# Patient Record
Sex: Male | Born: 1950 | Race: White | Hispanic: No | Marital: Single | Smoking: Former smoker
Health system: Southern US, Community
[De-identification: ages and names within clinical notes are randomized; demographics above are authoritative.]

## PROBLEM LIST (undated history)

## (undated) DIAGNOSIS — F329 Major depressive disorder, single episode, unspecified: Secondary | ICD-10-CM

## (undated) DIAGNOSIS — Z87442 Personal history of urinary calculi: Secondary | ICD-10-CM

## (undated) DIAGNOSIS — L2089 Other atopic dermatitis: Secondary | ICD-10-CM

## (undated) DIAGNOSIS — M545 Low back pain: Secondary | ICD-10-CM

## (undated) DIAGNOSIS — N259 Disorder resulting from impaired renal tubular function, unspecified: Secondary | ICD-10-CM

## (undated) DIAGNOSIS — N400 Enlarged prostate without lower urinary tract symptoms: Secondary | ICD-10-CM

## (undated) DIAGNOSIS — E785 Hyperlipidemia, unspecified: Secondary | ICD-10-CM

## (undated) DIAGNOSIS — R05 Cough: Secondary | ICD-10-CM

## (undated) DIAGNOSIS — R110 Nausea: Secondary | ICD-10-CM

## (undated) DIAGNOSIS — I10 Essential (primary) hypertension: Secondary | ICD-10-CM

## (undated) DIAGNOSIS — I639 Cerebral infarction, unspecified: Secondary | ICD-10-CM

## (undated) DIAGNOSIS — M79604 Pain in right leg: Secondary | ICD-10-CM

## (undated) DIAGNOSIS — G4700 Insomnia, unspecified: Secondary | ICD-10-CM

## (undated) DIAGNOSIS — R931 Abnormal findings on diagnostic imaging of heart and coronary circulation: Secondary | ICD-10-CM

## (undated) DIAGNOSIS — L80 Vitiligo: Secondary | ICD-10-CM

## (undated) DIAGNOSIS — Z860100 Personal history of colon polyps, unspecified: Secondary | ICD-10-CM

## (undated) DIAGNOSIS — G459 Transient cerebral ischemic attack, unspecified: Secondary | ICD-10-CM

## (undated) DIAGNOSIS — H269 Unspecified cataract: Secondary | ICD-10-CM

## (undated) DIAGNOSIS — M25569 Pain in unspecified knee: Secondary | ICD-10-CM

## (undated) DIAGNOSIS — E042 Nontoxic multinodular goiter: Secondary | ICD-10-CM

## (undated) DIAGNOSIS — I340 Nonrheumatic mitral (valve) insufficiency: Secondary | ICD-10-CM

## (undated) DIAGNOSIS — T7840XA Allergy, unspecified, initial encounter: Secondary | ICD-10-CM

## (undated) DIAGNOSIS — N183 Chronic kidney disease, stage 3 unspecified: Secondary | ICD-10-CM

## (undated) DIAGNOSIS — Z8619 Personal history of other infectious and parasitic diseases: Secondary | ICD-10-CM

## (undated) DIAGNOSIS — I7090 Unspecified atherosclerosis: Secondary | ICD-10-CM

## (undated) DIAGNOSIS — R109 Unspecified abdominal pain: Secondary | ICD-10-CM

## (undated) DIAGNOSIS — R739 Hyperglycemia, unspecified: Secondary | ICD-10-CM

## (undated) DIAGNOSIS — K5730 Diverticulosis of large intestine without perforation or abscess without bleeding: Secondary | ICD-10-CM

## (undated) DIAGNOSIS — M5412 Radiculopathy, cervical region: Secondary | ICD-10-CM

## (undated) DIAGNOSIS — M1990 Unspecified osteoarthritis, unspecified site: Secondary | ICD-10-CM

## (undated) DIAGNOSIS — J309 Allergic rhinitis, unspecified: Secondary | ICD-10-CM

## (undated) DIAGNOSIS — R6882 Decreased libido: Secondary | ICD-10-CM

## (undated) DIAGNOSIS — K649 Unspecified hemorrhoids: Secondary | ICD-10-CM

## (undated) DIAGNOSIS — R634 Abnormal weight loss: Secondary | ICD-10-CM

## (undated) HISTORY — DX: Chronic kidney disease, stage 3 unspecified: N18.30

## (undated) HISTORY — DX: Personal history of urinary calculi: Z87.442

## (undated) HISTORY — DX: Unspecified cataract: H26.9

## (undated) HISTORY — DX: Unspecified osteoarthritis, unspecified site: M19.90

## (undated) HISTORY — DX: Allergic rhinitis, unspecified: J30.9

## (undated) HISTORY — DX: Allergy, unspecified, initial encounter: T78.40XA

## (undated) HISTORY — DX: Cerebral infarction, unspecified: I63.9

## (undated) HISTORY — DX: Essential (primary) hypertension: I10

## (undated) HISTORY — DX: Cough: R05

## (undated) HISTORY — DX: Hyperglycemia, unspecified: R73.9

## (undated) HISTORY — DX: Decreased libido: R68.82

## (undated) HISTORY — DX: Benign prostatic hyperplasia without lower urinary tract symptoms: N40.0

## (undated) HISTORY — DX: Diverticulosis of large intestine without perforation or abscess without bleeding: K57.30

## (undated) HISTORY — DX: Other atopic dermatitis: L20.89

## (undated) HISTORY — DX: Unspecified atherosclerosis: I70.90

## (undated) HISTORY — DX: Disorder resulting from impaired renal tubular function, unspecified: N25.9

## (undated) HISTORY — PX: COLONOSCOPY: SHX174

## (undated) HISTORY — DX: Insomnia, unspecified: G47.00

## (undated) HISTORY — DX: Pain in unspecified knee: M25.569

## (undated) HISTORY — DX: Nonrheumatic mitral (valve) insufficiency: I34.0

## (undated) HISTORY — DX: Personal history of other infectious and parasitic diseases: Z86.19

## (undated) HISTORY — DX: Nontoxic multinodular goiter: E04.2

## (undated) HISTORY — DX: Radiculopathy, cervical region: M54.12

## (undated) HISTORY — PX: OTHER SURGICAL HISTORY: SHX169

## (undated) HISTORY — DX: Hyperlipidemia, unspecified: E78.5

## (undated) HISTORY — DX: Abnormal weight loss: R63.4

## (undated) HISTORY — DX: Major depressive disorder, single episode, unspecified: F32.9

## (undated) HISTORY — DX: Nausea: R11.0

## (undated) HISTORY — PX: POLYPECTOMY: SHX149

## (undated) HISTORY — DX: Low back pain: M54.5

## (undated) HISTORY — DX: Unspecified hemorrhoids: K64.9

## (undated) HISTORY — PX: TONSILLECTOMY: SHX5217

## (undated) HISTORY — DX: Vitiligo: L80

## (undated) HISTORY — DX: Personal history of colon polyps, unspecified: Z86.0100

## (undated) HISTORY — DX: Pain in right leg: M79.604

## (undated) HISTORY — DX: Personal history of colonic polyps: Z86.010

## (undated) HISTORY — DX: Abnormal findings on diagnostic imaging of heart and coronary circulation: R93.1

## (undated) HISTORY — DX: Unspecified abdominal pain: R10.9

---

## 2001-03-13 ENCOUNTER — Ambulatory Visit (HOSPITAL_COMMUNITY): Admission: RE | Admit: 2001-03-13 | Discharge: 2001-03-13 | Payer: Self-pay | Admitting: Internal Medicine

## 2001-03-13 ENCOUNTER — Encounter: Payer: Self-pay | Admitting: Internal Medicine

## 2001-12-03 ENCOUNTER — Emergency Department (HOSPITAL_COMMUNITY): Admission: EM | Admit: 2001-12-03 | Discharge: 2001-12-03 | Payer: Self-pay | Admitting: Emergency Medicine

## 2001-12-03 ENCOUNTER — Encounter: Payer: Self-pay | Admitting: Urology

## 2001-12-05 ENCOUNTER — Encounter: Payer: Self-pay | Admitting: Urology

## 2001-12-05 ENCOUNTER — Ambulatory Visit (HOSPITAL_BASED_OUTPATIENT_CLINIC_OR_DEPARTMENT_OTHER): Admission: RE | Admit: 2001-12-05 | Discharge: 2001-12-05 | Payer: Self-pay | Admitting: Urology

## 2002-11-07 ENCOUNTER — Encounter: Payer: Self-pay | Admitting: Urology

## 2002-11-07 ENCOUNTER — Ambulatory Visit (HOSPITAL_BASED_OUTPATIENT_CLINIC_OR_DEPARTMENT_OTHER): Admission: RE | Admit: 2002-11-07 | Discharge: 2002-11-07 | Payer: Self-pay | Admitting: Urology

## 2004-09-02 ENCOUNTER — Ambulatory Visit: Payer: Self-pay | Admitting: Internal Medicine

## 2005-02-16 ENCOUNTER — Ambulatory Visit: Payer: Self-pay | Admitting: Internal Medicine

## 2005-05-25 ENCOUNTER — Ambulatory Visit: Payer: Self-pay | Admitting: Internal Medicine

## 2005-12-15 ENCOUNTER — Ambulatory Visit: Payer: Self-pay | Admitting: Internal Medicine

## 2006-06-27 ENCOUNTER — Ambulatory Visit: Payer: Self-pay | Admitting: Internal Medicine

## 2006-06-29 ENCOUNTER — Ambulatory Visit: Payer: Self-pay | Admitting: Cardiology

## 2006-07-18 ENCOUNTER — Ambulatory Visit: Payer: Self-pay | Admitting: Internal Medicine

## 2006-09-01 ENCOUNTER — Encounter (INDEPENDENT_AMBULATORY_CARE_PROVIDER_SITE_OTHER): Payer: Self-pay | Admitting: *Deleted

## 2006-09-01 ENCOUNTER — Ambulatory Visit: Payer: Self-pay | Admitting: Internal Medicine

## 2007-04-06 ENCOUNTER — Ambulatory Visit: Payer: Self-pay | Admitting: Internal Medicine

## 2007-04-08 DIAGNOSIS — J309 Allergic rhinitis, unspecified: Secondary | ICD-10-CM

## 2007-04-08 DIAGNOSIS — G4700 Insomnia, unspecified: Secondary | ICD-10-CM

## 2007-04-08 DIAGNOSIS — F329 Major depressive disorder, single episode, unspecified: Secondary | ICD-10-CM

## 2007-04-08 DIAGNOSIS — F32A Depression, unspecified: Secondary | ICD-10-CM

## 2007-04-08 HISTORY — DX: Depression, unspecified: F32.A

## 2007-04-08 HISTORY — DX: Allergic rhinitis, unspecified: J30.9

## 2007-04-08 HISTORY — DX: Insomnia, unspecified: G47.00

## 2007-05-02 ENCOUNTER — Ambulatory Visit: Payer: Self-pay | Admitting: Internal Medicine

## 2007-08-03 ENCOUNTER — Telehealth (INDEPENDENT_AMBULATORY_CARE_PROVIDER_SITE_OTHER): Payer: Self-pay | Admitting: *Deleted

## 2007-08-05 DIAGNOSIS — N400 Enlarged prostate without lower urinary tract symptoms: Secondary | ICD-10-CM

## 2007-08-05 DIAGNOSIS — Z87442 Personal history of urinary calculi: Secondary | ICD-10-CM

## 2007-08-05 DIAGNOSIS — Z860100 Personal history of colon polyps, unspecified: Secondary | ICD-10-CM

## 2007-08-05 DIAGNOSIS — K5730 Diverticulosis of large intestine without perforation or abscess without bleeding: Secondary | ICD-10-CM

## 2007-08-05 DIAGNOSIS — E785 Hyperlipidemia, unspecified: Secondary | ICD-10-CM | POA: Insufficient documentation

## 2007-08-05 HISTORY — DX: Personal history of urinary calculi: Z87.442

## 2007-08-05 HISTORY — DX: Diverticulosis of large intestine without perforation or abscess without bleeding: K57.30

## 2007-08-05 HISTORY — DX: Personal history of colon polyps, unspecified: Z86.0100

## 2007-08-05 HISTORY — DX: Benign prostatic hyperplasia without lower urinary tract symptoms: N40.0

## 2007-08-05 HISTORY — DX: Personal history of colonic polyps: Z86.010

## 2007-11-20 ENCOUNTER — Ambulatory Visit: Payer: Self-pay | Admitting: Internal Medicine

## 2007-11-29 ENCOUNTER — Encounter: Payer: Self-pay | Admitting: Internal Medicine

## 2008-01-01 ENCOUNTER — Encounter: Payer: Self-pay | Admitting: Internal Medicine

## 2008-05-01 ENCOUNTER — Encounter: Payer: Self-pay | Admitting: Internal Medicine

## 2008-07-10 ENCOUNTER — Ambulatory Visit: Payer: Self-pay | Admitting: Internal Medicine

## 2008-07-10 DIAGNOSIS — K649 Unspecified hemorrhoids: Secondary | ICD-10-CM

## 2008-07-10 DIAGNOSIS — M545 Low back pain, unspecified: Secondary | ICD-10-CM

## 2008-07-10 DIAGNOSIS — M25569 Pain in unspecified knee: Secondary | ICD-10-CM

## 2008-07-10 HISTORY — DX: Unspecified hemorrhoids: K64.9

## 2008-07-10 HISTORY — DX: Pain in unspecified knee: M25.569

## 2008-07-10 HISTORY — DX: Low back pain, unspecified: M54.50

## 2008-11-18 ENCOUNTER — Encounter: Payer: Self-pay | Admitting: Internal Medicine

## 2009-05-14 ENCOUNTER — Telehealth: Payer: Self-pay | Admitting: Internal Medicine

## 2009-05-18 ENCOUNTER — Ambulatory Visit: Payer: Self-pay | Admitting: Internal Medicine

## 2009-06-26 ENCOUNTER — Encounter: Payer: Self-pay | Admitting: Internal Medicine

## 2009-07-13 ENCOUNTER — Ambulatory Visit: Payer: Self-pay | Admitting: Internal Medicine

## 2009-07-13 LAB — HM COLONOSCOPY

## 2009-07-14 ENCOUNTER — Encounter (INDEPENDENT_AMBULATORY_CARE_PROVIDER_SITE_OTHER): Payer: Self-pay | Admitting: *Deleted

## 2009-07-22 ENCOUNTER — Telehealth: Payer: Self-pay | Admitting: Internal Medicine

## 2009-07-22 ENCOUNTER — Encounter: Payer: Self-pay | Admitting: Internal Medicine

## 2009-11-12 ENCOUNTER — Ambulatory Visit: Payer: Self-pay | Admitting: Internal Medicine

## 2009-11-12 DIAGNOSIS — L2089 Other atopic dermatitis: Secondary | ICD-10-CM

## 2009-11-12 DIAGNOSIS — R05 Cough: Secondary | ICD-10-CM

## 2009-11-12 DIAGNOSIS — R059 Cough, unspecified: Secondary | ICD-10-CM

## 2009-11-12 HISTORY — DX: Other atopic dermatitis: L20.89

## 2009-11-12 HISTORY — DX: Cough, unspecified: R05.9

## 2009-11-12 LAB — CONVERTED CEMR LAB: Rapid Strep: NEGATIVE

## 2010-01-07 ENCOUNTER — Encounter: Payer: Self-pay | Admitting: Internal Medicine

## 2010-01-28 ENCOUNTER — Telehealth: Payer: Self-pay | Admitting: Internal Medicine

## 2010-02-18 ENCOUNTER — Encounter: Payer: Self-pay | Admitting: Internal Medicine

## 2010-02-19 ENCOUNTER — Telehealth: Payer: Self-pay | Admitting: Internal Medicine

## 2010-06-30 ENCOUNTER — Telehealth: Payer: Self-pay | Admitting: Internal Medicine

## 2010-08-26 ENCOUNTER — Encounter: Payer: Self-pay | Admitting: Internal Medicine

## 2010-09-07 ENCOUNTER — Encounter: Payer: Self-pay | Admitting: Internal Medicine

## 2010-09-07 ENCOUNTER — Ambulatory Visit: Payer: Self-pay | Admitting: Internal Medicine

## 2010-09-07 DIAGNOSIS — N259 Disorder resulting from impaired renal tubular function, unspecified: Secondary | ICD-10-CM

## 2010-09-07 HISTORY — DX: Disorder resulting from impaired renal tubular function, unspecified: N25.9

## 2010-10-11 ENCOUNTER — Ambulatory Visit (HOSPITAL_COMMUNITY)
Admission: RE | Admit: 2010-10-11 | Discharge: 2010-10-11 | Payer: Self-pay | Source: Home / Self Care | Attending: Urology | Admitting: Urology

## 2010-10-13 ENCOUNTER — Encounter: Payer: Self-pay | Admitting: Internal Medicine

## 2010-10-26 NOTE — Progress Notes (Signed)
  Phone Note From Pharmacy   [facility] Summary of Call: Received PA approval letter  for pravastatin stating that rx approved 02/18/10-03/20/10 Initial call taken by: Rock Nephew CMA,  Feb 19, 2010 10:00 AM

## 2010-10-26 NOTE — Assessment & Plan Note (Signed)
Summary: REQUEST AM APPT-FEVER X 3 DYS-DR [facility] PT-[facility]   Vital Signs:  Patient profile:   60 year old male Height:      67 inches Weight:      161 pounds BMI:     25.31 O2 Sat:      98 % on Room air Temp:     100.1 degrees F oral Pulse rate:   86 / minute Pulse rhythm:   regular Resp:     16 per minute BP sitting:   108 / 64  (left arm) Cuff size:   large  Vitals Entered By: Rock Nephew CMA (November 12, 2009 8:49 AM)  Nutrition Counseling: Patient's BMI is greater than 25 and therefore counseled on weight management options.  O2 Flow:  Room air CC: fever x 3 days w/ sore throat, URI symptoms Is Patient Diabetic? No Pain Assessment Patient in pain? yes     Location: throat   Primary Care Provider:  Corwin Levins MD  CC:  fever x 3 days w/ sore throat and URI symptoms.  History of Present Illness:  URI Symptoms      This is a right-handed patient who presents with URI symptoms.  The symptoms began 4 days ago.  The severity is described as moderate.  The patient reports sore throat, dry cough, and sick contacts, but denies nasal congestion, clear nasal discharge, purulent nasal discharge, productive cough, and earache.  Associated symptoms include fever of 100.5-103 degrees.  The patient denies stiff neck, dyspnea, wheezing, rash, vomiting, diarrhea, and use of an antipyretic.  The patient denies headache, muscle aches, and severe fatigue.  The patient denies the following risk factors for Strep sinusitis: unilateral facial pain, unilateral nasal discharge, double sickening, Strep exposure, tender adenopathy, and absence of cough.    He has not been taking allegra and has persistent rash/itching.  Preventive Screening-Counseling & Management  Alcohol-Tobacco     Alcohol drinks/day: 0     Smoking Status: never  Caffeine-Diet-Exercise     Does Patient Exercise: no  Hep-HIV-STD-Contraception     Hepatitis Risk: no risk noted     HIV Risk: no risk noted     STD Risk:  no risk noted      Drug Use:  never.    Medications Prior to Update: 1)  Fexofenadine Hcl 180 Mg  Tabs (Fexofenadine Hcl) .Marland Kitchen.. 1 By Mouth Once Daily Prn 2)  Fluticasone Propionate 50 Mcg/act  Susp (Fluticasone Propionate) 3)  Fluocinonide 0.05 % Crea (Fluocinonide) .... Use Asd Two Times A Day As Needed 4)  Pravastatin Sodium 80 Mg Tabs (Pravastatin Sodium) .Marland Kitchen.. 1po Once Daily 5)  Metrogel 1 % Gel (Metronidazole) .... Use Asd Once Daily To Face As Needed 6)  Adult Aspirin Ec Low Strength 81 Mg Tbec (Aspirin) .Marland Kitchen.. 1 By Mouth Once Daily 7)  Anusol-Hc 25 Mg Supp (Hydrocortisone Acetate) .Marland Kitchen.. 1 Pr Two Times A Day 8)  Atopiclair  Crea (Emollient) .... Apply To Rash Two Times A Day 9)  Hydrocortisone 1 % Crea (Hydrocortisone) .... (Otc) Use Asd On Box 10)  Multivitamin .... Daily 11)  Clobetasol Propionate 0.05 % Soln (Clobetasol Propionate) .... Use Foam As Needed To Rash 12)  Aloquin 1.25-1 % Gel (Iodoquinol-Aloe Polysaccharide) .... As Needed Rash  Current Medications (verified): 1)  Fexofenadine Hcl 180 Mg  Tabs (Fexofenadine Hcl) .Marland Kitchen.. 1 By Mouth Once Daily Prn 2)  Fluticasone Propionate 50 Mcg/act  Susp (Fluticasone Propionate) 3)  Fluocinonide 0.05 % Crea (Fluocinonide) .... Use  Asd Two Times A Day As Needed 4)  Pravastatin Sodium 80 Mg Tabs (Pravastatin Sodium) .Marland Kitchen.. 1po Once Daily 5)  Metrogel 1 % Gel (Metronidazole) .... Use Asd Once Daily To Face As Needed 6)  Adult Aspirin Ec Low Strength 81 Mg Tbec (Aspirin) .Marland Kitchen.. 1 By Mouth Once Daily 7)  Atopiclair  Crea (Emollient) .... Apply To Rash Two Times A Day 8)  Multivitamin .... Daily 9)  Clobetasol Propionate 0.05 % Soln (Clobetasol Propionate) .... Use Foam As Needed To Rash  Allergies (verified): 1)  ! Adhesive Tape 2)  Lipitor 3)  Zocor 4)  * Crestor  Past History:  Past Medical History: Reviewed history from 08/05/2007 and no changes required. Depression Insomnia Allergic rhinitis Hyperlipidemia Nephrolithiasis, hx  of Benign prostatic hypertrophy Diverticulosis, colon Colonic polyps, hx of  Past Surgical History: Reviewed history from 07/13/2009 and no changes required. Tonsillectomy s/p lithotrypsy  Family History: Reviewed history from 08/05/2007 and no changes required. sister with ulcerative colitis grandparents with heart disease Family History of CAD Male 1st degree relative Family History High cholesterol Family History Hypertension  Social History: Reviewed history from 07/13/2009 and no changes required. Former Smoker work - [employer] Divorced no biological children Alcohol use-no Regular exercise-no Hepatitis Risk:  no risk noted HIV Risk:  no risk noted STD Risk:  no risk noted Drug Use:  never Smoking Status:  never Does Patient Exercise:  no  Review of Systems       The patient complains of fever.  The patient denies anorexia, weight loss, chest pain, headaches, hemoptysis, abdominal pain, melena, hematochezia, severe indigestion/heartburn, enlarged lymph nodes, and angioedema.   Derm:  Complains of itching and rash; denies dryness, flushing, hair loss, insect bite(s), lesion(s), and poor wound healing.  Physical Exam  General:  alert and well-developed.  well-nourished, well-hydrated, appropriate dress, and normal appearance.   Ears:  R ear normal and L ear normal.   Nose:  External nasal examination shows no deformity or inflammation. Nasal mucosa are pink and moist without lesions or exudates. Mouth:  no exudates, no posterior lymphoid hypertrophy, no postnasal drip, no pharyngeal crowing, no lesions, no aphthous ulcers, no erosions, no tongue abnormalities, no leukoplakia, no petechiae, and pharyngeal erythema.   Neck:  supple, full ROM, no masses, no thyromegaly, no JVD, no carotid bruits, and no cervical lymphadenopathy.   Lungs:  Normal respiratory effort, chest expands symmetrically. Lungs are clear to auscultation, no crackles or wheezes. Heart:  Normal rate and  regular rhythm. S1 and S2 normal without gallop, murmur, click, rub or other extra sounds. Abdomen:  soft, non-tender, normal bowel sounds, no distention, no masses, no guarding, no rigidity, no rebound tenderness, no hepatomegaly, and no splenomegaly.   Msk:  No deformity or scoliosis noted of thoracic or lumbar spine.   Pulses:  R and L carotid,radial,femoral,dorsalis pedis and posterior tibial pulses are full and equal bilaterally Extremities:  No clubbing, cyanosis, edema, or deformity noted with normal full range of motion of all joints.   Neurologic:  No cranial nerve deficits noted. Station and gait are normal. Plantar reflexes are down-going bilaterally. DTRs are symmetrical throughout. Sensory, motor and coordinative functions appear intact. Skin:  he has coalesced papules with scale across torso and upper extremities with no plaques, macules, targets, vesicles,  there is some dermagraphism and lichenification. Cervical Nodes:  no anterior cervical adenopathy and no posterior cervical adenopathy.   Axillary Nodes:  no R axillary adenopathy and no L axillary adenopathy.   Inguinal  Nodes:  no R inguinal adenopathy and no L inguinal adenopathy.     Impression & Recommendations:  Problem # 1:  ECZEMA, ATOPIC (ICD-691.8) Assessment New restart allegra The following medications were removed from the medication list:    Hydrocortisone 1 % Crea (Hydrocortisone) ..... (otc) use asd on box His updated medication list for this problem includes:    Fluocinonide 0.05 % Crea (Fluocinonide) ..... Use asd two times a day as needed    Atopiclair Crea (Emollient) .Marland Kitchen... Apply to rash two times a day    Clobetasol Propionate 0.05 % Soln (Clobetasol propionate) ..... Use foam as needed to rash  Discussed use of medication and avoidance of irritating agents. Also stressed importance of moisturizers.   Problem # 2:  FEVER (ICD-780.60) Assessment: New  Orders: Rapid Strep (40981)  Discussed fever  control and symptomatic treatment.   Problem # 3:  PHARYNGITIS-ACUTE (ICD-462) Assessment: New will cover strep His updated medication list for this problem includes:    Adult Aspirin Ec Low Strength 81 Mg Tbec (Aspirin) .Marland Kitchen... 1 by mouth once daily    Zithromax Tri-pak 500 Mg Tab (Azithromycin) .Marland Kitchen... Take one by mouth once daily for 3 days  Orders: Rapid Strep (19147)  Problem # 4:  COUGH (ICD-786.2) Assessment: New will look for pna but I think this is viral Orders: T-2 View CXR (71020TC)  Complete Medication List: 1)  Fexofenadine Hcl 180 Mg Tabs (Fexofenadine hcl) .Marland Kitchen.. 1 by mouth once daily prn 2)  Fluticasone Propionate 50 Mcg/act Susp (Fluticasone propionate) 3)  Fluocinonide 0.05 % Crea (Fluocinonide) .... Use asd two times a day as needed 4)  Pravastatin Sodium 80 Mg Tabs (Pravastatin sodium) .Marland Kitchen.. 1po once daily 5)  Metrogel 1 % Gel (Metronidazole) .... Use asd once daily to face as needed 6)  Adult Aspirin Ec Low Strength 81 Mg Tbec (Aspirin) .Marland Kitchen.. 1 by mouth once daily 7)  Atopiclair Crea (Emollient) .... Apply to rash two times a day 8)  Multivitamin  .... Daily 9)  Clobetasol Propionate 0.05 % Soln (Clobetasol propionate) .... Use foam as needed to rash 10)  Zithromax Tri-pak 500 Mg Tab (Azithromycin) .... Take one by mouth once daily for 3 days 11)  Guiatuss Ac 100-10 Mg/7ml Syrp (Guaifenesin-codeine) .... 5-10 ml by mouth qid as needed for cough  Patient Instructions: 1)  Please schedule a follow-up appointment in 2 weeks. 2)  Get plenty of rest, drink lots of clear liquids, and use Tylenol or Ibuprofen for fever and comfort. Return in 7-10 days if you're not better:sooner if you're feeling worse. 3)  Take over the counter cough and cold medications only as directed on the package insert. DO NOT take more than recommended . 4)  Take your antibiotic as prescribed until ALL of it is gone, but stop if you develop a rash or swelling and contact our office as soon as  possible. Prescriptions: FEXOFENADINE HCL 180 MG  TABS (FEXOFENADINE HCL) 1 by mouth once daily prn  #30 x 11   Entered and Authorized by:   Etta Grandchild MD   Signed by:   Etta Grandchild MD on 11/12/2009   Method used:   Electronically to        [facility]. [phone]* (retail)       [address]       [place], [place]  [address]       Ph: [phone]  Fax: [phone]   RxID:   [id number] GUIATUSS AC 100-10 MG/5ML SYRP (GUAIFENESIN-CODEINE) 5-10 ml by mouth QID as needed for cough  #6 ounces x 0   Entered and Authorized by:   Etta Grandchild MD   Signed by:   Etta Grandchild MD on 11/12/2009   Method used:   Print then Give to Patient   RxID:   [id number] ZITHROMAX TRI-PAK 500 MG TAB (AZITHROMYCIN) Take one by mouth once daily for 3 days  #3 x 0   Entered and Authorized by:   Etta Grandchild MD   Signed by:   Etta Grandchild MD on 11/12/2009   Method used:   Print then Give to Patient   RxID:   [id number]    Laboratory Results    Other Tests  Rapid Strep: negative

## 2010-10-26 NOTE — Letter (Signed)
Summary: [facility]  [facility]   Imported By: Lennie Odor 08/30/2010 14:01:57  _____________________________________________________________________  External Attachment:    Type:   Image     Comment:   External Document

## 2010-10-26 NOTE — Letter (Signed)
Summary: [facility]  [facility]   Imported By: Sherian Rein 02/03/2010 07:51:16  _____________________________________________________________________  External Attachment:    Type:   Image     Comment:   External Document

## 2010-10-26 NOTE — Progress Notes (Signed)
Summary: Pravastatin Rx  Phone Note Call from Patient   Summary of Call: Patient must now use mail order and needs new Pravastatin prescription. Mailed per patient request. Initial call taken by: Lucious Groves,  Jan 28, 2010 2:25 PM    Prescriptions: PRAVASTATIN SODIUM 80 MG TABS (PRAVASTATIN SODIUM) 1po once daily  #90 x 3   Entered by:   Lucious Groves   Authorized by:   Corwin Levins MD   Signed by:   Lucious Groves on 01/28/2010   Method used:   Print then Mail to Patient   RxID:   [id number]

## 2010-10-26 NOTE — Medication Information (Signed)
Summary: Appro[facility]  [facility]val/[facility]   Imported By: Lester DeForest 02/24/2010 08:52:33  _____________________________________________________________________  External Attachment:    Type:   Image     Comment:   External Document

## 2010-10-26 NOTE — Progress Notes (Signed)
Summary: Lab order  Phone Note Call from Patient Call back at Home Phone [phone]   Caller: Patient Summary of Call: Pt called stating he has appt for CPX 12/19. Pt is requesting order for CPX labs be mailed to his home so he can have labs done elsewhere. Initial call taken by: Margaret Pyle, CMA,  June 30, 2010 10:17 AM  Follow-up for Phone Call        done hardcopy to LIM side B - dahlia  Follow-up by: Corwin Levins MD,  June 30, 2010 10:28 AM  Additional Follow-up for Phone Call Additional follow up Details #1::        Labs written and mailed to pt. Additional Follow-up by: Margaret Pyle, CMA,  June 30, 2010 10:33 AM

## 2010-10-28 NOTE — Letter (Signed)
Summary: [facility]  [facility]   Imported By: Lennie Odor 10/20/2010 14:31:51  _____________________________________________________________________  External Attachment:    Type:   Image     Comment:   External Document

## 2010-10-28 NOTE — Assessment & Plan Note (Signed)
Summary: CPX /[facility]  #  GOING TO [facility] FOR LABS   Vital Signs:  Patient profile:   60 year old male Height:      67 inches Weight:      167 pounds BMI:     26.25 O2 Sat:      99 % on Room air Temp:     97.4 degrees F oral Pulse rate:   59 / minute BP sitting:   112 / 70  (left arm) Cuff size:   regular  Vitals Entered By: Zella Ball Ewing CMA Duncan Dull) (September 07, 2010 8:37 AM)  O2 Flow:  Room air  CC: Adult Physical/RE   Primary Care Provider:  Corwin Levins MD  CC:  Adult Physical/RE.  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 so stopped;  has been taking leftover welchol pills off and on up to 6 per day but missed mult doses, for the month of november;  Pt denies CP, worsening sob, doe, wheezing, orthopnea, pnd, worsening LE edema, palps, dizziness or syncope  Pt denies new neuro symptoms such as headache, facial or extremity weakness  Pt denies polydipsia, polyuria, Overall good compliance with meds, trying to follow low chol diet, wt stable, little excercise however No fever, wt loss, night sweats, loss of appetite or other constitutional symptoms  Denies worsening depressive symptoms, suicidal ideation, or panic.  Overall good compliance with meds, and good tolerability.  Pt states good ability with ADL's, low fall risk, home safety reviewed and adequate, no significant change in hearing or vision, trying to follow lower chol diet, and occasionally active only with regular excercise.   Preventive Screening-Counseling & Management      Drug Use:  no.    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 1 By Mouth Once Daily Prn 2)  Fluticasone Propionate 50 Mcg/act  Susp (Fluticasone Propionate) 3)  Fluocinonide 0.05 % Crea (Fluocinonide) .... Use Asd Two Times A Day As Needed 4)  Pravastatin Sodium 80 Mg Tabs (Pravastatin Sodium) .Marland Kitchen.. 1po Once Daily 5)  Metrogel 1 % Gel (Metronidazole) .... Use Asd Once Daily To Face As Needed 6)  Adult Aspirin Ec Low Strength 81 Mg Tbec (Aspirin) .Marland Kitchen.. 1 By Mouth Once Daily 7)  Atopiclair  Crea (Emollient) .... Apply To Rash Two Times A Day 8)  Multivitamin .... Daily 9)  Clobetasol Propionate 0.05 % Soln (Clobetasol Propionate) .... Use Foam As Needed To Rash 10)  Guiatuss Ac 100-10 Mg/1ml Syrp (Guaifenesin-Codeine) .... 5-10 Ml By Mouth Qid As Needed For Cough  Current Medications (verified): 1)  Fexofenadine Hcl 180 Mg  Tabs (Fexofenadine Hcl) .Marland Kitchen.. 1 By Mouth Once Daily Prn 2)  Fluticasone Propionate 50 Mcg/act  Susp (Fluticasone  Propionate) 3)  Fluocinonide 0.05 % Crea (Fluocinonide) .... Use Asd Two Times A Day As Needed 4)  Welchol 3.75 Gm Pack (Colesevelam Hcl) .Marland Kitchen.. 1 Pack By Mouth Once Daily in 8 Oz Water 5)  Metrogel 1 % Gel (Metronidazole) .... Use Asd Once Daily To Face As Needed 6)  Adult Aspirin Ec Low Strength 81 Mg Tbec (Aspirin) .Marland Kitchen.. 1 By Mouth Once Daily 7)  Atopiclair  Crea (Emollient) .... Apply To Rash Two Times A Day 8)  Multivitamin .... Daily 9)  Clobetasol Propionate 0.05 % Soln (Clobetasol Propionate) .... Use Foam As Needed To Rash  Allergies (verified): 1)  ! Adhesive Tape 2)  Lipitor 3)  Zocor 4)  * Crestor 5)  * Pravastatin  Past  History:  Past Surgical History: Last updated: 07/13/2009 Tonsillectomy s/p lithotrypsy  Family History: Last updated: 08/05/2007 sister with ulcerative colitis grandparents with heart disease Family History of CAD Male 1st degree relative Family History High cholesterol Family History Hypertension  Social History: Last updated: 09/07/2010 Former Smoker work - [employer] Divorced no biological children Alcohol use-no Regular exercise-no Drug use-no  Risk Factors: Alcohol Use: 0 (11/12/2009) Exercise: no (11/12/2009)  Risk Factors: Smoking Status: never (11/12/2009)  Past Medical History: Depression Insomnia Allergic rhinitis Hyperlipidemia Nephrolithiasis, hx of Benign prostatic hypertrophy Diverticulosis, colon Colonic polyps, hx of hx of shingles - 1980's Renal insufficiency  Social History: Former Smoker work - [employer] Divorced no biological children Alcohol use-no Regular exercise-no Drug use-no Drug Use:  no  Review of Systems  The patient denies anorexia, fever, vision loss, decreased hearing, hoarseness, chest pain, syncope, dyspnea on exertion, peripheral edema, prolonged cough, headaches, hemoptysis, abdominal pain, melena, hematochezia, severe indigestion/heartburn, hematuria, muscle weakness, suspicious skin lesions, transient blindness, difficulty walking, depression, unusual weight change, abnormal bleeding, enlarged lymph nodes, and angioedema.         all otherwise negative per pt -  except due for lithotrypsy for jan 2012 for right stone x 2  Physical Exam  General:  alert and overweight-appearing - very mild Head:  normocephalic and atraumatic.   Eyes:  vision grossly intact, pupils equal, and pupils round.   Ears:  R ear normal and L ear normal.   Nose:  no external deformity and no nasal discharge.   Mouth:  no gingival abnormalities and pharynx pink and moist.   Neck:  supple and no masses.   Lungs:  normal respiratory effort and  normal breath sounds.   Heart:  normal rate and regular rhythm.   Abdomen:  soft, non-tender, and normal bowel sounds.   Msk:  no joint tenderness and no joint swelling.   Extremities:  no edema, no erythema  Neurologic:  strength normal in all extremities, sensation intact to light touch, and gait normal.   Skin:  color normal.  with plaquelike rash to right upper chest approx 3 cm area, nontender, nonraised nonvesicular Psych:  not anxious appearing and not depressed appearing.     Impression & Recommendations:  Problem # 1:  Preventive Health Care (ICD-V70.0) Overall doing well, age appropriate education and counseling updated, referral for preventive services and immunizations addressed, dietary counseling and smoking status adressed , most recent labs reviewed, ecg reviewed I have personally reviewed and have noted 1.The patient's medical and social history 2.Their use of alcohol, tobacco or illicit drugs 3.Their current medications and supplements 4. Functional ability including ADL's, fall risk, home safety risk, hearing & visual impairment  5.Diet and physical activities 6.Evidence for depression or mood  disorders The patients weight, height, BMI  have been recorded in the chart I have made referrals, counseling and provided education to the patient based review of the above  Orders: EKG w/ Interpretation (93000)  Problem # 2:  HYPERLIPIDEMIA (ICD-272.4)  His updated medication list for this problem includes:    Welchol 3.75 Gm Pack (Colesevelam hcl) .Marland Kitchen... 1 pack by mouth once daily in 8 oz water treat as above, f/u any worsening signs or symptoms , f/u lab in 6 mo at [employer] where he works  Problem # 3:  RENAL INSUFFICIENCY (ICD-588.9) mild, for f/u with labs in 66mo as well   Complete Medication List: 1)  Fexofenadine Hcl 180 Mg Tabs (Fexofenadine hcl) .Marland Kitchen.. 1 by mouth once daily prn 2)  Fluticasone Propionate 50 Mcg/act Susp (Fluticasone propionate) 3)  Fluocinonide  0.05 % Crea (Fluocinonide) .... Use asd two times a day as needed 4)  Welchol 3.75 Gm Pack (Colesevelam hcl) .Marland Kitchen.. 1 pack by mouth once daily in 8 oz water 5)  Metrogel 1 % Gel (Metronidazole) .... Use asd once daily to face as needed 6)  Adult Aspirin Ec Low Strength 81 Mg Tbec (Aspirin) .Marland Kitchen.. 1 by mouth once daily 7)  Atopiclair Crea (Emollient) .... Apply to rash two times a day 8)  Multivitamin  .... Daily 9)  Clobetasol Propionate 0.05 % Soln (Clobetasol propionate) .... Use foam as needed to rash  Patient Instructions: 1)  you had the shingles shot today 2)  stop the pravachol as you have 3)  start the welchol at one pack per day in water 4)  please repeat the lab work in 6 months (see attached order) 5)  Continue all previous medications as before this visit , including the Aspirin daily (remember it should be a Coated aspirin) 6)  Please keep your followup appts with dermatology and urology as planned 7)  Please schedule a follow-up appointment in 1 year, or sooner if needed Prescriptions: WELCHOL 3.75 GM PACK (COLESEVELAM HCL) 1 pack by mouth once daily in 8 oz water  #90 x 3   Entered and Authorized by:   Corwin Levins MD   Signed by:   Corwin Levins MD on 09/07/2010   Method used:   Print then Give to Patient   RxID:   [id number]    Orders Added: 1)  EKG w/ Interpretation [93000] 2)  Est. Patient 40-64 years [46962]  Appended Document: Immunization Entry      Immunizations Administered:  Zostavax # 1:    Vaccine Type: Zostavax    Site: right deltoid    Mfr: Merck    Dose: 0.5 ml    Route: Evans    Given by: Zella Ball Ewing CMA (AAMA)    Exp. Date: 04/30/2011    Lot #: [id number]    VIS given: 07/08/05 given September 07, 2010.

## 2010-11-01 ENCOUNTER — Encounter: Payer: Self-pay | Admitting: Internal Medicine

## 2010-11-17 NOTE — Letter (Signed)
[facility]   Imported By: Sherian Rein 11/09/2010 08:04:38  _____________________________________________________________________  External Attachment:    Type:   Image     Comment:   External Document

## 2011-02-01 ENCOUNTER — Other Ambulatory Visit: Payer: Self-pay

## 2011-02-01 MED ORDER — FLUTICASONE PROPIONATE 50 MCG/ACT NA SUSP: 2.0000 | Freq: Every day | NASAL | Status: DC

## 2011-02-01 NOTE — Telephone Encounter (Signed)
Pharmacy called requesting refill of nasal spray. Last OV, CPX 08/2010

## 2011-02-11 NOTE — Op Note (Signed)
[facility]  Patient:    Jerry Rios, Jerry Rios Visit Number: [id number] MRN: [id number]          Service Type: NES Location: NESC Attending Physician:  Monica Becton Dictated by:   Claudette Laws, M.D. Proc. Date: 12/05/01 Admit Date:  12/05/2001                             Operative Report  PREOPERATIVE DIAGNOSES:  1. Distal left ureteral calculus.  2. Left hydronephrosis and renal colic.  POSTOPERATIVE DIAGNOSES:  1. Distal left ureteral calculus.  2. Left hydronephrosis and renal colic.  OPERATION PERFORMED:  Cystoscopy, left rigid ureteroscopy with holmium laser lithotripsy, left ureteral calculus, and insertion of a 6 French 26 cm double J stent.  SURGEON:  Claudette Laws, M.D.  DESCRIPTION OF PROCEDURE:  The patient was prepped and draped in the dorsal lithotomy position under intubated general anesthesia. Cystoscopy was performed with a 22 French rigid cystoscope. He had a normal anterior urethra, a small prostate, some elevation of the posterior lip otherwise a normal bladder, smooth, no tumors, no calculi, normal ureteral orifices.  Initially I passed up a 0.038 Glidewire through a 6 [place] open ended ureteral catheter using fluoroscopic control. I tried to pass the open ended catheter past the stone but it was impacted. At this point then I removed the cystoscope and introduced a 6 1/2 [place] short rigid 7 degree ureteroscope under direct vision and I was able to intubate the ureter and engage the stone. Appropriate pictures were taken. Then using the thin laser fiber and the VersaPulse holmium laser, lithotripsy was performed upon the stone breaking it into multiple fine pieces. Careful inspection then of the ureter revealed no obvious perforation. There was some edema and erythema where the stone had impacted in the ureteral wall. We then backed out the ureteroscope and I back loaded the Glidewire through the cystoscope and  then passed up a 6 French 26 cm double J stent up the ureter without any difficulty. The proximal end was curled up in the renal pelvis, the distal end was curled up in the bladder. The bladder was emptied, all instruments were removed. I injected 10 cc of xylocaine jelly per urethra for anesthetic purposes and also a B&O suppository per rectum for bladder spasms.  The patient was then taken back to the recovery room in satisfactory condition. Dictated by:   Claudette Laws, M.D. Attending Physician:  Monica Becton DD:  12/05/01 TD:  12/05/01 Job: [id number]

## 2011-02-11 NOTE — Assessment & Plan Note (Signed)
[facility]                           GASTROENTEROLOGY OFFICE NOTE   Jerry Rios, Jerry Rios                    MRN:          [id number]  DATE:07/18/2006                            DOB:          Sep 22, 1951    HISTORY:  Jerry Rios presents today for followup.  He was evaluated June 27, 2006, for several-month history of persistent left lower quadrant  abdominal discomfort.  See that dictation for details.  As well, change in  his stools is manifested by more narrow caliber and slight weight loss.  He  was hemoccult negative.  Laboratories including CBC, comprehensive metabolic  panel, and urinalysis were unremarkable.  CT scan of the abdomen and pelvis  showed diverticulosis, though no obvious diverticulitis.  Does have small  bilateral kidney stones which were nonobstructing.   He was treated empirically with ciprofloxacin and Flagyl for 10 days.  He  completed therapy.  About 1 week into antibiotic therapy, his symptoms had  improved.  Also at that time, he reports that he stopped drinking coffee,  switched to soluble fiber, and discontinued some vitamins.  He is currently  still with some pressure in the left lower quadrant.  He states his bowel  movements seem to have improved.  Overall, he states he is 70% better,  though not 100% better.   His only other complaint is that of insomnia.  This has been going on for  about a year intermittently.  He states he wakes up around 3 a.m. and then  cannot get back to sleep.  He inquires about a medication for insomnia.   CURRENT MEDICATIONS:  He is currently on no regular medications.   PHYSICAL EXAMINATION:  GENERAL:  Well-looking male in no acute distress.  VITAL SIGNS: Blood pressure 112/70, heart rate 60.  Weight 159.4 pounds  (decreased 0.6 pounds).  ABDOMEN: Soft with no appreciable tenderness to palpation in the left lower  quadrant.  Good bowel sounds heard.   IMPRESSION:  27. A  60 year old gentleman with several-month history of abdominal      discomfort and slight change in bowel habits.  Workup is discussed      above.  Seemingly improved after antibiotic therapy.  Question low-      grade diverticulitis.  I cannot rule out occult colonic lesion, though      nothing noted on CT.  Last colonoscopy in early 2001.  2. Insomnia.   RECOMMENDATIONS:  1. Schedule colonoscopy to exclude intraluminal colonic pathology of      significance.  The nature of the procedure as well as the risks,      benefits, and alternatives have been reviewed.  He understood and      agreed to proceed.  2. Ambien 5 mg at night p.r.n. insomnia.  3. Ongoing general medical care with Dr. Jonny Rios.            ______________________________  Wilhemina Bonito. Eda Keys., MD      JNP/MedQ  DD:  07/18/2006  DT:  07/19/2006  Job #:  [id number]   cc:   Jerry Levins, MD

## 2011-02-11 NOTE — Assessment & Plan Note (Signed)
[facility]                           GASTROENTEROLOGY OFFICE NOTE   Jerry, Rios                    MRN:          [id number]  DATE:06/27/2006                            DOB:          1950/11/28    REFERRING PHYSICIAN:  Corwin Levins, MD   OFFICE CONSULT   PROBLEM:  Left-sided abdominal discomfort.   HISTORY:  Jerry Rios is a pleasant, 60 year old white male with history of  hyperlipidemia, ureterolithiasis, and seasonal allergies.  He was seen by  Dr. Marina Goodell, approximately 6 years ago, at which time he had undergone  colonoscopy for complaints of rectal bleeding and rectal pain.  Colonoscopy  was done on 11/01/1999 and showed left-sided diverticular disease, internal  hemorrhoids and he was felt to have  __________ as the etiology of his  rectal pain.   The patient comes in today complaining of abdominal pain over the past  couple of months.  He says that it is a constant discomfort which is crampy  in nature but never goes away.  It seems to be aggravated by eating, perhaps  briefly a bit better after bowel movements, but then returns to its baseline  level.  He has not noted any melena or hematochezia.  He does feel that his  stools have been narrower in caliber even with a lot of fiber.  He has not  had any associated fever or chills. His appetite has been fair, but his  weight is down about 5 pounds because he says that he knows he is going to  hurt after he eats.  He has not had any associated nausea or vomiting.  He  has had some bloating.  The patient has never had diverticulitis that he is  aware of.  He has no current urinary symptoms.  He has had kidney stones and  says that this feels different.   CURRENT MEDICATIONS:  Allegra p.r.n. and Flonase p.r.n.   ALLERGIES:  No known drug allergies.  He is intolerant to several statins:  LIPITOR, CRESTOR, ZOCOR and PRAVACHOL.   PAST HISTORY:  No abdominal surgery.  He has had  ureterolithiasis which  required surgical removal.  Other medical problems as listed above.   FAMILY HISTORY:  Pertinent for heart disease in his grandparents.  There is  no family of colon cancer or polyps.  He does have a sister with ulcerative  colitis.   SOCIAL HISTORY:  The patient is single. He is employed in Conservation [employer], historic buildings.  He is nonsmoker and uses alcohol on occasion.   REVIEW OF SYSTEMS:  Pertinent for difficulty sleeping.  Some decreased  hearing.  An occasional urinary leakage. GI:  As above.   PHYSICAL EXAMINATION:  A well-developed, thin, white male in no acute  distress.  Height 5 feet 7 inches, weight is 160.  Blood pressure 112/64, pulse 56.  CARDIOVASCULAR:  Regular rate and rhythm with S1-S2.  PULMONARY:  Clear to A&P.  ABDOMEN:  Soft.  He is minimally tender in the left lower quadrant.  There  is no guarding or rebound.  Bowel sounds are active.  RECTAL EXAM:  Heme-negative and without mass.   IMPRESSION:  8. A 60 year old white male with a 56-month history of left-sided abdominal      pain.  Etiology is not clear.  Rule out low-grade diverticulitis.  2. Rule out occult lesion or stricture.  3. Rule out irritable bowel syndrome.   PLAN:  1. Check CBC, CMET, and UA today.  2. CT scan of the abdomen and pelvis.  3. Treat with course of Cipro 500 mg b.i.d. x10 days and Flagyl 250 q.i.d.      x10 days and then reassess in the office in 2-3 weeks.  Depending on      results of CT and his response to the antibiotics we will decide if      repeat colonoscopy is indicated.      ______________________________  Mike Gip, PA-C    ______________________________  Wilhemina Bonito. Marina Goodell, MD     AE/MedQ  DD:  06/27/2006  DT:  06/28/2006  Job #:  [id number]

## 2011-04-18 ENCOUNTER — Ambulatory Visit (HOSPITAL_COMMUNITY)
Admission: RE | Admit: 2011-04-18 | Discharge: 2011-04-18 | Disposition: A | Discharge status: Home / Self Care | Payer: 59 | Source: Ambulatory Visit | Attending: Urology | Admitting: Urology

## 2011-04-18 ENCOUNTER — Ambulatory Visit (HOSPITAL_COMMUNITY): Payer: 59

## 2011-04-18 DIAGNOSIS — N200 Calculus of kidney: Secondary | ICD-10-CM | POA: Insufficient documentation

## 2011-07-04 ENCOUNTER — Encounter: Payer: 59 | Admitting: Internal Medicine

## 2011-09-03 ENCOUNTER — Encounter: Payer: Self-pay | Admitting: Internal Medicine

## 2011-09-12 ENCOUNTER — Encounter: Payer: 59 | Admitting: Internal Medicine

## 2011-10-01 ENCOUNTER — Encounter: Payer: Self-pay | Admitting: Internal Medicine

## 2011-10-01 DIAGNOSIS — Z0001 Encounter for general adult medical examination with abnormal findings: Secondary | ICD-10-CM | POA: Insufficient documentation

## 2011-10-04 ENCOUNTER — Other Ambulatory Visit: Payer: Self-pay | Admitting: Internal Medicine

## 2011-10-04 ENCOUNTER — Other Ambulatory Visit (INDEPENDENT_AMBULATORY_CARE_PROVIDER_SITE_OTHER): Payer: 59

## 2011-10-04 ENCOUNTER — Ambulatory Visit (INDEPENDENT_AMBULATORY_CARE_PROVIDER_SITE_OTHER): Payer: 59 | Admitting: Internal Medicine

## 2011-10-04 ENCOUNTER — Encounter: Payer: Self-pay | Admitting: Internal Medicine

## 2011-10-04 VITALS — BP 112/66 | HR 61 | Temp 97.5°F | Ht 67.0 in | Wt 160.5 lb

## 2011-10-04 DIAGNOSIS — E785 Hyperlipidemia, unspecified: Secondary | ICD-10-CM

## 2011-10-04 DIAGNOSIS — R6882 Decreased libido: Secondary | ICD-10-CM

## 2011-10-04 DIAGNOSIS — Z0000 Encounter for general adult medical examination without abnormal findings: Secondary | ICD-10-CM

## 2011-10-04 DIAGNOSIS — Z86010 Personal history of colonic polyps: Secondary | ICD-10-CM

## 2011-10-04 HISTORY — DX: Decreased libido: R68.82

## 2011-10-04 LAB — CBC WITH DIFFERENTIAL/PLATELET
Basophils Absolute: 0 10*3/uL (ref 0.0–0.1)
Eosinophils Absolute: 0.2 10*3/uL (ref 0.0–0.7)
Hemoglobin: 14.9 g/dL (ref 13.0–17.0)
Lymphocytes Relative: 26.5 % (ref 12.0–46.0)
MCHC: 34.7 g/dL (ref 30.0–36.0)
Monocytes Absolute: 0.5 10*3/uL (ref 0.1–1.0)
Neutro Abs: 4.2 10*3/uL (ref 1.4–7.7)
Neutrophils Relative %: 62.7 % (ref 43.0–77.0)
RDW: 13.3 % (ref 11.5–14.6)

## 2011-10-04 LAB — URINALYSIS, ROUTINE W REFLEX MICROSCOPIC
Bilirubin Urine: NEGATIVE
Hgb urine dipstick: NEGATIVE
Urine Glucose: NEGATIVE
Urobilinogen, UA: 0.2 (ref 0.0–1.0)

## 2011-10-04 LAB — LDL CHOLESTEROL, DIRECT: Direct LDL: 176.3 mg/dL

## 2011-10-04 LAB — HEPATIC FUNCTION PANEL
ALT: 15 U/L (ref 0–53)
Alkaline Phosphatase: 35 U/L — ABNORMAL LOW (ref 39–117)
Bilirubin, Direct: 0.1 mg/dL (ref 0.0–0.3)
Total Protein: 6.7 g/dL (ref 6.0–8.3)

## 2011-10-04 LAB — BASIC METABOLIC PANEL
CO2: 30 mEq/L (ref 19–32)
Chloride: 105 mEq/L (ref 96–112)
Sodium: 140 mEq/L (ref 135–145)

## 2011-10-04 LAB — LIPID PANEL
HDL: 84.4 mg/dL (ref >39.00)
Total CHOL/HDL Ratio: 3
VLDL: 13 mg/dL (ref 0.0–40.0)

## 2011-10-04 MED ORDER — METRONIDAZOLE 1 % EX GEL: 1.0000 "application " | Freq: Every day | CUTANEOUS | Status: DC

## 2011-10-04 MED ORDER — CETIRIZINE HCL 10 MG PO TABS: 10.0000 mg | ORAL_TABLET | Freq: Every day | ORAL | Status: DC

## 2011-10-04 MED ORDER — FLUTICASONE PROPIONATE 50 MCG/ACT NA SUSP: 2.0000 | Freq: Every day | NASAL | Status: DC

## 2011-10-04 MED ORDER — PRAVASTATIN SODIUM 80 MG PO TABS: 80.0000 mg | ORAL_TABLET | Freq: Every day | ORAL | Status: DC

## 2011-10-04 MED ORDER — ASPIRIN 325 MG PO TABS: 325.0000 mg | ORAL_TABLET | Freq: Every day | ORAL | Status: DC

## 2011-10-04 NOTE — Progress Notes (Signed)
Subjective:    Patient ID: Jerry Rios, male    DOB: [birth year], 61 y.o.   MRN: [id number]  HPI  Here for wellness and f/u;  Overall doing ok;  Pt denies CP, worsening SOB, DOE, wheezing, orthopnea, PND, worsening LE edema, palpitations, dizziness or syncope.  Pt denies neurological change such as new Headache, facial or extremity weakness.  Pt denies polydipsia, polyuria, or low sugar symptoms. Pt states overall good compliance with treatment and medications, good tolerability, and trying to follow lower cholesterol diet.  Pt denies worsening depressive symptoms, suicidal ideation or panic. No fever, wt loss, night sweats, loss of appetite, or other constitutional symptoms.  Pt states good ability with ADL's, low fall risk, home safety reviewed and adequate, no significant changes in hearing or vision, and occasionally active with exercise.  Has had decreaed libido in the past yr as well.  In retrospect he does not think prior rash was from the pravastatin as the rash persisted recurrent mild, and wants to re-start the pravachol.  Also for some reason tolerates the new Bayer "pro'release" form of ASA 325 and taking daily. Due for colonoscpy as he received letter dec 2012 but has not yet called Past Medical History  Diagnosis Date  . Depression   . Insomnia   . Allergy   . Hyperlipidemia   . History of nephrolithiasis   . BPH (benign prostatic hypertrophy)   . Diverticulosis of colon   . Hx of colonic polyp   . History of shingles   . Renal insufficiency   . Libido, decreased 10/04/2011   Past Surgical History  Procedure Date  . Tonsillectomy   . Lithotrypsy     reports that he has quit smoking. He does not have any smokeless tobacco history on file. He reports that he does not drink alcohol or use illicit drugs. family history includes Heart disease in his other; Hyperlipidemia in his other; Hypertension in his other; and Ulcerative colitis in his sister. Allergies  Allergen  Reactions  . Atorvastatin     REACTION: myalgia  . Rosuvastatin     REACTION: myalgia  . Simvastatin     REACTION: myalgia   Current Outpatient Prescriptions on File Prior to Visit  Medication Sig Dispense Refill  . Emollient (ATOPICLAIR) CREA Apply topically.        . fexofenadine (ALLEGRA) 180 MG tablet Take 180 mg by mouth daily.        . Multiple Vitamin (MULTIVITAMIN) tablet Take 1 tablet by mouth daily.         Review of Systems Review of Systems  Constitutional: Negative for diaphoresis, activity change, appetite change and unexpected weight change.  HENT: Negative for hearing loss, ear pain, facial swelling, mouth sores and neck stiffness.   Eyes: Negative for pain, redness and visual disturbance.  Respiratory: Negative for shortness of breath and wheezing.   Cardiovascular: Negative for chest pain and palpitations.  Gastrointestinal: Negative for diarrhea, blood in stool, abdominal distention and rectal pain.  Genitourinary: Negative for hematuria, flank pain and decreased urine volume.  Musculoskeletal: Negative for myalgias and joint swelling.  Skin: Negative for color change and wound.  Neurological: Negative for syncope and numbness.  Hematological: Negative for adenopathy.  Psychiatric/Behavioral: Negative for hallucinations, self-injury, decreased concentration and agitation.      Objective:   Physical Exam BP 112/66  Pulse 61  Temp(Src) 97.5 F (36.4 C) (Oral)  Ht 5\' 7"  (1.702 m)  Wt 160 lb 8 oz (72.802 kg)  BMI 25.14 kg/m2  SpO2 98% Physical Exam  VS noted Constitutional: Pt is oriented to person, place, and time. Appears well-developed and well-nourished.  HENT:  Head: Normocephalic and atraumatic.  Right Ear: External ear normal.  Left Ear: External ear normal.  Nose: Nose normal.  Mouth/Throat: Oropharynx is clear and moist.  Eyes: Conjunctivae and EOM are normal. Pupils are equal, round, and reactive to light.  Neck: Normal range of motion. Neck  supple. No JVD present. No tracheal deviation present.  Cardiovascular: Normal rate, regular rhythm, normal heart sounds and intact distal pulses.   Pulmonary/Chest: Effort normal and breath sounds normal.  Abdominal: Soft. Bowel sounds are normal. There is no tenderness.  Musculoskeletal: Normal range of motion. Exhibits no edema.  Lymphadenopathy:  Has no cervical adenopathy.  Neurological: Pt is alert and oriented to person, place, and time. Pt has normal reflexes. No cranial nerve deficit.  Skin: Skin is warm and dry. No rash noted.  Psychiatric:  Has  normal mood and affect. Behavior is normal.     Assessment & Plan:

## 2011-10-04 NOTE — Patient Instructions (Addendum)
You are due for followup colonscopy;  You will be contacted regarding the referral for: colonoscopy Please go to LAB in the Basement for the blood and/or urine tests to be done today Please call the phone number [phone] (the PhoneTree System) for results of testing in 2-3 days;  When calling, simply dial the number, and when prompted enter the MRN number above (the Medical Record Number) and the # key, then the message should start. Take all new medications as prescribed - the pravastatin 80 mg per day Continue all other medications as before Please return in 1 year for your yearly visit, or sooner if needed, with Lab testing done 3-5 days before

## 2011-10-04 NOTE — Assessment & Plan Note (Signed)

## 2011-10-05 LAB — TESTOSTERONE, FREE, TOTAL, SHBG: Testosterone-% Free: 1.7 % (ref 1.6–2.9)

## 2011-10-09 ENCOUNTER — Encounter: Payer: Self-pay | Admitting: Internal Medicine

## 2011-10-09 NOTE — Assessment & Plan Note (Signed)
For testosterone check,  to f/u any worsening symptoms or concerns  

## 2011-10-09 NOTE — Assessment & Plan Note (Signed)
For pravachol 80 qd,  to f/u any worsening symptoms or concerns

## 2011-10-09 NOTE — Assessment & Plan Note (Signed)
For colonoscpoy as he is due

## 2011-11-07 ENCOUNTER — Ambulatory Visit (AMBULATORY_SURGERY_CENTER): Payer: 59 | Admitting: *Deleted

## 2011-11-07 ENCOUNTER — Encounter: Payer: Self-pay | Admitting: Internal Medicine

## 2011-11-07 VITALS — Ht 67.0 in | Wt 159.0 lb

## 2011-11-07 DIAGNOSIS — Z1211 Encounter for screening for malignant neoplasm of colon: Secondary | ICD-10-CM

## 2011-11-07 MED ORDER — PEG-KCL-NACL-NASULF-NA ASC-C 100 G PO SOLR: ORAL | Status: DC

## 2011-11-21 ENCOUNTER — Encounter: Payer: Self-pay | Admitting: Internal Medicine

## 2011-11-21 ENCOUNTER — Ambulatory Visit (AMBULATORY_SURGERY_CENTER): Payer: 59 | Admitting: Internal Medicine

## 2011-11-21 VITALS — BP 128/75 | HR 64 | Temp 97.7°F | Resp 17 | Ht 67.0 in | Wt 159.0 lb

## 2011-11-21 DIAGNOSIS — Z1211 Encounter for screening for malignant neoplasm of colon: Secondary | ICD-10-CM

## 2011-11-21 DIAGNOSIS — K5730 Diverticulosis of large intestine without perforation or abscess without bleeding: Secondary | ICD-10-CM

## 2011-11-21 DIAGNOSIS — Z860100 Personal history of colon polyps, unspecified: Secondary | ICD-10-CM

## 2011-11-21 MED ORDER — SODIUM CHLORIDE 0.9 % IV SOLN: 500.0000 mL | INTRAVENOUS | Status: DC

## 2011-11-21 NOTE — Op Note (Signed)
[facility] [address] [place], [place]  [address]  COLONOSCOPY PROCEDURE REPORT  PATIENT:  Jerry Rios, Jerry Rios  MR#:  [id number] BIRTHDATE:  November 11, 1950, 60 yrs. old  GENDER:  male ENDOSCOPIST:  Wilhemina Bonito. Eda Keys, MD REF. 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  Digital rectal exam was performed and revealed no abnormalities.   The LB CF-[id number] endoscope was introduced through the anus and advanced to the cecum, which was identified by both the appendix and ileocecal valve, without limitations.  The quality of the prep was excellent, using MoviPrep.  The instrument was then slowly withdrawn as the colon was fully examined. <<PROCEDUREIMAGES>>  FINDINGS:  Moderate diverticulosis was found ascending colon and sigmoid colon.  Otherwise normal colonoscopy without  polyps, masses, vascular ectasias, or inflammatory changes.   Retroflexed views in the rectum revealed internal hemorrhoids.    The time to cecum = 2:25  minutes. The scope was then withdrawn in 13:02 minutes from the cecum and the procedure completed.  COMPLICATIONS:  None  ENDOSCOPIC IMPRESSION: 1) Moderate diverticulosis ascending colon and sigmoid colon 2) Otherwise normal colonoscopy  RECOMMENDATIONS: 1) Follow up colonoscopy in 5 years (hx of polyps)  ______________________________ Wilhemina Bonito. Eda Keys, MD  CC:  Corwin Levins, MD;  The Patient  n. eSIGNED:   Wilhemina Bonito. Eda Keys at 11/21/2011 10:57 AM  Delories Heinz, [phone]

## 2011-11-21 NOTE — Progress Notes (Signed)
ON REMOVAL OF EKG LEADS, WIPED AREAS WITH WET CLOTH & ALCOHOL PADS TO REMOVE ANY ADHESIVE LEFT BEHIND @ PT'S REQUEST. SOME REDNESS NOTED @ SITES OF LEADS.

## 2011-11-21 NOTE — Patient Instructions (Signed)
YOU HAD AN ENDOSCOPIC PROCEDURE TODAY AT THE [facility]: Refer to the procedure report that was given to you for any specific questions about what was found during the examination.  If the procedure report does not answer your questions, please call your gastroenterologist to clarify.  If you requested that your care partner not be given the details of your procedure findings, then the procedure report has been included in a sealed envelope for you to review at your convenience later.  YOU SHOULD EXPECT: Some feelings of bloating in the abdomen. Passage of more gas than usual.  Walking can help get rid of the air that was put into your GI tract during the procedure and reduce the bloating. If you had a lower endoscopy (such as a colonoscopy or flexible sigmoidoscopy) you may notice spotting of blood in your stool or on the toilet paper. If you underwent a bowel prep for your procedure, then you may not have a normal bowel movement for a few days.  DIET: Your first meal following the procedure should be a light meal and then it is ok to progress to your normal diet.  A half-sandwich or bowl of soup is an example of a good first meal.  Heavy or fried foods are harder to digest and may make you feel nauseous or bloated.  Likewise meals heavy in dairy and vegetables can cause extra gas to form and this can also increase the bloating.  Drink plenty of fluids but you should avoid alcoholic beverages for 24 hours.  ACTIVITY: Your care partner should take you home directly after the procedure.  You should plan to take it easy, moving slowly for the rest of the day.  You can resume normal activity the day after the procedure however you should NOT DRIVE or use heavy machinery for 24 hours (because of the sedation medicines used during the test).    SYMPTOMS TO REPORT IMMEDIATELY: A gastroenterologist can be reached at any hour.  During normal business hours, 8:30 AM to 5:00 PM Monday through Friday,  call [phone].  After hours and on weekends, please call the GI answering service at [phone] who will take a message and have the physician on call contact you.   Following lower endoscopy (colonoscopy or flexible sigmoidoscopy):  Excessive amounts of blood in the stool  Significant tenderness or worsening of abdominal pains  Swelling of the abdomen that is new, acute  Fever of 100F or higher  Following upper endoscopy (EGD)  Vomiting of blood or coffee ground material  New chest pain or pain under the shoulder blades  Painful or persistently difficult swallowing  New shortness of breath  Fever of 100F or higher  Black, tarry-looking stools  FOLLOW UP: If any biopsies were taken you will be contacted by phone or by letter within the next 1-3 weeks.  Call your gastroenterologist if you have not heard about the biopsies in 3 weeks.  Our staff will call the home number listed on your records the next business day following your procedure to check on you and address any questions or concerns that you may have at that time regarding the information given to you following your procedure. This is a courtesy call and so if there is no answer at the home number and we have not heard from you through the emergency physician on call, we will assume that you have returned to your regular daily activities without incident.  SIGNATURES/CONFIDENTIALITY: You and/or your care   partner have signed paperwork which will be entered into your electronic medical record.  These signatures attest to the fact that that the information above on your After Visit Summary has been reviewed and is understood.  Full responsibility of the confidentiality of this discharge information lies with you and/or your care-partner.   INFORMATION ON DIVERTICULOSIS & HIGH FIBER DIET GIVEN TO YOU 

## 2011-11-22 ENCOUNTER — Telehealth: Payer: Self-pay | Admitting: *Deleted

## 2011-11-22 NOTE — Telephone Encounter (Signed)
  Follow up Call-  Call back number 11/21/2011  Post procedure Call Back phone  # [phone]  Permission to leave phone message Yes     Message left to call us back if experiencing problems or has questions

## 2012-10-21 ENCOUNTER — Other Ambulatory Visit: Payer: Self-pay | Admitting: Internal Medicine

## 2012-10-31 ENCOUNTER — Ambulatory Visit (INDEPENDENT_AMBULATORY_CARE_PROVIDER_SITE_OTHER): Payer: 59 | Admitting: Internal Medicine

## 2012-10-31 ENCOUNTER — Encounter: Payer: Self-pay | Admitting: Internal Medicine

## 2012-10-31 VITALS — BP 100/64 | HR 67 | Temp 98.0°F | Ht 67.0 in | Wt 157.1 lb

## 2012-10-31 DIAGNOSIS — E785 Hyperlipidemia, unspecified: Secondary | ICD-10-CM

## 2012-10-31 DIAGNOSIS — L80 Vitiligo: Secondary | ICD-10-CM

## 2012-10-31 DIAGNOSIS — Z0000 Encounter for general adult medical examination without abnormal findings: Secondary | ICD-10-CM

## 2012-10-31 HISTORY — DX: Vitiligo: L80

## 2012-10-31 MED ORDER — TACROLIMUS 0.03 % EX OINT: TOPICAL_OINTMENT | Freq: Two times a day (BID) | CUTANEOUS | Status: DC

## 2012-10-31 MED ORDER — PRAVASTATIN SODIUM 40 MG PO TABS: 40.0000 mg | ORAL_TABLET | ORAL | Status: DC

## 2012-10-31 NOTE — Assessment & Plan Note (Signed)
Ok for limited refill protopix, but further should be per dermatology

## 2012-10-31 NOTE — Patient Instructions (Addendum)
Please take all new medication as prescribed - the vitiligo cream, and the pravastatin 40 mg at every other day Please continue all other medications as before Please continue your efforts at being more active, low cholesterol diet, and weight control. Please go to the LAB in the Basement (turn left off the elevator) for the tests to be done at [facility] at your convenience You will be contacted by phone if any changes need to be made immediately based on the lab work. Thank you for enrolling in MyChart. Please follow the instructions below to securely access your online medical record. MyChart allows you to send messages to your doctor, view your test results, renew your prescriptions, schedule appointments, and more To Log into My Chart online, please go by [facility] or [employer] to [email or web address], or download the MyChart App from the [employer] .  Your Username is: [email or web address] ([email or web address]) Please send a practice Message on Mychart later today. Please return in 1 year for your yearly visit, or sooner if needed, with Lab testing done 3-5 days before

## 2012-10-31 NOTE — Assessment & Plan Note (Signed)
Intol to most statins so far, I have asked if he would try lower dose qod of the pravachol and he agrees

## 2012-10-31 NOTE — Assessment & Plan Note (Signed)

## 2012-10-31 NOTE — Progress Notes (Signed)
Subjective:    Patient ID: ANTWOIN LACKEY, male    DOB: [birth year], 62 y.o.   MRN: [id number]  HPI  Here for wellness and f/u;  Overall doing ok;  Pt denies CP, worsening SOB, DOE, wheezing, orthopnea, PND, worsening LE edema, palpitations, dizziness or syncope.  Pt denies neurological change such as new headache, facial or extremity weakness.  Pt denies polydipsia, polyuria, or low sugar symptoms. Pt states overall good compliance with treatment and medications, good tolerability, and has been trying to follow lower cholesterol diet.  Pt denies worsening depressive symptoms, suicidal ideation or panic. No fever, night sweats, wt loss, loss of appetite, or other constitutional symptoms.  Pt states good ability with ADL's, has low fall risk, home safety reviewed and adequate, no other significant changes in hearing or vision, and only occasionally active with exercise.  Has has intolerance to most statins, and seemed to have myalgias as well to the pravastatin 80 mg rx last yr, so has not been taking for several months.  Asks for refill of the protopix for his vitiligo, dx 1 yr ago with derm, has not been seen recently Past Medical History  Diagnosis Date  . Insomnia   . Allergy   . Hyperlipidemia   . History of nephrolithiasis   . BPH (benign prostatic hypertrophy)   . Diverticulosis of colon   . Hx of colonic polyp   . History of shingles   . Libido, decreased 10/04/2011  . Vitiligo 10/31/2012   Past Surgical History  Procedure Date  . Tonsillectomy   . Lithotrypsy     reports that he has quit smoking. He has never used smokeless tobacco. He reports that he drinks about 2.4 ounces of alcohol per week. He reports that he does not use illicit drugs. family history includes Colitis in his sister; Heart disease in his other; Hyperlipidemia in his other; Hypertension in his other; and Ulcerative colitis in his sister. Allergies  Allergen Reactions  . Atorvastatin     REACTION: myalgia  .  Rosuvastatin     REACTION: myalgia  . Simvastatin     REACTION: myalgia   Current Outpatient Prescriptions on File Prior to Visit  Medication Sig Dispense Refill  . aspirin (BAYER ADVANCED ASPIRIN EX ST) 500 MG tablet Take 500 mg by mouth 3 (three) times a week.      . fexofenadine (ALLEGRA) 180 MG tablet Take 180 mg by mouth daily.        . fluticasone (FLONASE) 50 MCG/ACT nasal spray PLACE 2 SPRAYS INTO THE NOSE DAILY.  16 g  5  . Multiple Vitamin (MULTIVITAMIN) tablet Take 1 tablet by mouth daily.        . cetirizine (ZYRTEC) 10 MG tablet Take 1 tablet (10 mg total) by mouth daily.  30 tablet  11  . Emollient (ATOPICLAIR) CREA Apply topically.        . metroNIDAZOLE (METROGEL) 1 % gel Apply 1 application topically daily.  45 g  3  . pravastatin (PRAVACHOL) 40 MG tablet Take 1 tablet (40 mg total) by mouth every other day.  45 tablet  3   Review of Systems Constitutional: Negative for diaphoresis, activity change, appetite change or unexpected weight change.  HENT: Negative for hearing loss, ear pain, facial swelling, mouth sores and neck stiffness.   Eyes: Negative for pain, redness and visual disturbance.  Respiratory: Negative for shortness of breath and wheezing.   Cardiovascular: Negative for chest pain and palpitations.  Gastrointestinal: Negative for  diarrhea, blood in stool, abdominal distention or other pain Genitourinary: Negative for hematuria, flank pain or change in urine volume.  Musculoskeletal: Negative for myalgias and joint swelling.  Skin: Negative for color change and wound.  Neurological: Negative for syncope and numbness. other than noted Hematological: Negative for adenopathy.  Psychiatric/Behavioral: Negative for hallucinations, self-injury, decreased concentration and agitation.      Objective:   Physical Exam BP 100/64  Pulse 67  Temp 98 F (36.7 C) (Oral)  Ht 5\' 7"  (1.702 m)  Wt 157 lb 2 oz (71.271 kg)  BMI 24.61 kg/m2  SpO2 97% VS noted,   Constitutional: Pt is oriented to person, place, and time. Appears well-developed and well-nourished.  Head: Normocephalic and atraumatic.  Right Ear: External ear normal.  Left Ear: External ear normal.  Nose: Nose normal.  Mouth/Throat: Oropharynx is clear and moist.  Eyes: Conjunctivae and EOM are normal. Pupils are equal, round, and reactive to light.  Neck: Normal range of motion. Neck supple. No JVD present. No tracheal deviation present.  Cardiovascular: Normal rate, regular rhythm, normal heart sounds and intact distal pulses.   Pulmonary/Chest: Effort normal and breath sounds normal.  Abdominal: Soft. Bowel sounds are normal. There is no tenderness. No HSM  Musculoskeletal: Normal range of motion. Exhibits no edema.  Lymphadenopathy:  Has no cervical adenopathy.  Neurological: Pt is alert and oriented to person, place, and time. Pt has normal reflexes. No cranial nerve deficit. Motor/dtr intact Skin: Skin is warm and dry. No rash noted. except for typical vitiligo left mid lateral neck Psychiatric:  Has  normal mood and affect. Behavior is normal.     Assessment & Plan:

## 2012-12-06 ENCOUNTER — Telehealth: Payer: Self-pay | Admitting: *Deleted

## 2012-12-06 NOTE — Telephone Encounter (Signed)
Left smg on triage requesting last PSA fax over @ [phone]. Faxed last report...lmb

## 2013-04-23 ENCOUNTER — Encounter: Payer: Self-pay | Admitting: Internal Medicine

## 2013-07-30 ENCOUNTER — Other Ambulatory Visit: Payer: Self-pay | Admitting: Internal Medicine

## 2013-08-01 ENCOUNTER — Other Ambulatory Visit: Payer: Self-pay

## 2013-11-01 ENCOUNTER — Encounter: Payer: Self-pay | Admitting: Internal Medicine

## 2013-11-01 ENCOUNTER — Ambulatory Visit (INDEPENDENT_AMBULATORY_CARE_PROVIDER_SITE_OTHER): Payer: 59 | Admitting: Internal Medicine

## 2013-11-01 ENCOUNTER — Ambulatory Visit (INDEPENDENT_AMBULATORY_CARE_PROVIDER_SITE_OTHER): Payer: 59

## 2013-11-01 VITALS — BP 108/72 | HR 57 | Temp 97.6°F | Ht 67.0 in | Wt 153.2 lb

## 2013-11-01 DIAGNOSIS — E785 Hyperlipidemia, unspecified: Secondary | ICD-10-CM

## 2013-11-01 DIAGNOSIS — Z0000 Encounter for general adult medical examination without abnormal findings: Secondary | ICD-10-CM

## 2013-11-01 LAB — URINALYSIS, ROUTINE W REFLEX MICROSCOPIC
BILIRUBIN URINE: NEGATIVE
HGB URINE DIPSTICK: NEGATIVE
KETONES UR: NEGATIVE
LEUKOCYTES UA: NEGATIVE
Nitrite: NEGATIVE
Specific Gravity, Urine: 1.015 (ref 1.000–1.030)
Total Protein, Urine: NEGATIVE
URINE GLUCOSE: NEGATIVE
UROBILINOGEN UA: 0.2 (ref 0.0–1.0)
pH: 7 (ref 5.0–8.0)

## 2013-11-01 LAB — CBC WITH DIFFERENTIAL/PLATELET
BASOS ABS: 0 10*3/uL (ref 0.0–0.1)
Basophils Relative: 0.6 % (ref 0.0–3.0)
EOS ABS: 0.1 10*3/uL (ref 0.0–0.7)
Eosinophils Relative: 1.5 % (ref 0.0–5.0)
HEMATOCRIT: 44.4 % (ref 39.0–52.0)
Hemoglobin: 15.2 g/dL (ref 13.0–17.0)
LYMPHS ABS: 1.8 10*3/uL (ref 0.7–4.0)
Lymphocytes Relative: 31.3 % (ref 12.0–46.0)
MCHC: 34.2 g/dL (ref 30.0–36.0)
MCV: 91.9 fl (ref 78.0–100.0)
MONO ABS: 0.6 10*3/uL (ref 0.1–1.0)
MONOS PCT: 9.5 % (ref 3.0–12.0)
Neutro Abs: 3.3 10*3/uL (ref 1.4–7.7)
Neutrophils Relative %: 57.1 % (ref 43.0–77.0)
PLATELETS: 238 10*3/uL (ref 150.0–400.0)
RBC: 4.83 Mil/uL (ref 4.22–5.81)
RDW: 13.4 % (ref 11.5–14.6)
WBC: 5.9 10*3/uL (ref 4.5–10.5)

## 2013-11-01 LAB — TSH: TSH: 1.34 u[IU]/mL (ref 0.35–5.50)

## 2013-11-01 LAB — PSA: PSA: 1.47 ng/mL (ref 0.10–4.00)

## 2013-11-01 MED ORDER — METRONIDAZOLE 1 % EX GEL: 1.0000 "application " | Freq: Every day | CUTANEOUS | Status: DC

## 2013-11-01 MED ORDER — FLUTICASONE PROPIONATE 50 MCG/ACT NA SUSP: 1.0000 | Freq: Every day | NASAL | Status: DC

## 2013-11-01 NOTE — Assessment & Plan Note (Signed)
Statin intol, to cont lower chol diet

## 2013-11-01 NOTE — Progress Notes (Signed)
Subjective:    Patient ID: Jerry Rios, male    DOB: 1951-04-19, 63 y.o.   MRN: [id number]  HPI Here for wellness and f/u;  Overall doing ok;  Pt denies CP, worsening SOB, DOE, wheezing, orthopnea, PND, worsening LE edema, palpitations, dizziness or syncope.  Pt denies neurological change such as new headache, facial or extremity weakness.  Pt denies polydipsia, polyuria, or low sugar symptoms. Pt states overall good compliance with treatment and medications, good tolerability, and has been trying to follow lower cholesterol diet.  Pt denies worsening depressive symptoms, suicidal ideation or panic. No fever, night sweats, wt loss, loss of appetite, or other constitutional symptoms.  Pt states good ability with ADL's, has low fall risk, home safety reviewed and adequate, no other significant changes in hearing or vision, and only occasionally active with exercise. No acute complaints.  Note has been statin intol in the past, trying to follow lower chol diet.  Had labs at [employer] (his employer) lastyr, results not on chart today Past Medical History  Diagnosis Date  . Insomnia   . Allergy   . Hyperlipidemia   . History of nephrolithiasis   . BPH (benign prostatic hypertrophy)   . Diverticulosis of colon   . Hx of colonic polyp   . History of shingles   . Libido, decreased 10/04/2011  . Vitiligo 10/31/2012   Past Surgical History  Procedure Laterality Date  . Tonsillectomy    . Lithotrypsy      reports that he has quit smoking. He has never used smokeless tobacco. He reports that he drinks about 2.4 ounces of alcohol per week. He reports that he does not use illicit drugs. family history includes Colitis in his sister; Heart disease in his other; Hyperlipidemia in his other; Hypertension in his other; Ulcerative colitis in his sister. Allergies  Allergen Reactions  . Atorvastatin     REACTION: myalgia  . Rosuvastatin     REACTION: myalgia  . Simvastatin     REACTION: myalgia    Current Outpatient Prescriptions on File Prior to Visit  Medication Sig Dispense Refill  . aspirin (BAYER ADVANCED ASPIRIN EX ST) 500 MG tablet Take 500 mg by mouth 3 (three) times a week.      . bisacodyl (DULCOLAX) 5 MG EC tablet Take 5 mg by mouth daily as needed.      . cholecalciferol (VITAMIN D) 400 UNITS TABS Take by mouth.      . Emollient (ATOPICLAIR) CREA Apply topically.        . fexofenadine (ALLEGRA) 180 MG tablet Take 180 mg by mouth daily.        . Melatonin 5 MG TABS Take by mouth.      . Multiple Vitamin (MULTIVITAMIN) tablet Take 1 tablet by mouth daily.        . tacrolimus (PROTOPIC) 0.03 % ointment Apply topically 2 (two) times daily.  100 g  0  . cetirizine (ZYRTEC) 10 MG tablet Take 1 tablet (10 mg total) by mouth daily.  30 tablet  11   No current facility-administered medications on file prior to visit.    Review of Systems Constitutional: Negative for diaphoresis, activity change, appetite change or unexpected weight change.  HENT: Negative for hearing loss, ear pain, facial swelling, mouth sores and neck stiffness.   Eyes: Negative for pain, redness and visual disturbance.  Respiratory: Negative for shortness of breath and wheezing.   Cardiovascular: Negative for chest pain and palpitations.  Gastrointestinal: Negative for  diarrhea, blood in stool, abdominal distention or other pain Genitourinary: Negative for hematuria, flank pain or change in urine volume.  Musculoskeletal: Negative for myalgias and joint swelling.  Skin: Negative for color change and wound.  Neurological: Negative for syncope and numbness. other than noted Hematological: Negative for adenopathy.  Psychiatric/Behavioral: Negative for hallucinations, self-injury, decreased concentration and agitation.      Objective:   Physical Exam BP 108/72  Pulse 57  Temp(Src) 97.6 F (36.4 C) (Oral)  Ht 5\' 7"  (1.702 m)  Wt 153 lb 4 oz (69.514 kg)  BMI 24.00 kg/m2  SpO2 97% VS noted,   Constitutional: Pt is oriented to person, place, and time. Appears well-developed and well-nourished.  Head: Normocephalic and atraumatic.  Right Ear: External ear normal.  Left Ear: External ear normal.  Nose: Nose normal.  Mouth/Throat: Oropharynx is clear and moist.  Eyes: Conjunctivae and EOM are normal. Pupils are equal, round, and reactive to light.  Neck: Normal range of motion. Neck supple. No JVD present. No tracheal deviation present.  Cardiovascular: Normal rate, regular rhythm, normal heart sounds and intact distal pulses.   Pulmonary/Chest: Effort normal and breath sounds normal.  Abdominal: Soft. Bowel sounds are normal. There is no tenderness. No HSM  Musculoskeletal: Normal range of motion. Exhibits no edema.  Lymphadenopathy:  Has no cervical adenopathy.  Neurological: Pt is alert and oriented to person, place, and time. Pt has normal reflexes. No cranial nerve deficit.  Skin: Skin is warm and dry. No rash noted.  Psychiatric:  Has  normal mood and affect. Behavior is normal.      Assessment & Plan:

## 2013-11-01 NOTE — Patient Instructions (Signed)
Please continue all other medications as before, and refills have been done if requested. Please have the pharmacy call with any other refills you may need. Please continue your efforts at being more active, low cholesterol diet, and weight control. You are otherwise up to date with prevention measures today. Please go to the LAB in the Basement (turn left off the elevator) for the tests to be done today You will be contacted by phone if any changes need to be made immediately.  Otherwise, you will receive a letter about your results with an explanation, but please check with MyChart first.  Please remember to sign up for MyChart if you have not done so, as this will be important to you in the future with finding out test results, communicating by private email, and scheduling acute appointments online when needed.  Please return in 1 year for your yearly visit, or sooner if needed, with Lab testing done 3-5 days before

## 2013-11-01 NOTE — Assessment & Plan Note (Addendum)

## 2013-11-01 NOTE — Progress Notes (Signed)
Pre-visit discussion using our clinic review tool. No additional management support is needed unless otherwise documented below in the visit note.  

## 2013-11-04 LAB — BASIC METABOLIC PANEL
BUN: 17 mg/dL (ref 6–23)
CALCIUM: 9.6 mg/dL (ref 8.4–10.5)
CO2: 31 mEq/L (ref 19–32)
Chloride: 103 mEq/L (ref 96–112)
Creatinine, Ser: 1.3 mg/dL (ref 0.4–1.5)
GFR: 60.84 mL/min (ref >60.00)
GLUCOSE: 95 mg/dL (ref 70–99)
POTASSIUM: 4.3 meq/L (ref 3.5–5.1)
SODIUM: 140 meq/L (ref 135–145)

## 2013-11-04 LAB — LIPID PANEL
CHOL/HDL RATIO: 3
Cholesterol: 309 mg/dL — ABNORMAL HIGH (ref 0–200)
HDL: 88.7 mg/dL (ref >39.00)
Triglycerides: 68 mg/dL (ref 0.0–149.0)
VLDL: 13.6 mg/dL (ref 0.0–40.0)

## 2013-11-04 LAB — LDL CHOLESTEROL, DIRECT: LDL DIRECT: 206.1 mg/dL

## 2013-11-04 LAB — HEPATIC FUNCTION PANEL
ALBUMIN: 4.3 g/dL (ref 3.5–5.2)
ALK PHOS: 31 U/L — AB (ref 39–117)
ALT: 16 U/L (ref 0–53)
AST: 20 U/L (ref 0–37)
Bilirubin, Direct: 0.1 mg/dL (ref 0.0–0.3)
Total Bilirubin: 0.8 mg/dL (ref 0.3–1.2)
Total Protein: 6.8 g/dL (ref 6.0–8.3)

## 2014-04-26 LAB — HEMOGLOBIN A1C: Hgb A1c MFr Bld: 5.9 % (ref 4.0–6.0)

## 2014-04-26 LAB — BASIC METABOLIC PANEL: Glucose: 124 mg/dL

## 2014-05-24 ENCOUNTER — Encounter: Payer: Self-pay | Admitting: Internal Medicine

## 2014-05-25 ENCOUNTER — Encounter: Payer: Self-pay | Admitting: Internal Medicine

## 2014-05-26 ENCOUNTER — Encounter: Payer: Self-pay | Admitting: Internal Medicine

## 2014-05-30 ENCOUNTER — Ambulatory Visit (INDEPENDENT_AMBULATORY_CARE_PROVIDER_SITE_OTHER)
Admission: RE | Admit: 2014-05-30 | Discharge: 2014-05-30 | Disposition: A | Discharge status: Home / Self Care | Payer: 59 | Source: Ambulatory Visit | Attending: Internal Medicine | Admitting: Internal Medicine

## 2014-05-30 ENCOUNTER — Other Ambulatory Visit: Payer: Self-pay | Admitting: Internal Medicine

## 2014-05-30 ENCOUNTER — Ambulatory Visit (INDEPENDENT_AMBULATORY_CARE_PROVIDER_SITE_OTHER): Payer: 59 | Admitting: Internal Medicine

## 2014-05-30 ENCOUNTER — Encounter: Payer: Self-pay | Admitting: Internal Medicine

## 2014-05-30 VITALS — BP 112/72 | HR 55 | Temp 98.0°F | Wt 149.1 lb

## 2014-05-30 DIAGNOSIS — R634 Abnormal weight loss: Secondary | ICD-10-CM

## 2014-05-30 DIAGNOSIS — R110 Nausea: Secondary | ICD-10-CM

## 2014-05-30 DIAGNOSIS — R109 Unspecified abdominal pain: Secondary | ICD-10-CM

## 2014-05-30 DIAGNOSIS — E785 Hyperlipidemia, unspecified: Secondary | ICD-10-CM

## 2014-05-30 DIAGNOSIS — N201 Calculus of ureter: Secondary | ICD-10-CM

## 2014-05-30 HISTORY — DX: Unspecified abdominal pain: R10.9

## 2014-05-30 HISTORY — DX: Abnormal weight loss: R63.4

## 2014-05-30 HISTORY — DX: Nausea: R11.0

## 2014-05-30 MED ORDER — ONDANSETRON HCL 4 MG PO TABS: 4.0000 mg | ORAL_TABLET | Freq: Three times a day (TID) | ORAL | Status: DC | PRN

## 2014-05-30 NOTE — Assessment & Plan Note (Addendum)
2 mo persistent pain, hx of renal stone, hx of diverticulitis with tenderness on exam, afeb, does not appear toxic, but also assoc with wt loss last few wks - for ct abd no CM to start, consider with CM if not helpful  Note:  Total time for pt hx, exam, review of record with pt in the room, determination of diagnoses and plan for further eval and tx is > 40 min, with over 50% spent in coordination and counseling of patient

## 2014-05-30 NOTE — Assessment & Plan Note (Signed)
For zofran prn, suspect this as cause of wt loss, consdier GI referral

## 2014-05-30 NOTE — Patient Instructions (Signed)
Please take all new medication as prescribed - the nausea medication  We can hold on blood work today  No need to worry about blood sugar or cholesterol today  Please go to the XRAY Department in the Basement (go straight as you get off the elevator) for the x-ray testing  You will be contacted regarding the referral for:  CT scan - to see [facility] now

## 2014-05-30 NOTE — Assessment & Plan Note (Addendum)
Mild only but signficant, also for cxr today, had labs earlier this yr, will hold on further labs for now

## 2014-05-30 NOTE — Progress Notes (Signed)
Pre visit review using our clinic review tool, if applicable. No additional management support is needed unless otherwise documented below in the visit note. 

## 2014-05-30 NOTE — Progress Notes (Signed)
   Subjective:    Patient ID: Jerry Rios, male    DOB: 09-15-1951, 63 y.o.   MRN: [id number]  HPI  Here with 2 mo ongoing left sided ant abd pain; was trying to just tolerate it, but had some wellness labs at work, with ?preDM with glc 124, but normal a1c, but also with tot chol 309, LDL 206, cr NOT 3.2 but 1.32 (looking at his phone "mychart" type of result in the office).   So abd pain is dull, constant, persistent, mild to mod at worst, nonradiating, ? Worse with twist and turn sometimes, some nausea, no vomiting, no dysphagia, other pain, Denies worsening reflux,  bowel change or blood.  Has occas constipatoin but no better with BM. Has some freq urination, but no change  And Denies urinary symptoms such as dysuria, urgency, flank pain, hematuria or n/v, fever, chills.  Has hx of stones.  Has lost wt but not sure why.   Wt Readings from Last 3 Encounters:  05/30/14 149 lb 2 oz (67.643 kg)  11/01/13 153 lb 4 oz (69.514 kg)  10/31/12 157 lb 2 oz (71.271 kg)  Has occas cough - ? Allergy related.  Had a fever with enlarged tender LN in left axilla and groin that resolved.  Non smoker.   Review of Systems  Constitutional: Negative for unusual diaphoresis or other sweats  HENT: Negative for ringing in ear Eyes: Negative for double vision or worsening visual disturbance.  Respiratory: Negative for choking and stridor.   Gastrointestinal: Negative for vomiting or other signifcant bowel change Genitourinary: Negative for hematuria or decreased urine volume.  Musculoskeletal: Negative for other MSK pain or swelling Skin: Negative for color change and worsening wound.  Neurological: Negative for tremors and numbness other than noted  Psychiatric/Behavioral: Negative for decreased concentration or agitation other than above       Objective:   Physical Exam BP 112/72  Pulse 55  Temp(Src) 98 F (36.7 C) (Oral)  Wt 149 lb 2 oz (67.643 kg)  SpO2 96% VS noted,  Constitutional: Pt appears  well-developed, well-nourished.  HENT: Head: NCAT.  Right Ear: External ear normal.  Left Ear: External ear normal.  Eyes: . Pupils are equal, round, and reactive to light. Conjunctivae and EOM are normal Neck: Normal range of motion. Neck supple.  Cardiovascular: Normal rate and regular rhythm.   Pulmonary/Chest: Effort normal and breath sounds normal.  Abd:  Soft, ND, + BS mild tender mid left and lower quad, without guarding or rebound Neurological: Pt is alert. Not confused , motor grossly intact Skin: Skin is warm. No rash Psychiatric: Pt behavior is normal. No agitation.     Assessment & Plan:

## 2014-05-30 NOTE — Assessment & Plan Note (Signed)
With severe LDL elev, statin intol, consider pcxk9 inhibitor

## 2014-06-04 ENCOUNTER — Encounter: Payer: Self-pay | Admitting: Internal Medicine

## 2014-06-04 DIAGNOSIS — R109 Unspecified abdominal pain: Secondary | ICD-10-CM

## 2014-06-11 ENCOUNTER — Encounter: Payer: Self-pay | Admitting: Internal Medicine

## 2014-06-19 NOTE — Progress Notes (Deleted)
Please put orders in Epic surgery 10-09- pre op 06-27-14 Thanks

## 2014-06-20 ENCOUNTER — Encounter (HOSPITAL_COMMUNITY): Payer: Self-pay | Admitting: Pharmacy Technician

## 2014-06-23 ENCOUNTER — Other Ambulatory Visit: Payer: Self-pay | Admitting: Urology

## 2014-06-26 NOTE — Patient Instructions (Addendum)
Jerry Rios  06/26/2014   Your procedure is scheduled on:  07/04/2014    Report to [facility].  Follow the Signs to [place] at 1000        am  Call this number if you have problems the morning of surgery: [phone]   Remember:   Do not eat food or drink liquids after midnight.   Take these medicines the morning of surgery with A SIP OF WATER: zyrtec if needed and flonase nasal spray if needed    Do not wear jewelry,   Do not wear lotions, powders, or perfumes.  deodorant.  . Men may shave face and neck.  Do not bring valuables to the hospital.  Contacts, dentures or bridgework may not be worn into surgery.       Patients discharged the day of surgery will not be allowed to drive  home.  Name and phone number of your driver:       Please read over the following fact sheets that you were given: [facility] - Preparing for Surgery Before surgery, you can play an important role.  Because skin is not sterile, your skin needs to be as free of germs as possible.  You can reduce the number of germs on your skin by washing with CHG (chlorahexidine gluconate) soap before surgery.  CHG is an antiseptic cleaner which kills germs and bonds with the skin to continue killing germs even after washing. Please DO NOT use if you have an allergy to CHG or antibacterial soaps.  If your skin becomes reddened/irritated stop using the CHG and inform your nurse when you arrive at Short Stay. Do not shave (including legs and underarms) for at least 48 hours prior to the first CHG shower.  You may shave your face/neck. Please follow these instructions carefully:  1.  Shower with CHG Soap the night before surgery and the  morning of Surgery.  2.  If you choose to wash your hair, wash your hair first as usual with your  normal  shampoo.  3.  After you shampoo, rinse your hair and body thoroughly to remove the  shampoo.                           4.  Use CHG as you would any other liquid  soap.  You can apply chg directly  to the skin and wash                       Gently with a scrungie or clean washcloth.  5.  Apply the CHG Soap to your body ONLY FROM THE NECK DOWN.   Do not use on face/ open                           Wound or open sores. Avoid contact with eyes, ears mouth and genitals (private parts).                       Wash face,  Genitals (private parts) with your normal soap.             6.  Wash thoroughly, paying special attention to the area where your surgery  will be performed.  7.  Thoroughly rinse your body with warm water from the neck down.  8.  DO NOT shower/wash with your normal soap after  using and rinsing off  the CHG Soap.                9.  Pat yourself dry with a clean towel.            10.  Wear clean pajamas.            11.  Place clean sheets on your bed the night of your first shower and do not  sleep with pets. Day of Surgery : Do not apply any lotions/deodorants the morning of surgery.  Please wear clean clothes to the hospital/surgery center.  FAILURE TO FOLLOW THESE INSTRUCTIONS MAY RESULT IN THE CANCELLATION OF YOUR SURGERY PATIENT SIGNATURE_________________________________  NURSE SIGNATURE__________________________________  ________________________________________________________________________  coughing and deep breathing exercises, leg exercises

## 2014-06-27 ENCOUNTER — Encounter (HOSPITAL_COMMUNITY): Payer: Self-pay

## 2014-06-27 ENCOUNTER — Encounter (HOSPITAL_COMMUNITY)
Admission: RE | Admit: 2014-06-27 | Discharge: 2014-06-27 | Disposition: A | Discharge status: Home / Self Care | Payer: 59 | Source: Ambulatory Visit | Attending: Urology | Admitting: Urology

## 2014-06-27 DIAGNOSIS — N200 Calculus of kidney: Secondary | ICD-10-CM | POA: Diagnosis present

## 2014-06-27 DIAGNOSIS — Z01812 Encounter for preprocedural laboratory examination: Secondary | ICD-10-CM | POA: Diagnosis present

## 2014-06-27 LAB — CBC
HCT: 41.4 % (ref 39.0–52.0)
Hemoglobin: 14.5 g/dL (ref 13.0–17.0)
MCH: 31.5 pg (ref 26.0–34.0)
MCHC: 35 g/dL (ref 30.0–36.0)
MCV: 90 fL (ref 78.0–100.0)
Platelets: 179 10*3/uL (ref 150–400)
RBC: 4.6 MIL/uL (ref 4.22–5.81)
RDW: 13.4 % (ref 11.5–15.5)
WBC: 5.3 10*3/uL (ref 4.0–10.5)

## 2014-06-27 LAB — BASIC METABOLIC PANEL
Anion gap: 9 (ref 5–15)
BUN: 16 mg/dL (ref 6–23)
CHLORIDE: 99 meq/L (ref 96–112)
CO2: 30 meq/L (ref 19–32)
Calcium: 9.4 mg/dL (ref 8.4–10.5)
Creatinine, Ser: 1.29 mg/dL (ref 0.50–1.35)
GFR calc Af Amer: 67 mL/min — ABNORMAL LOW (ref >90)
GFR calc non Af Amer: 57 mL/min — ABNORMAL LOW (ref >90)
Glucose, Bld: 101 mg/dL — ABNORMAL HIGH (ref 70–99)
POTASSIUM: 4.3 meq/L (ref 3.7–5.3)
Sodium: 138 mEq/L (ref 137–147)

## 2014-06-27 NOTE — Progress Notes (Signed)
EKG- 11/01/2013 EPIC  CXR- 05/30/2014 EPIC

## 2014-07-04 ENCOUNTER — Encounter (HOSPITAL_COMMUNITY)
Admission: RE | Disposition: A | Discharge status: Home / Self Care | Payer: Self-pay | Source: Ambulatory Visit | Attending: Urology

## 2014-07-04 ENCOUNTER — Ambulatory Visit (HOSPITAL_COMMUNITY): Payer: 59 | Admitting: Anesthesiology

## 2014-07-04 ENCOUNTER — Encounter (HOSPITAL_COMMUNITY): Payer: 59 | Admitting: Anesthesiology

## 2014-07-04 ENCOUNTER — Ambulatory Visit (HOSPITAL_COMMUNITY)
Admission: RE | Admit: 2014-07-04 | Discharge: 2014-07-04 | Disposition: A | Discharge status: Home / Self Care | Payer: 59 | Source: Ambulatory Visit | Attending: Urology | Admitting: Urology

## 2014-07-04 ENCOUNTER — Encounter (HOSPITAL_COMMUNITY): Payer: Self-pay | Admitting: *Deleted

## 2014-07-04 DIAGNOSIS — N200 Calculus of kidney: Secondary | ICD-10-CM

## 2014-07-04 DIAGNOSIS — E780 Pure hypercholesterolemia: Secondary | ICD-10-CM | POA: Diagnosis not present

## 2014-07-04 HISTORY — PX: CYSTOSCOPY WITH URETEROSCOPY AND STENT PLACEMENT: SHX6377

## 2014-07-04 HISTORY — PX: HOLMIUM LASER APPLICATION: SHX5852

## 2014-07-04 SURGERY — CYSTOURETEROSCOPY, WITH STENT INSERTION
Anesthesia: General

## 2014-07-04 MED ORDER — CISATRACURIUM BESYLATE (PF) 10 MG/5ML IV SOLN
INTRAVENOUS | Status: DC | PRN
  Administered 2014-07-04: 2 mg via INTRAVENOUS
  Administered 2014-07-04: 8 mg via INTRAVENOUS

## 2014-07-04 MED ORDER — ONDANSETRON HCL 4 MG/2ML IJ SOLN
INTRAMUSCULAR | Status: DC | PRN
  Administered 2014-07-04 (×2): 2 mg via INTRAVENOUS

## 2014-07-04 MED ORDER — NEOSTIGMINE METHYLSULFATE 10 MG/10ML IV SOLN
INTRAVENOUS | Status: DC | PRN
  Administered 2014-07-04: 5 mg via INTRAVENOUS

## 2014-07-04 MED ORDER — PHENYLEPHRINE HCL 10 MG/ML IJ SOLN
INTRAMUSCULAR | Status: DC | PRN
  Administered 2014-07-04 (×3): 40 ug via INTRAVENOUS

## 2014-07-04 MED ORDER — MIDAZOLAM HCL 2 MG/2ML IJ SOLN
INTRAMUSCULAR | Status: AC
  Filled 2014-07-04: qty 2

## 2014-07-04 MED ORDER — 0.9 % SODIUM CHLORIDE (POUR BTL) OPTIME
TOPICAL | Status: DC | PRN
  Administered 2014-07-04: 1000 mL

## 2014-07-04 MED ORDER — CIPROFLOXACIN IN D5W 400 MG/200ML IV SOLN
400.0000 mg | INTRAVENOUS | Status: AC
  Administered 2014-07-04: 400 mg via INTRAVENOUS

## 2014-07-04 MED ORDER — LIDOCAINE HCL (CARDIAC) 20 MG/ML IV SOLN
INTRAVENOUS | Status: DC | PRN
  Administered 2014-07-04: 75 mg via INTRAVENOUS

## 2014-07-04 MED ORDER — GLYCOPYRROLATE 0.2 MG/ML IJ SOLN
INTRAMUSCULAR | Status: AC
  Filled 2014-07-04: qty 4

## 2014-07-04 MED ORDER — BELLADONNA ALKALOIDS-OPIUM 16.2-60 MG RE SUPP
RECTAL | Status: DC | PRN
  Administered 2014-07-04: 1 via RECTAL

## 2014-07-04 MED ORDER — TROSPIUM CHLORIDE ER 60 MG PO CP24: 60.0000 mg | ORAL_CAPSULE | Freq: Every day | ORAL | Status: DC

## 2014-07-04 MED ORDER — DEXAMETHASONE SODIUM PHOSPHATE 10 MG/ML IJ SOLN
INTRAMUSCULAR | Status: AC
  Filled 2014-07-04: qty 1

## 2014-07-04 MED ORDER — GLYCOPYRROLATE 0.2 MG/ML IJ SOLN
INTRAMUSCULAR | Status: DC | PRN
  Administered 2014-07-04: .8 mg via INTRAVENOUS

## 2014-07-04 MED ORDER — LACTATED RINGERS IV SOLN: INTRAVENOUS | Status: DC

## 2014-07-04 MED ORDER — DEXAMETHASONE SODIUM PHOSPHATE 10 MG/ML IJ SOLN
INTRAMUSCULAR | Status: DC | PRN
  Administered 2014-07-04: 10 mg via INTRAVENOUS

## 2014-07-04 MED ORDER — CIPROFLOXACIN IN D5W 400 MG/200ML IV SOLN
INTRAVENOUS | Status: AC
  Filled 2014-07-04: qty 200

## 2014-07-04 MED ORDER — FENTANYL CITRATE 0.05 MG/ML IJ SOLN
INTRAMUSCULAR | Status: DC
Start: 2014-07-04 — End: 2014-07-04
  Filled 2014-07-04: qty 2

## 2014-07-04 MED ORDER — NEOSTIGMINE METHYLSULFATE 10 MG/10ML IV SOLN
INTRAVENOUS | Status: AC
  Filled 2014-07-04: qty 1

## 2014-07-04 MED ORDER — FENTANYL CITRATE 0.05 MG/ML IJ SOLN
INTRAMUSCULAR | Status: AC
  Filled 2014-07-04: qty 5

## 2014-07-04 MED ORDER — LIDOCAINE HCL 2 % EX GEL
CUTANEOUS | Status: AC
  Filled 2014-07-04: qty 10

## 2014-07-04 MED ORDER — PHENAZOPYRIDINE HCL 200 MG PO TABS: 200.0000 mg | ORAL_TABLET | Freq: Three times a day (TID) | ORAL | Status: DC | PRN

## 2014-07-04 MED ORDER — BELLADONNA ALKALOIDS-OPIUM 16.2-60 MG RE SUPP
RECTAL | Status: AC
  Filled 2014-07-04: qty 1

## 2014-07-04 MED ORDER — LACTATED RINGERS IV SOLN
INTRAVENOUS | Status: DC | PRN
  Administered 2014-07-04 (×2): via INTRAVENOUS

## 2014-07-04 MED ORDER — FENTANYL CITRATE 0.05 MG/ML IJ SOLN
INTRAMUSCULAR | Status: DC | PRN
  Administered 2014-07-04 (×5): 50 ug via INTRAVENOUS

## 2014-07-04 MED ORDER — ACETAMINOPHEN-CODEINE #3 300-30 MG PO TABS: 1.0000 | ORAL_TABLET | ORAL | Status: DC | PRN

## 2014-07-04 MED ORDER — MIDAZOLAM HCL 5 MG/5ML IJ SOLN
INTRAMUSCULAR | Status: DC | PRN
  Administered 2014-07-04 (×2): 1 mg via INTRAVENOUS

## 2014-07-04 MED ORDER — FENTANYL CITRATE 0.05 MG/ML IJ SOLN
25.0000 ug | INTRAMUSCULAR | Status: DC | PRN
  Administered 2014-07-04 (×3): 50 ug via INTRAVENOUS

## 2014-07-04 MED ORDER — CIPROFLOXACIN HCL 500 MG PO TABS: 500.0000 mg | ORAL_TABLET | Freq: Once | ORAL | Status: DC

## 2014-07-04 MED ORDER — PROPOFOL 10 MG/ML IV BOLUS
INTRAVENOUS | Status: DC | PRN
  Administered 2014-07-04: 200 mg via INTRAVENOUS

## 2014-07-04 MED ORDER — CISATRACURIUM BESYLATE 20 MG/10ML IV SOLN
INTRAVENOUS | Status: AC
Start: 2014-07-04 — End: 2014-07-04
  Filled 2014-07-04: qty 10

## 2014-07-04 MED ORDER — SODIUM CHLORIDE 0.9 % IR SOLN
Status: DC | PRN
  Administered 2014-07-04: 1000 mL

## 2014-07-04 MED ORDER — SUCCINYLCHOLINE CHLORIDE 20 MG/ML IJ SOLN
INTRAMUSCULAR | Status: DC | PRN
  Administered 2014-07-04: 100 mg via INTRAVENOUS

## 2014-07-04 MED ORDER — ONDANSETRON HCL 4 MG/2ML IJ SOLN
INTRAMUSCULAR | Status: AC
  Filled 2014-07-04: qty 2

## 2014-07-04 MED ORDER — PROPOFOL 10 MG/ML IV BOLUS
INTRAVENOUS | Status: AC
  Filled 2014-07-04: qty 20

## 2014-07-04 MED ORDER — IOHEXOL 300 MG/ML  SOLN
INTRAMUSCULAR | Status: DC | PRN
  Administered 2014-07-04: 50 mL

## 2014-07-04 MED ORDER — LIDOCAINE HCL (CARDIAC) 20 MG/ML IV SOLN
INTRAVENOUS | Status: AC
  Filled 2014-07-04: qty 5

## 2014-07-04 MED ORDER — ACETAMINOPHEN 10 MG/ML IV SOLN
1000.0000 mg | Freq: Once | INTRAVENOUS | Status: AC
  Administered 2014-07-04: 1000 mg via INTRAVENOUS
  Filled 2014-07-04: qty 100

## 2014-07-04 MED ORDER — FENTANYL CITRATE 0.05 MG/ML IJ SOLN
INTRAMUSCULAR | Status: AC
  Filled 2014-07-04: qty 2

## 2014-07-04 MED ORDER — SODIUM CHLORIDE 0.9 % IR SOLN
Status: DC | PRN
  Administered 2014-07-04: 3000 mL

## 2014-07-04 MED ORDER — LIDOCAINE HCL 2 % EX GEL
CUTANEOUS | Status: DC | PRN
  Administered 2014-07-04: 1

## 2014-07-04 SURGICAL SUPPLY — 23 items
BAG URO CATCHER STRL LF (DRAPE) ×4 IMPLANT
BASKET LASER NITINOL 1.9FR (BASKET) IMPLANT
BASKET ZERO TIP NITINOL 2.4FR (BASKET) IMPLANT
CATH URET 5FR 28IN OPEN ENDED (CATHETERS) ×4 IMPLANT
CLOTH BEACON ORANGE TIMEOUT ST (SAFETY) ×4 IMPLANT
DRAPE CAMERA CLOSED 9X96 (DRAPES) ×4 IMPLANT
DRAPE LEGGING IMPERMEABLE (DRAPES) ×4 IMPLANT
EXTRACTOR STONE NITINOL NGAGE (UROLOGICAL SUPPLIES) ×4 IMPLANT
FIBER LASER FLEXIVA 200 (UROLOGICAL SUPPLIES) ×4 IMPLANT
FIBER LASER TRAC TIP (UROLOGICAL SUPPLIES) ×4 IMPLANT
GLOVE BIOGEL M STRL SZ7.5 (GLOVE) ×4 IMPLANT
GOWN SPEC L3 XXLG W/TWL (GOWN DISPOSABLE) ×4 IMPLANT
GOWN STRL REUS W/TWL XL LVL3 (GOWN DISPOSABLE) ×4 IMPLANT
GUIDEWIRE ANG ZIPWIRE 038X150 (WIRE) ×4 IMPLANT
GUIDEWIRE STR DUAL SENSOR (WIRE) ×8 IMPLANT
KIT BALLIN UROMAX 15FX10 (LABEL) ×2 IMPLANT
PACK CYSTO (CUSTOM PROCEDURE TRAY) ×4 IMPLANT
SET HIGH PRES BAL DIL (LABEL) ×2
SHEATH ACCESS URETERAL 54CM (SHEATH) ×4 IMPLANT
STENT CONTOUR 6FRX26X.038 (STENTS) ×4 IMPLANT
TUBE FEEDING 8FR 16IN STR KANG (MISCELLANEOUS) IMPLANT
TUBING CONNECTING 10 (TUBING) ×3 IMPLANT
TUBING CONNECTING 10' (TUBING) ×1

## 2014-07-04 NOTE — Interval H&P Note (Signed)
History and Physical Interval Note:  07/04/2014 11:11 AM  Jerry Rios  has presented today for surgery, with the diagnosis of LEFT NEPHROLITHISIS  The various methods of treatment have been discussed with the patient and family. After consideration of risks, benefits and other options for treatment, the patient has consented to  Procedure(s): LEFT URETEROSCOPY AND STENT PLACEMENT (Left)  LASER LITHOTRIPSY (N/A) as a surgical intervention .  The patient's history has been reviewed, patient examined, no change in status, stable for surgery.  I have reviewed the patient's chart and labs.  Questions were answered to the patient's satisfaction.     Louis Meckel W

## 2014-07-04 NOTE — Anesthesia Postprocedure Evaluation (Signed)
  Anesthesia Post-op Note  Patient: Jerry Rios  Procedure(s) Performed: Procedure(s) (LRB): CYSTOSCOPY WITH LEFT RETROGRADE PYLEOGRAM, LEFT URETERAL DILATION AND URETEROSCOPY, LASER LITHOTRISPY AND STENT PLACEMENT (Left)  LASER LITHOTRIPSY (N/A)  Patient Location: PACU  Anesthesia Type: General  Level of Consciousness: awake and alert   Airway and Oxygen Therapy: Patient Spontanous Breathing  Post-op Pain: mild  Post-op Assessment: Post-op Vital signs reviewed, Patient's Cardiovascular Status Stable, Respiratory Function Stable, Patent Airway and No signs of Nausea or vomiting  Last Vitals:  Filed Vitals:   07/04/14 1326  BP: 113/73  Pulse: 68  Temp: 36.4 C  Resp: 13    Post-op Vital Signs: stable   Complications: No apparent anesthesia complications

## 2014-07-04 NOTE — Anesthesia Procedure Notes (Addendum)
Procedure Name: LMA Insertion Date/Time: 07/04/2014 11:38 AM Performed by: Ofilia Neas Pre-anesthesia Checklist: Patient identified, Emergency Drugs available, Suction available, Patient being monitored and Timeout performed Patient Re-evaluated:Patient Re-evaluated prior to inductionOxygen Delivery Method: Circle system utilized Preoxygenation: Pre-oxygenation with 100% oxygen Intubation Type: IV induction LMA: LMA inserted LMA Size: 4.0 Number of attempts: 1 Placement Confirmation: positive ETCO2 Tube secured with: Tape Dental Injury: Teeth and Oropharynx as per pre-operative assessment    Procedure Name: Intubation Date/Time: 07/04/2014 11:51 AM Performed by: Ofilia Neas Pre-anesthesia Checklist: Patient identified, Emergency Drugs available, Suction available, Patient being monitored and Timeout performed Patient Re-evaluated:Patient Re-evaluated prior to inductionOxygen Delivery Method: Circle system utilized Preoxygenation: Pre-oxygenation with 100% oxygen Intubation Type: IV induction Ventilation: Mask ventilation without difficulty Laryngoscope Size: Mac and 4 Grade View: Grade II Tube type: Oral Tube size: 7.5 mm Number of attempts: 1 Airway Equipment and Method: Stylet Placement Confirmation: ETT inserted through vocal cords under direct vision,  positive ETCO2 and breath sounds checked- equal and bilateral Secured at: 21 cm Tube secured with: Tape Dental Injury: Teeth and Oropharynx as per pre-operative assessment  Comments: Cords visualized with cricoid pressure/head lift.

## 2014-07-04 NOTE — Discharge Instructions (Signed)
DISCHARGE INSTRUCTIONS FOR KIDNEY STONE/URETERAL STENT   MEDICATIONS:  1.  Resume all your other meds from home - except do not take any extra narcotic pain meds that you may have at home.  2. Trospium is to prevent bladder spasms and help reduce urinary frequency. 3. Pyridium is to help with the burning/stinging when you urinate. 4. Tylenol with codeine is for moderate/severe pain, otherwise taking upto 1000mg  every 6 hours of plainTylenol will help treat your pain.  Do not take both at the same time. 5. Take Cipro one hour prior to removal of your stent.   ACTIVITY:  1. No strenuous activity x 1week  2. No driving while on narcotic pain medications  3. Drink plenty of water  4. Continue to walk at home - you can still get blood clots when you are at home, so keep active, but don't over do it.  5. May return to work/school tomorrow or when you feel ready   BATHING:  1. You can shower and we recommend daily showers  2. You have a string coming from your urethra: The stent string is attached to your ureteral stent. Do not pull on this.   SIGNS/SYMPTOMS TO CALL:  Please call us if you have a fever greater than 101.5, uncontrolled nausea/vomiting, uncontrolled pain, dizziness, unable to urinate, bloody urine, chest pain, shortness of breath, leg swelling, leg pain, redness around wound, drainage from wound, or any other concerns or questions.   You can reach [place] at [phone].   FOLLOW-UP:  1. You have an appointment in 6 weeks with a ultrasound of your kidneys prior.   2. You have a string attached to your stent, you may remove it on 07/09/14. To do this, pull the strings until the stents are completely removed. You may feel an odd sensation in your back.        General Anesthesia, Care After Refer to this sheet in the next few weeks. These instructions provide you with information on caring for yourself after your procedure. Your health care provider may also give you more  specific instructions. Your treatment has been planned according to current medical practices, but problems sometimes occur. Call your health care provider if you have any problems or questions after your procedure. WHAT TO EXPECT AFTER THE PROCEDURE After the procedure, it is typical to experience:  Sleepiness.  Nausea and vomiting. HOME CARE INSTRUCTIONS  For the first 24 hours after general anesthesia:  Have a responsible person with you.  Do not drive a car. If you are alone, do not take public transportation.  Do not drink alcohol.  Do not take medicine that has not been prescribed by your health care provider.  Do not sign important papers or make important decisions.  You may resume a normal diet and activities as directed by your health care provider.  Change bandages (dressings) as directed.  If you have questions or problems that seem related to general anesthesia, call the hospital and ask for the anesthetist or anesthesiologist on call. SEEK MEDICAL CARE IF:  You have nausea and vomiting that continue the day after anesthesia.  You develop a rash. SEEK IMMEDIATE MEDICAL CARE IF:   You have difficulty breathing.  You have chest pain.  You have any allergic problems. Document Released: 12/19/2000 Document Revised: 09/17/2013 Document Reviewed: 03/28/2013 [facility] Patient Information 2015 [place], [place]. This information is not intended to replace advice given to you by your health care provider. Make sure you discuss any questions  you have with your health care provider.

## 2014-07-04 NOTE — Transfer of Care (Signed)
Immediate Anesthesia Transfer of Care Note  Patient: Jerry Rios  Procedure(s) Performed: Procedure(s): CYSTOSCOPY WITH LEFT RETROGRADE PYLEOGRAM, LEFT URETERAL DILATION AND URETEROSCOPY, LASER LITHOTRISPY AND STENT PLACEMENT (Left)  LASER LITHOTRIPSY (N/A)  Patient Location: PACU  Anesthesia Type:General  Level of Consciousness: awake, alert , oriented, patient cooperative and responds to stimulation  Airway & Oxygen Therapy: Patient Spontanous Breathing and Patient connected to face mask oxygen  Post-op Assessment: Report given to PACU RN, Post -op Vital signs reviewed and stable and Patient moving all extremities  Post vital signs: Reviewed and stable  Complications: No apparent anesthesia complications

## 2014-07-04 NOTE — Op Note (Signed)
Preoperative diagnosis:  Left renal pelvis stone  Postoperative diagnosis:  1. As above   Procedure: Cystoscopy Left ureteroscopy, laser lithotripsy, left ureteral stent placement Left retrograde pyelogram with interpretation   Surgeon: [name], MD  Anesthesia: General  Complications: None  Intraoperative findings: Left Retrograde pyelogram revealed a normal caliber ureter with no obvious filling defects, the left collecting system was noted to have multiple narrow calyces without evidence of filling defects or abnormality.  The patient's stone was located in the lower pole of the left kidney and had a small film of renal parenchyma overlying it which I had to laser prior to removing the stone. The stone was removed in its entirety, the patient stone free at the end of the case. A 26 cm x6 French ureteral stent was placed at the end of case.  EBL: Minimal  Specimens:  left kidney stones were taken to [facility] and sent to the lab for stone composition analysis.  Indication: Jerry Rios is a 63 y.o. patient with left intermittent flank pain.   workup revealed a stone within the left lower pole the kidney, the patient was counseled that this may or may not be causing his pain but the patient has chosen to proceed with stone removal. After reviewing the management options for treatment, he elected to proceed with the above surgical procedure(s). We have discussed the potential benefits and risks of the procedure, side effects of the proposed treatment, the likelihood of the patient achieving the goals of the procedure, and any potential problems that might occur during the procedure or recuperation. Informed consent has been obtained.  Description of procedure:  The patient was taken to the operating room and general anesthesia was induced.  The patient was placed in the dorsal lithotomy position, prepped and draped in the usual sterile fashion, and preoperative  antibiotics were administered. A preoperative time-out was performed.   At 28 22 French cystoscope was gently passed to the patient's urethra and into the bladder under visual guidance. It 3 or 60 cystoscopic evaluation was then performed with no unusual findings. The ureteral orifices were orthotopic. The prostate was noted to have a high median bar. There were no bladder tumors or mucosal abnormalities.  5 [place] open-ended ureteral catheter was inserted into the patient's left ear orifice and 10 cc on the day contrast was injected into the patient's left ureter.  The above findings were then noted. A wire was then passed through the catheter and the catheter removed over the wire. The cystoscope was then gently removed over the wire and a rigid ureteroscope gently inserted alongside the wire under visual guidance into the bladder. A second wire was then used to access the left ureteral orifice and guided the ureteroscope into the left ureter. The rigid scope was then passed to the proximal ureter with no additional findings. A second wire was then passed through the ureteroscope and the ureteroscope gently backed out. This point a 50 cm x12/14 French ureteral access sheath was then passed through the urethra over the second wire, however I was unable to get beyond that ureteral orifice. I then removed the access sheath and passed a 10 cm x 15 French UroMax balloon catheter over the second wire and dilated the ureteral orifice and distal ureter. After inflating the balloon for 2 minutes and then deflated the balloon I was able to pass a access sheath over the wire and into the proximal ureter. I then removed the wire and the  inner sheath and passed the flexible ureteroscope into the renal pelvis. Pyeloscopy was then performed in all the narrow calyces and ultimately stone was identified in the lower pole posterior calyx. However, it was noted to be underneath a calyceal tissue flap. I was able to basket some  of the stone but did ultimately have to laser the tissue to get to the bulk of the kidney stone. Using a 200  fiber with settings of 0.8 J x10 Hz the tissue was easily ablated and the stone identified and fragmented into multiple pieces. Using the in gauge basket the stone fragments were removed in their entirety. Contrast was injected through the ureteroscope and pyeloscopy performed using fluoroscopic guidance to ensure that all additional fragments had been removed. The scope was then gently backed out down the urethra and the access sheath removed under visual guidance with no damage noted to the ureter. The wire was then backloaded over the cystoscope and the cystoscope gently passed into the patient's urethra and into the bladder. A 26 cm x6 French double-J ureteral stent was then gently passed over the wire and into the left ureter. Once noted to be in the renal pelvis the wire was removed. A curl was noted in the renal pelvis as well as into the bladder. The bladder was then emptied. The stent string was then secured to the dorsal aspect of the patient's penis. The patient was subsequently extubated and returned back in excellent condition. [name], M.D.

## 2014-07-04 NOTE — H&P (Signed)
History of Present Illness Jerry Rios presents today with a 2 months history of mild to moderate left flank pain. He has a past history of kidney stone. He had ESL right renal stone in January 2012 and left renal stone in July 2012. He saw Dr Cathlean Cower last week. CT scan revealed 2 non obstructing left renal calculi; the largest one in the lower pole measures 6 mm. He does not have any voiding symptoms.   Past Medical History Problems  1. History of hypercholesterolemia (V12.29) 2. History of kidney stones (V13.01)  Surgical History Problems  1. History of Cystoscopy With Ureteroscopy With Manipulation Of Calculus 2. History of Lithotripsy 3. History of Lithotripsy 4. History of Lithotripsy  Current Meds 1. Aspirin 325 MG Oral Tablet; TAKE TABLET  PRN;  Therapy: (Recorded:09Sep2015) to Recorded 2. Cetirizine HCl - 10 MG Oral Tablet;  Therapy: (Recorded:09Sep2015) to Recorded 3. Flonase SUSP;  Therapy: (Recorded:27Oct2010) to Recorded 4. Protopic 0.03 % External Ointment;  Therapy: [id number] to Recorded 5. Vitamin D TABS;  Therapy: (Recorded:09Sep2015) to Recorded  Allergies Medication  1. No Known Drug Allergies  Family History Problems  1. Family history of Father Deceased At Age ____ : Mother   54 Heart failure 2. Family history of Hypercholesterolemia : Father 3. Family history of Mother Deceased At Age ____ : Mother   28 heart failure 4. Family history of Nephrolithiasis  Social History Problems  1. Alcohol Use   few per wk 2. Caffeine Use   2-3 per day 3. Marital History - Single 4. Never A Smoker 5. Denied: History of Tobacco Use  Review of Systems Genitourinary, constitutional, skin, eye, otolaryngeal, hematologic/lymphatic, cardiovascular, pulmonary, endocrine, musculoskeletal, gastrointestinal, neurological and psychiatric system(s) were reviewed and pertinent findings if present are noted.  Gastrointestinal: abdominal pain.    Vitals Vital Signs  [Data Includes: Last 1 Day]  Recorded: 09Sep2015 09:31AM  Blood Pressure: 103 / 59 Temperature: 98.1 F Heart Rate: 50  Physical Exam Constitutional: Well nourished and well developed . No acute distress.  ENT:. The ears and nose are normal in appearance.  Neck: The appearance of the neck is normal and no neck mass is present.  Pulmonary: No respiratory distress and normal respiratory rhythm and effort.  Cardiovascular: Heart rate and rhythm are normal . No peripheral edema.  Abdomen: The abdomen is soft and nontender. No masses are palpated. No CVA tenderness. No hernias are palpable. No hepatosplenomegaly noted.  Genitourinary: Examination of the penis demonstrates no discharge, no masses, no lesions and a normal meatus. The scrotum is without lesions. The right epididymis is palpably normal and non-tender. The left epididymis is palpably normal and non-tender. The right testis is non-tender and without masses. The left testis is non-tender and without masses.  Lymphatics: The femoral and inguinal nodes are not enlarged or tender.  Skin: Normal skin turgor, no visible rash and no visible skin lesions.  Neuro/Psych:. Mood and affect are appropriate.    Results/Data Urine [Data Includes: Last 1 Day]   [id number]  COLOR YELLOW   APPEARANCE CLEAR   SPECIFIC GRAVITY 1.015   pH 5.5   GLUCOSE NEG mg/dL  BILIRUBIN NEG   KETONE NEG mg/dL  BLOOD NEG   PROTEIN NEG mg/dL  UROBILINOGEN 0.2 mg/dL  NITRITE NEG   LEUKOCYTE ESTERASE NEG    I independently reviewed the CT scan and the findings are as noted above. There is also sigmoid diverticulosis without evidence of diverticulitis.   Assessment Assessed  1. Kidney stone on  left side (592.0)  Plan I doubt his pain is related to the non obstructing renal calculi. I told him that we could remove the stones and find out if the pain goes away. I discussed the treatment options: ESL, ureteroscopy, PCNL. He would consider ureteroscopy since he  already had ESL. The risks of the procedure include but are not limited to hemorrhage, infection, ureteral injury, inability to remove the stones . He understands and would like to proceed.

## 2014-07-04 NOTE — Anesthesia Preprocedure Evaluation (Addendum)
Anesthesia Evaluation  Patient identified by MRN, date of birth, ID band Patient awake    Reviewed: Allergy & Precautions, H&P , NPO status , Patient's Chart, lab work & pertinent test results  Airway Mallampati: II TM Distance: >3 FB Neck ROM: full    Dental no notable dental hx. (+) Teeth Intact, Dental Advisory Given   Pulmonary neg pulmonary ROS, former smoker,  breath sounds clear to auscultation  Pulmonary exam normal       Cardiovascular Exercise Tolerance: Good negative cardio ROS  Rhythm:regular Rate:Normal     Neuro/Psych negative neurological ROS  negative psych ROS   GI/Hepatic negative GI ROS, Neg liver ROS,   Endo/Other  negative endocrine ROS  Renal/GU negative Renal ROS  negative genitourinary   Musculoskeletal   Abdominal   Peds  Hematology negative hematology ROS (+)   Anesthesia Other Findings   Reproductive/Obstetrics negative OB ROS                          Anesthesia Physical Anesthesia Plan  ASA: II  Anesthesia Plan: General   Post-op Pain Management:    Induction: Intravenous  Airway Management Planned: LMA  Additional Equipment:   Intra-op Plan:   Post-operative Plan:   Informed Consent: I have reviewed the patients History and Physical, chart, labs and discussed the procedure including the risks, benefits and alternatives for the proposed anesthesia with the patient or authorized representative who has indicated his/her understanding and acceptance.   Dental Advisory Given  Plan Discussed with: CRNA and Surgeon  Anesthesia Plan Comments:         Anesthesia Quick Evaluation

## 2014-07-07 ENCOUNTER — Encounter (HOSPITAL_COMMUNITY): Payer: Self-pay | Admitting: Urology

## 2014-08-13 ENCOUNTER — Ambulatory Visit (INDEPENDENT_AMBULATORY_CARE_PROVIDER_SITE_OTHER): Payer: 59 | Admitting: Internal Medicine

## 2014-08-13 ENCOUNTER — Encounter: Payer: Self-pay | Admitting: Internal Medicine

## 2014-08-13 VITALS — BP 80/60 | HR 72 | Ht 67.0 in | Wt 148.6 lb

## 2014-08-13 DIAGNOSIS — R1032 Left lower quadrant pain: Secondary | ICD-10-CM

## 2014-08-13 DIAGNOSIS — N200 Calculus of kidney: Secondary | ICD-10-CM

## 2014-08-13 DIAGNOSIS — R933 Abnormal findings on diagnostic imaging of other parts of digestive tract: Secondary | ICD-10-CM

## 2014-08-13 DIAGNOSIS — R1084 Generalized abdominal pain: Secondary | ICD-10-CM

## 2014-08-13 NOTE — Progress Notes (Signed)
HISTORY OF PRESENT ILLNESS:  Jerry Rios is a 63 y.o. male  Sent by his primary care physician regarding two-month history of left lower quadrant/left side abdominal pain. He was initially evaluated by Dr. Cathlean Cower 05/30/2014. Previous colonoscopy in February 2013 revealed moderate diverticulosis but was otherwise normal. CT scan of the abdomen and pelvis was performed without contrast that same day. The examination revealed nonobstructing left nephrolithiasis. As well, several left upper quadrant small bowel loops which were possibly prominent. The patient was subsequently seen by urology and on 07/04/2014 underwent cystoscopy with left ureteroscopy, laser lithotripsy, and left ureteral stent. The patient states that his abdominal pain resolved about one week prior to this procedure. However, he did report recurrence of similar discomfort with the passage of small stone fragments. No problems thereafter and stent removed. He has had no complaints since. At no point was he having upper abdominal pain, nausea, vomiting, or change in bowel habits. He does report 18 pound weight loss over the past year. Dietary change included elimination of bread. CBC from October as well as basic metabolic panel were normal.  REVIEW OF SYSTEMS:  All non-GI ROS negative upon comprehensive and complete review   Past Medical History  Diagnosis Date  . Insomnia   . Allergy   . Hyperlipidemia   . History of nephrolithiasis   . BPH (benign prostatic hypertrophy)   . Diverticulosis of colon   . Hx of colonic polyp   . History of shingles   . Libido, decreased 10/04/2011  . Vitiligo 10/31/2012    Past Surgical History  Procedure Laterality Date  . Tonsillectomy    . Lithotrypsy      x 3  . Cystoscopy with ureteroscopy and stent placement Left 07/04/2014    Procedure: CYSTOSCOPY WITH LEFT RETROGRADE PYLEOGRAM, LEFT URETERAL DILATION AND URETEROSCOPY, LASER LITHOTRISPY AND STENT PLACEMENT;  Surgeon: [name], MD;  Location: [facility];  Service: Urology;  Laterality: Left;  . Holmium laser application N/A 47/04/2955    Procedure:  LASER LITHOTRIPSY;  Surgeon: [name], MD;  Location: [facility];  Service: Urology;  Laterality: N/A;    Social History Jerry Rios  reports that he has quit smoking. He has never used smokeless tobacco. He reports that he drinks about 4.2 oz of alcohol per week. He reports that he does not use illicit drugs.  family history includes Colitis in his sister; Heart disease in his other; Hyperlipidemia in his other; Hypertension in his other; Ulcerative colitis in his sister.  Allergies  Allergen Reactions  . Atorvastatin     REACTION: myalgia  . Rosuvastatin     REACTION: myalgia  . Simvastatin     REACTION: myalgia  . Adhesive [Tape] Rash       PHYSICAL EXAMINATION: Vital signs: BP 80/60 mmHg  Pulse 72  Ht 5\' 7"  (1.702 m)  Wt 148 lb 9.6 oz (67.405 kg)  BMI 23.27 kg/m2 (8 pound documented weight loss over the past 20 months) General: Well-developed, well-nourished, no acute distress HEENT: Sclerae are anicteric, conjunctiva pink. Oral mucosa intact Lungs: Clear Heart: Regular Abdomen: soft, nontender, nondistended, no obvious ascites, no peritoneal signs, normal bowel sounds. No organomegaly. Extremities: No edema Psychiatric: alert and oriented x3. Cooperative   ASSESSMENT:  #1. Recent problems with left lower quadrant/left flank pain likely secondary to left-sided nephrolithiasis. No recurrent problems since urologic intervention. #2. Prior history of colon polyps. 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 Follow-up examination, February 2013,  negative for neoplasia. Moderate diverticulosis present #3. Questionable prominent loops of small bowel as described. Noncontrast scan. Unlikely to be clinically significant based on the absence of relevance symptoms #4. Weight loss. Patient reports 18 pound weight loss over the past year.  However, 8 pound weight loss over the past 20 months documented   PLAN:  #1. Observation at this time. Should he have recurrent left-sided pain, repeat urologic assessment would be the first step. If urologic evaluation negative, then would consider repeating a CT scan of the abdomen and pelvis with contrast enhancement. I offered this at this time, but he is comparable with the plan as outlined. He will return to the care of Dr. [name]. GI follow-up as needed. #2. Surveillance colonoscopy around February 2018

## 2014-08-13 NOTE — Patient Instructions (Signed)
Please follow up with Dr. Perry as needed 

## 2014-08-25 ENCOUNTER — Other Ambulatory Visit: Payer: Self-pay | Admitting: Internal Medicine

## 2014-10-27 DIAGNOSIS — I639 Cerebral infarction, unspecified: Secondary | ICD-10-CM

## 2014-10-27 HISTORY — DX: Cerebral infarction, unspecified: I63.9

## 2014-11-16 ENCOUNTER — Encounter: Payer: Self-pay | Admitting: Internal Medicine

## 2014-11-18 ENCOUNTER — Encounter: Payer: Self-pay | Admitting: Internal Medicine

## 2014-11-18 ENCOUNTER — Ambulatory Visit (INDEPENDENT_AMBULATORY_CARE_PROVIDER_SITE_OTHER): Payer: 59 | Admitting: Internal Medicine

## 2014-11-18 VITALS — BP 110/70 | HR 62 | Temp 98.3°F | Resp 18 | Ht 67.0 in | Wt 155.0 lb

## 2014-11-18 DIAGNOSIS — G459 Transient cerebral ischemic attack, unspecified: Secondary | ICD-10-CM

## 2014-11-18 DIAGNOSIS — Z0189 Encounter for other specified special examinations: Secondary | ICD-10-CM

## 2014-11-18 DIAGNOSIS — Z0000 Encounter for general adult medical examination without abnormal findings: Secondary | ICD-10-CM

## 2014-11-18 DIAGNOSIS — E785 Hyperlipidemia, unspecified: Secondary | ICD-10-CM

## 2014-11-18 DIAGNOSIS — R739 Hyperglycemia, unspecified: Secondary | ICD-10-CM

## 2014-11-18 MED ORDER — PRAVASTATIN SODIUM 20 MG PO TABS: 20.0000 mg | ORAL_TABLET | Freq: Every day | ORAL | Status: DC

## 2014-11-18 MED ORDER — PANTOPRAZOLE SODIUM 40 MG PO TBEC: 40.0000 mg | DELAYED_RELEASE_TABLET | Freq: Every day | ORAL | Status: DC

## 2014-11-18 NOTE — Progress Notes (Signed)
Pre visit review using our clinic review tool, if applicable. No additional management support is needed unless otherwise documented below in the visit note. 

## 2014-11-18 NOTE — Progress Notes (Signed)
Subjective:    Patient ID: Jerry Rios, male    DOB: [birth year], 64 y.o.   MRN: [id number]  HPI  Here with c/o sudden new onset Friday evening feb 19 dizziness, HA assoc with right leg numbness.  Called EMS who seemed surprised he appeared to be not incapacitated, answered the door himself. Right leg numbness lasted 30 min, but dizziness/HA lasted several more hours after. Did not have slurred speech, drooling or swallowing problem.   Did not go to ER.  Had not been taking his Aspirin prior, only taking occasionaly.  No prior hx of TIA or stroke.  No recent imaging or evaluation for such.  BP per EMS at the time very high at 170/90, but has been improved since symptoms resolved.  No further symptoms since initial attack.  Pt denies chest pain, increased sob or doe, wheezing, orthopnea, PND, increased LE swelling, palpitations, dizziness or syncope.   Pt denies polydipsia, polyuria.  Has been taking the asa 325 daily since the attack, though hesitates to take further due to hx of gastric irritation even with ecotrin. Past Medical History  Diagnosis Date  . Insomnia   . Allergy   . Hyperlipidemia   . History of nephrolithiasis   . BPH (benign prostatic hypertrophy)   . Diverticulosis of colon   . Hx of colonic polyp   . History of shingles   . Libido, decreased 10/04/2011  . Vitiligo 10/31/2012   Past Surgical History  Procedure Laterality Date  . Tonsillectomy    . Lithotrypsy      x 3  . Cystoscopy with ureteroscopy and stent placement Left 07/04/2014    Procedure: CYSTOSCOPY WITH LEFT RETROGRADE PYLEOGRAM, LEFT URETERAL DILATION AND URETEROSCOPY, LASER LITHOTRISPY AND STENT PLACEMENT;  Surgeon: [name], MD;  Location: [facility];  Service: Urology;  Laterality: Left;  . Holmium laser application N/A 02/25/3761    Procedure:  LASER LITHOTRIPSY;  Surgeon: [name], MD;  Location: [facility];  Service: Urology;  Laterality: N/A;    reports that he has quit smoking. He has  never used smokeless tobacco. He reports that he drinks about 4.2 oz of alcohol per week. He reports that he does not use illicit drugs. family history includes Colitis in his sister; Heart disease in his other; Hyperlipidemia in his other; Hypertension in his other; Ulcerative colitis in his sister. Allergies  Allergen Reactions  . Atorvastatin     REACTION: myalgia  . Rosuvastatin     REACTION: myalgia  . Simvastatin     REACTION: myalgia  . Adhesive [Tape] Rash   Current Outpatient Prescriptions on File Prior to Visit  Medication Sig Dispense Refill  . aspirin 325 MG tablet Take 325 mg by mouth once as needed for mild pain.    . bisacodyl (DULCOLAX) 5 MG EC tablet Take 5 mg by mouth daily as needed.    . cetirizine (ZYRTEC) 10 MG tablet Take 10 mg by mouth daily as needed for allergies.    . cholecalciferol (VITAMIN D) 1000 UNITS tablet Take 1,000 Units by mouth every morning.    Marland Kitchen co-enzyme Q-10 30 MG capsule Take 100 mg by mouth daily.    Marland Kitchen docusate sodium (COLACE) 100 MG capsule Take 100 mg by mouth daily as needed for mild constipation.    . fluticasone (FLONASE) 50 MCG/ACT nasal spray Place 1 spray into both nostrils daily.    . Melatonin 5 MG TABS Take 1 tablet by mouth at bedtime.     Marland Kitchen  metroNIDAZOLE (METROGEL) 1 % gel Apply 1 application topically daily as needed (rosacea).    . Naphazoline-Pheniramine (OPCON-A OP) Apply 1 drop to eye daily.    Marland Kitchen OVER THE COUNTER MEDICATION Trace mineral supplement daily- 20 drops    . psyllium (METAMUCIL) 58.6 % powder Take 1 packet by mouth daily.     No current facility-administered medications on file prior to visit.   Review of Systems  Constitutional: Negative for unusual diaphoresis or other sweats  HENT: Negative for ringing in ear Eyes: Negative for double vision or worsening visual disturbance.  Respiratory: Negative for choking and stridor.   Gastrointestinal: Negative for vomiting or other signifcant bowel  change Genitourinary: Negative for hematuria or decreased urine volume.  Musculoskeletal: Negative for other MSK pain or swelling Skin: Negative for color change and worsening wound.  Neurological: Negative for tremors and numbness other than noted  Psychiatric/Behavioral: Negative for decreased concentration or agitation other than above       Objective:   Physical Exam BP 110/70 mmHg  Pulse 62  Temp(Src) 98.3 F (36.8 C) (Oral)  Resp 18  Ht 5\' 7"  (1.702 m)  Wt 155 lb (70.308 kg)  BMI 24.27 kg/m2  SpO2 98% VS noted, not ill appearing Constitutional: Pt appears well-developed, well-nourished.  HENT: Head: NCAT.  Right Ear: External ear normal.  Left Ear: External ear normal.  Eyes: . Pupils are equal, round, and reactive to light. Conjunctivae and EOM are normal Neck: Normal range of motion. Neck supple.  Cardiovascular: Normal rate and regular rhythm.   Pulmonary/Chest: Effort normal and breath sounds without rales or wheezing.  Abd:  Soft, NT, ND, + BS Neurological: Pt is alert. Not confused , cn 2-12 intact, motor 5/5 intact, sens/dtr/gait intact, FTN normal Skin: Skin is warm. No rash Psychiatric: Pt behavior is normal. No agitation.     Assessment & Plan:

## 2014-11-18 NOTE — Patient Instructions (Addendum)
Please continue the Aspirin 325 mg per day  Please take all new medication as prescribed - the protonix to help irritation from the aspirin  Please take all new medication as prescribed - the low dose statin  You will be contacted regarding the referral for: MRI head, carotid ultrasound, echocardiogram, Neurology, and Lipid clinic  Please return in 1 months, or sooner if needed, with Lab testing done 3-5 days before (the office will call)

## 2014-11-19 ENCOUNTER — Ambulatory Visit
Admission: RE | Admit: 2014-11-19 | Discharge: 2014-11-19 | Disposition: A | Discharge status: Home / Self Care | Payer: 59 | Source: Ambulatory Visit | Attending: Internal Medicine | Admitting: Internal Medicine

## 2014-11-19 ENCOUNTER — Telehealth: Payer: Self-pay | Admitting: Internal Medicine

## 2014-11-19 DIAGNOSIS — R739 Hyperglycemia, unspecified: Secondary | ICD-10-CM

## 2014-11-19 DIAGNOSIS — G459 Transient cerebral ischemic attack, unspecified: Secondary | ICD-10-CM

## 2014-11-19 HISTORY — DX: Hyperglycemia, unspecified: R73.9

## 2014-11-19 NOTE — Telephone Encounter (Signed)
Echocardiogram and MRI must be pre-certified before we can schedule the patient. Currently the office note is not completed, so I have no information to give his insurance to get these tests approved. I spoke with the patient and he is aware we are we are working on these tests. Dr. [name], can you complete the office note and route this back to me and I'll get to it ASAP? Thank you!

## 2014-11-19 NOTE — Telephone Encounter (Signed)
Pt is scheduled for MRI, echo, and carotid duplex and is aware of his appts.

## 2014-11-19 NOTE — Assessment & Plan Note (Signed)
Mild, also for a1c

## 2014-11-19 NOTE — Telephone Encounter (Signed)
Patient states he was to have a stat test done.  He states that he was under the impression that he would hear something back this morning and has not.

## 2014-11-19 NOTE — Assessment & Plan Note (Addendum)
Exam benign, but c/w TIA vs CVA, to cont the asa 325, add statin low dose although he has been intolerant to higher doses in the past to mult statins, also for Head MRI, carotid duplex, echo, neurology consult; also for PPI to help with ? gsatric irritation with asa.  Note:  Total time for pt hx, exam, review of record with pt in the room, determination of diagnoses and plan for further eval and tx is > 40 min, with over 50% spent in coordination and counseling of patient

## 2014-11-19 NOTE — Telephone Encounter (Signed)
Note done

## 2014-11-19 NOTE — Assessment & Plan Note (Signed)
Very severe in past, has not been able to tolerate statins well due to myalgias, to add low dose statin again due to TIA, but also refer Lipid Clinic as is now high risk, and should be at least considered for PCSK9 inhibitor

## 2014-11-20 ENCOUNTER — Other Ambulatory Visit (HOSPITAL_COMMUNITY): Payer: 59

## 2014-11-21 ENCOUNTER — Ambulatory Visit (HOSPITAL_BASED_OUTPATIENT_CLINIC_OR_DEPARTMENT_OTHER): Payer: 59

## 2014-11-21 ENCOUNTER — Ambulatory Visit (HOSPITAL_COMMUNITY): Payer: 59 | Attending: Cardiovascular Disease | Admitting: Cardiology

## 2014-11-21 ENCOUNTER — Other Ambulatory Visit: Payer: Self-pay | Admitting: Internal Medicine

## 2014-11-21 DIAGNOSIS — E785 Hyperlipidemia, unspecified: Secondary | ICD-10-CM | POA: Diagnosis not present

## 2014-11-21 DIAGNOSIS — G459 Transient cerebral ischemic attack, unspecified: Secondary | ICD-10-CM | POA: Insufficient documentation

## 2014-11-21 DIAGNOSIS — Z87891 Personal history of nicotine dependence: Secondary | ICD-10-CM | POA: Insufficient documentation

## 2014-11-21 DIAGNOSIS — E041 Nontoxic single thyroid nodule: Secondary | ICD-10-CM

## 2014-11-21 DIAGNOSIS — R42 Dizziness and giddiness: Secondary | ICD-10-CM

## 2014-11-21 NOTE — Progress Notes (Signed)
2D Echo completed. 11/21/2014

## 2014-11-21 NOTE — Progress Notes (Signed)
Carotid duplex performed 

## 2014-11-25 ENCOUNTER — Ambulatory Visit (INDEPENDENT_AMBULATORY_CARE_PROVIDER_SITE_OTHER): Payer: 59 | Admitting: Pharmacist Clinician (PhC)/ Clinical Pharmacy Specialist

## 2014-11-25 VITALS — Ht 67.0 in | Wt 154.5 lb

## 2014-11-25 DIAGNOSIS — E785 Hyperlipidemia, unspecified: Secondary | ICD-10-CM

## 2014-11-25 NOTE — Patient Instructions (Signed)
Start Livalo 2 mg once daily.  Call in 3-4 weeks and let us know how you're tolerating this.    Watch your diet to include high fiber and low fat foods.    Start exercising regularly.  We can consider adding Zetia 10 mg daily once you've been on the Livalo for 4-6 weeks without problem  Repeat labs in 3 months

## 2014-11-27 ENCOUNTER — Ambulatory Visit
Admission: RE | Admit: 2014-11-27 | Discharge: 2014-11-27 | Disposition: A | Discharge status: Home / Self Care | Payer: 59 | Source: Ambulatory Visit | Attending: Internal Medicine | Admitting: Internal Medicine

## 2014-11-27 ENCOUNTER — Encounter: Payer: Self-pay | Admitting: Pharmacist Clinician (PhC)/ Clinical Pharmacy Specialist

## 2014-11-27 DIAGNOSIS — E041 Nontoxic single thyroid nodule: Secondary | ICD-10-CM

## 2014-11-27 MED ORDER — PITAVASTATIN CALCIUM 2 MG PO TABS: 2.0000 mg | ORAL_TABLET | Freq: Every day | ORAL | Status: DC

## 2014-11-27 NOTE — Assessment & Plan Note (Addendum)
Pt with elevated LDL at 206, unable to tolerate daily statin dosing.  Will try the patient on Livalo 2 mg daily.  Asked that he stop pravastatin, wait 7-10 days for muscle aches to ease off before starting the Livalo.  If he can tolerate this, will add Zetia to see how much of an LDL reduction we can get.  Unfortunately patient does not qualify for a PCSK-9 inhibitor at this time.  His [place] Criteria score is 4 (1 point for father having MI at 13 and 3 points for LDL of 206).  His insurance is [employer] which uses [facility].  They require a Dutch score of 9 for familial hypercholesterolemia or the presence of ASCVD.  Unfortunately at this time he does not meet their qualifications.  While his event at home last Friday may have been a TIA, he did not go to the hospital and thus no testing was done to confirm or deny.  We discussed the need to eat a heart healthy diet, high in fiber, and to increase his exercise to include at least 30 minutes 3-4 times per week.  At this time if he can tolerate the Livalo I would suggest adding on Zetia to see if the combination will give him a decrease in LDL.  We also discussed the possibility of weekly Crestor (rather than daily) as a possible option.  Asked that he call in 3-4 weeks and let us know how he's doing on Livalo.  Otherwise we will refer him back to his primary care physician for continued management.

## 2014-11-27 NOTE — Progress Notes (Signed)
11/27/2014 MARKIS LANGLAND 1951-04-18 [id number]   HPI:  Jerry Rios is a 64 y.o. male patient, referred by Dr [name], who presents today for a lipid clinic evaluation.  His history includes an episode of HA, dizziness and R leg numbness on 2/18.  He called EMS and was evaluated but did not go to ER.  Leg numbness resolved after 30 minutes, HA and dizziness several hours.  Pt has no history of stroke or TIA  RF:  Only hyperlipidemia  Meds: pravastatin 20 mg, only takes 1 tablet every 3-4 days due to myalgias; psyllium powder   Intolerant: atorvastatin, rosuvastatin, simvastatin  Tried in the past: ezetimibe and colevsevelam, discontinued both due to lack of efficacy   Family history:  Father deceased after MI at 49, mother deceased after MI at 59  Diet: varies, but does avoid fast foods, eats small portions, lots of chicken and fish, does not snack between meals  Exercise: previously jogged 1-2 days per week (stopped during winter months), recently joined gym   Labs:  Results for COSTAS, SENA (MRN [id number]) as of 11/27/2014 09:21  Ref. Range 10/04/2011 09:52 11/01/2013 09:42  Cholesterol Latest Range: 0-200 mg/dL 288 (H) 309 (H)  Triglycerides Latest Range: 0.0-149.0 mg/dL 65.0 68.0  HDL Latest Range: >39.00 mg/dL 84.40 88.70  Direct LDL Latest Units: mg/dL 176.3 206.1  VLDL Latest Range: 0.0-40.0 mg/dL 13.0 13.6  Total CHOL/HDL Ratio No range found 3 3      Current Outpatient Prescriptions  Medication Sig Dispense Refill  . aspirin 325 MG tablet Take 325 mg by mouth once as needed for mild pain.    . bisacodyl (DULCOLAX) 5 MG EC tablet Take 5 mg by mouth daily as needed.    . cetirizine (ZYRTEC) 10 MG tablet Take 10 mg by mouth daily as needed for allergies.    . cholecalciferol (VITAMIN D) 1000 UNITS tablet Take 1,000 Units by mouth every morning.    Marland Kitchen co-enzyme Q-10 30 MG capsule Take 100 mg by mouth daily.    Marland Kitchen docusate sodium (COLACE) 100 MG capsule Take 100 mg by  mouth daily as needed for mild constipation.    . fluticasone (FLONASE) 50 MCG/ACT nasal spray Place 1 spray into both nostrils daily.    . Melatonin 5 MG TABS Take 1 tablet by mouth at bedtime.     . metroNIDAZOLE (METROGEL) 1 % gel Apply 1 application topically daily as needed (rosacea).    . Naphazoline-Pheniramine (OPCON-A OP) Apply 1 drop to eye daily.    Marland Kitchen OVER THE COUNTER MEDICATION Trace mineral supplement daily- 20 drops    . pantoprazole (PROTONIX) 40 MG tablet Take 1 tablet (40 mg total) by mouth daily. 30 tablet 11  . pravastatin (PRAVACHOL) 20 MG tablet Take 1 tablet (20 mg total) by mouth daily. 90 tablet 3  . psyllium (METAMUCIL) 58.6 % powder Take 1 packet by mouth daily.     No current facility-administered medications for this visit.    Allergies  Allergen Reactions  . Atorvastatin     REACTION: myalgia  . Rosuvastatin     REACTION: myalgia  . Simvastatin     REACTION: myalgia  . Adhesive [Tape] Rash    Past Medical History  Diagnosis Date  . Insomnia   . Allergy   . Hyperlipidemia   . History of nephrolithiasis   . BPH (benign prostatic hypertrophy)   . Diverticulosis of colon   . Hx of colonic polyp   . History  of shingles   . Libido, decreased 10/04/2011  . Vitiligo 10/31/2012    Height 5\' 7"  (1.702 m), weight 154 lb 8 oz (70.081 kg).     Tommy Medal PharmD CPP [facility]

## 2014-11-28 ENCOUNTER — Other Ambulatory Visit: Payer: Self-pay | Admitting: Internal Medicine

## 2014-11-28 DIAGNOSIS — E042 Nontoxic multinodular goiter: Secondary | ICD-10-CM

## 2014-12-02 ENCOUNTER — Other Ambulatory Visit: Payer: Self-pay | Admitting: Internal Medicine

## 2014-12-02 ENCOUNTER — Other Ambulatory Visit (HOSPITAL_COMMUNITY)
Admission: RE | Admit: 2014-12-02 | Discharge: 2014-12-02 | Disposition: A | Discharge status: Home / Self Care | Payer: 59 | Source: Ambulatory Visit | Attending: Interventional Radiology | Admitting: Interventional Radiology

## 2014-12-02 ENCOUNTER — Ambulatory Visit
Admission: RE | Admit: 2014-12-02 | Discharge: 2014-12-02 | Disposition: A | Discharge status: Home / Self Care | Payer: 59 | Source: Ambulatory Visit | Attending: Internal Medicine | Admitting: Internal Medicine

## 2014-12-02 DIAGNOSIS — E041 Nontoxic single thyroid nodule: Secondary | ICD-10-CM

## 2014-12-02 DIAGNOSIS — E042 Nontoxic multinodular goiter: Secondary | ICD-10-CM

## 2014-12-16 ENCOUNTER — Ambulatory Visit: Payer: 59 | Admitting: Internal Medicine

## 2014-12-18 ENCOUNTER — Ambulatory Visit: Payer: 59 | Admitting: Cardiovascular Disease

## 2014-12-23 ENCOUNTER — Other Ambulatory Visit (INDEPENDENT_AMBULATORY_CARE_PROVIDER_SITE_OTHER): Payer: 59

## 2014-12-23 DIAGNOSIS — R739 Hyperglycemia, unspecified: Secondary | ICD-10-CM | POA: Diagnosis not present

## 2014-12-23 DIAGNOSIS — Z0000 Encounter for general adult medical examination without abnormal findings: Secondary | ICD-10-CM

## 2014-12-23 DIAGNOSIS — Z0189 Encounter for other specified special examinations: Secondary | ICD-10-CM | POA: Diagnosis not present

## 2014-12-23 LAB — LIPID PANEL
CHOL/HDL RATIO: 3
Cholesterol: 231 mg/dL — ABNORMAL HIGH (ref 0–200)
HDL: 80.1 mg/dL (ref >39.00)
LDL Cholesterol: 138 mg/dL — ABNORMAL HIGH (ref 0–99)
NonHDL: 150.9
Triglycerides: 63 mg/dL (ref 0.0–149.0)
VLDL: 12.6 mg/dL (ref 0.0–40.0)

## 2014-12-23 LAB — BASIC METABOLIC PANEL
BUN: 15 mg/dL (ref 6–23)
CALCIUM: 9.5 mg/dL (ref 8.4–10.5)
CO2: 31 mEq/L (ref 19–32)
CREATININE: 1.27 mg/dL (ref 0.40–1.50)
Chloride: 103 mEq/L (ref 96–112)
GFR: 60.62 mL/min (ref >60.00)
Glucose, Bld: 93 mg/dL (ref 70–99)
POTASSIUM: 3.8 meq/L (ref 3.5–5.1)
Sodium: 140 mEq/L (ref 135–145)

## 2014-12-23 LAB — TSH: TSH: 2.36 u[IU]/mL (ref 0.35–4.50)

## 2014-12-23 LAB — HEPATIC FUNCTION PANEL
ALBUMIN: 4 g/dL (ref 3.5–5.2)
ALT: 17 U/L (ref 0–53)
AST: 24 U/L (ref 0–37)
Alkaline Phosphatase: 30 U/L — ABNORMAL LOW (ref 39–117)
Bilirubin, Direct: 0.1 mg/dL (ref 0.0–0.3)
TOTAL PROTEIN: 6.5 g/dL (ref 6.0–8.3)
Total Bilirubin: 0.8 mg/dL (ref 0.2–1.2)

## 2014-12-23 LAB — CBC WITH DIFFERENTIAL/PLATELET
Basophils Absolute: 0 10*3/uL (ref 0.0–0.1)
Basophils Relative: 0.7 % (ref 0.0–3.0)
EOS PCT: 1.9 % (ref 0.0–5.0)
Eosinophils Absolute: 0.1 10*3/uL (ref 0.0–0.7)
HCT: 41.6 % (ref 39.0–52.0)
HEMOGLOBIN: 14.4 g/dL (ref 13.0–17.0)
Lymphocytes Relative: 39.4 % (ref 12.0–46.0)
Lymphs Abs: 2 10*3/uL (ref 0.7–4.0)
MCHC: 34.5 g/dL (ref 30.0–36.0)
MCV: 90.2 fl (ref 78.0–100.0)
MONO ABS: 0.5 10*3/uL (ref 0.1–1.0)
Monocytes Relative: 9.2 % (ref 3.0–12.0)
NEUTROS ABS: 2.5 10*3/uL (ref 1.4–7.7)
Neutrophils Relative %: 48.8 % (ref 43.0–77.0)
Platelets: 215 10*3/uL (ref 150.0–400.0)
RBC: 4.61 Mil/uL (ref 4.22–5.81)
RDW: 13.7 % (ref 11.5–15.5)
WBC: 5.1 10*3/uL (ref 4.0–10.5)

## 2014-12-23 LAB — URINALYSIS, ROUTINE W REFLEX MICROSCOPIC
Bilirubin Urine: NEGATIVE
KETONES UR: NEGATIVE
LEUKOCYTES UA: NEGATIVE
NITRITE: NEGATIVE
Specific Gravity, Urine: 1.015 (ref 1.000–1.030)
TOTAL PROTEIN, URINE-UPE24: NEGATIVE
URINE GLUCOSE: NEGATIVE
Urobilinogen, UA: 0.2 (ref 0.0–1.0)
pH: 6 (ref 5.0–8.0)

## 2014-12-23 LAB — HEMOGLOBIN A1C: Hgb A1c MFr Bld: 5.6 % (ref 4.6–6.5)

## 2014-12-23 LAB — PSA: PSA: 1.23 ng/mL (ref 0.10–4.00)

## 2014-12-25 ENCOUNTER — Ambulatory Visit (INDEPENDENT_AMBULATORY_CARE_PROVIDER_SITE_OTHER): Payer: 59 | Admitting: Internal Medicine

## 2014-12-25 ENCOUNTER — Encounter: Payer: Self-pay | Admitting: Internal Medicine

## 2014-12-25 VITALS — BP 124/70 | HR 45 | Temp 97.9°F | Resp 18 | Ht 67.0 in | Wt 158.0 lb

## 2014-12-25 DIAGNOSIS — Z0000 Encounter for general adult medical examination without abnormal findings: Secondary | ICD-10-CM | POA: Diagnosis not present

## 2014-12-25 DIAGNOSIS — E785 Hyperlipidemia, unspecified: Secondary | ICD-10-CM | POA: Diagnosis not present

## 2014-12-25 MED ORDER — PITAVASTATIN CALCIUM 2 MG PO TABS: 2.0000 mg | ORAL_TABLET | Freq: Every day | ORAL | Status: DC

## 2014-12-25 NOTE — Assessment & Plan Note (Signed)

## 2014-12-25 NOTE — Progress Notes (Signed)
Pre visit review using our clinic review tool, if applicable. No additional management support is needed unless otherwise documented below in the visit note. 

## 2014-12-25 NOTE — Patient Instructions (Addendum)
Please continue all other medications as before, and refills have been done if requested.  Please have the pharmacy call with any other refills you may need.  Please continue your efforts at being more active, low cholesterol diet, and weight control.  You are otherwise up to date with prevention measures today.  Please keep your appointments with your specialists as you may have planned  Please return in 6 months, or sooner if needed 

## 2014-12-25 NOTE — Progress Notes (Signed)
Subjective:    Patient ID: Jerry Rios, male    DOB: 25-Apr-1951, 64 y.o.   MRN: [id number]  HPI  Here for wellness and f/u;  Overall doing ok;  Pt denies Chest pain, worsening SOB, DOE, wheezing, orthopnea, PND, worsening LE edema, palpitations, dizziness or syncope.  Pt denies neurological change such as new headache, facial or extremity weakness.  Pt denies polydipsia, polyuria, or low sugar symptoms. Pt states overall good compliance with treatment and medications, good tolerability, and has been trying to follow appropriate diet.  Pt denies worsening depressive symptoms, suicidal ideation or panic. No fever, night sweats, wt loss, loss of appetite, or other constitutional symptoms.  Pt states good ability with ADL's, has low fall risk, home safety reviewed and adequate, no other significant changes in hearing or vision, and only occasionally active with exercise. + occas dizzy, feeling off balance.but no further TIA symtpoms.  Has seen lipid clinic , did not qualify for pcsk9., tolerating livalo for now, though has started with some right wrist pain he is concerned about, without trauma, fever or swelling Past Medical History  Diagnosis Date  . Insomnia   . Allergy   . Hyperlipidemia   . History of nephrolithiasis   . BPH (benign prostatic hypertrophy)   . Diverticulosis of colon   . Hx of colonic polyp   . History of shingles   . Libido, decreased 10/04/2011  . Vitiligo 10/31/2012   Past Surgical History  Procedure Laterality Date  . Tonsillectomy    . Lithotrypsy      x 3  . Cystoscopy with ureteroscopy and stent placement Left 07/04/2014    Procedure: CYSTOSCOPY WITH LEFT RETROGRADE PYLEOGRAM, LEFT URETERAL DILATION AND URETEROSCOPY, LASER LITHOTRISPY AND STENT PLACEMENT;  Surgeon: [name], MD;  Location: [facility];  Service: Urology;  Laterality: Left;  . Holmium laser application N/A 81/0/1751    Procedure:  LASER LITHOTRIPSY;  Surgeon: [name], MD;   Location: [facility];  Service: Urology;  Laterality: N/A;    reports that he has quit smoking. He has never used smokeless tobacco. He reports that he drinks about 4.2 oz of alcohol per week. He reports that he does not use illicit drugs. family history includes Colitis in his sister; Heart disease in his other; Hyperlipidemia in his other; Hypertension in his other; Ulcerative colitis in his sister. Allergies  Allergen Reactions  . Atorvastatin     REACTION: myalgia  . Rosuvastatin     REACTION: myalgia  . Simvastatin     REACTION: myalgia  . Adhesive [Tape] Rash   Current Outpatient Prescriptions on File Prior to Visit  Medication Sig Dispense Refill  . aspirin 325 MG tablet Take 325 mg by mouth once as needed for mild pain.    . bisacodyl (DULCOLAX) 5 MG EC tablet Take 5 mg by mouth daily as needed.    . cetirizine (ZYRTEC) 10 MG tablet Take 10 mg by mouth daily as needed for allergies.    . cholecalciferol (VITAMIN D) 1000 UNITS tablet Take 1,000 Units by mouth every morning.    Marland Kitchen co-enzyme Q-10 30 MG capsule Take 100 mg by mouth daily.    Marland Kitchen docusate sodium (COLACE) 100 MG capsule Take 100 mg by mouth daily as needed for mild constipation.    . fluticasone (FLONASE) 50 MCG/ACT nasal spray Place 1 spray into both nostrils daily.    . Melatonin 5 MG TABS Take 1 tablet by mouth at bedtime.     Marland Kitchen  metroNIDAZOLE (METROGEL) 1 % gel Apply 1 application topically daily as needed (rosacea).    . Naphazoline-Pheniramine (OPCON-A OP) Apply 1 drop to eye daily.    Marland Kitchen OVER THE COUNTER MEDICATION Trace mineral supplement daily- 20 drops    . Pitavastatin Calcium 2 MG TABS Take 1 tablet (2 mg total) by mouth daily. 28 tablet 0  . psyllium (METAMUCIL) 58.6 % powder Take 1 packet by mouth daily.     No current facility-administered medications on file prior to visit.   Review of Systems  Constitutional: Negative for increased diaphoresis, other activity, appetite or siginficant weight change other  than noted HENT: Negative for worsening hearing loss, ear pain, facial swelling, mouth sores and neck stiffness.   Eyes: Negative for other worsening pain, redness or visual disturbance.  Respiratory: Negative for shortness of breath and wheezing  Cardiovascular: Negative for chest pain and palpitations.  Gastrointestinal: Negative for diarrhea, blood in stool, abdominal distention or other pain Genitourinary: Negative for hematuria, flank pain or change in urine volume.  Musculoskeletal: Negative for myalgias or other joint complaints.  Skin: Negative for color change and wound or drainage.  Neurological: Negative for syncope and numbness. other than noted Hematological: Negative for adenopathy. or other swelling Psychiatric/Behavioral: Negative for hallucinations, SI, self-injury, decreased concentration or other worsening agitation.      Objective:   Physical Exam BP 124/70 mmHg  Pulse 45  Temp(Src) 97.9 F (36.6 C) (Oral)  Resp 18  Ht 5\' 7"  (1.702 m)  Wt 158 lb (71.668 kg)  BMI 24.74 kg/m2  SpO2 98% VS noted,  Constitutional: Pt is oriented to person, place, and time. Appears well-developed and well-nourished, in no significant distress Head: Normocephalic and atraumatic.  Right Ear: External ear normal.  Left Ear: External ear normal.  Nose: Nose normal.  Mouth/Throat: Oropharynx is clear and moist.  Eyes: Conjunctivae and EOM are normal. Pupils are equal, round, and reactive to light.  Neck: Normal range of motion. Neck supple. No JVD present. No tracheal deviation present or significant neck LA or mass Cardiovascular: Normal rate, regular rhythm, normal heart sounds and intact distal pulses.   Pulmonary/Chest: Effort normal and breath sounds without rales or wheezing  Abdominal: Soft. Bowel sounds are normal. NT. No HSM  Musculoskeletal: Normal range of motion. Exhibits no edema.  Lymphadenopathy:  Has no cervical adenopathy.  Neurological: Pt is alert and oriented to  person, place, and time. Pt has normal reflexes. No cranial nerve deficit. Motor grossly intact Skin: Skin is warm and dry. No rash noted.  Psychiatric:  Has normal mood and affect. Behavior is normal.      Assessment & Plan:

## 2014-12-25 NOTE — Assessment & Plan Note (Signed)
Ok to cont the [employer]

## 2014-12-30 ENCOUNTER — Other Ambulatory Visit: Payer: Self-pay | Admitting: *Deleted

## 2014-12-30 NOTE — Telephone Encounter (Signed)
Received fax from [facility] pt needing PA on his Livalo. Completed form fax back to [facility]...Johny Chess

## 2014-12-30 NOTE — Telephone Encounter (Signed)
Received PA back med has been approved for 10 years through 12/29/24- PA case ID # [id number]...Johny Chess

## 2014-12-31 ENCOUNTER — Encounter: Payer: Self-pay | Admitting: Neurology

## 2014-12-31 ENCOUNTER — Ambulatory Visit (INDEPENDENT_AMBULATORY_CARE_PROVIDER_SITE_OTHER): Payer: 59 | Admitting: Neurology

## 2014-12-31 VITALS — BP 122/64 | HR 72 | Temp 97.5°F | Resp 18 | Ht 67.0 in | Wt 158.0 lb

## 2014-12-31 DIAGNOSIS — G459 Transient cerebral ischemic attack, unspecified: Secondary | ICD-10-CM | POA: Diagnosis not present

## 2014-12-31 DIAGNOSIS — M791 Myalgia, unspecified site: Secondary | ICD-10-CM

## 2014-12-31 DIAGNOSIS — E785 Hyperlipidemia, unspecified: Secondary | ICD-10-CM

## 2014-12-31 NOTE — Progress Notes (Signed)
NEUROLOGY CONSULTATION NOTE  Jerry Rios MRN: [id number] DOB: [birth year]  Referring provider: Dr. [name] Primary care provider: Dr. [name]  Reason for consult:  TIA  HISTORY OF PRESENT ILLNESS: Jerry Rios is a 64 year old right-handed man with hyperlipidemia, BPH and history of nephrolithiasis who presents for TIA.  Records, MRI of brain, reports of echo and carotid doppler and labs reviewed.  On 11/14/14, he developed sudden onset dizziness and right leg numbness.  He denied weakness.  He also felt like he was veering towards the right side.  EMS was called to his home.  His BP was 205/100, but then improved.  He has no prior history of hypertension.  The dizziness lasted about 3 hours.  The right leg numbness lasted about 2 weeks and he continued to feel a little off balance for 3 to 4 weeks.  He followed up with his PCP, Dr. [name], who advised him to start ASA 325mg  daily.  He also takes pravastatin 20mg , but only every 3-4 days due to myalgias.  MRI of the brain was performed on 11/19/14, which was unremarkable but did reveal some fluid in the mastoid air cells.  Carotid duplex showed no hemodynamically significant ICA stenosis.  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  Biopsy revealed this to be benign.  2D echo was unremarkable with EF 55-60%.  Labs from 12/23/14 showed Hgb A1c of 5.6, cholesterol 231, HDL 80.10 and LDL 138.     He was evaluated by cardiology last month.  He was advised to stop pravastatin for 7-10 days for myalgias to resolve.  He was given Livalo 2mg  daily instead.  He developed myalgias again on the Livalo.  PAST MEDICAL HISTORY: Past Medical History  Diagnosis Date  . Insomnia   . Allergy   . Hyperlipidemia   . History of nephrolithiasis   . BPH (benign prostatic hypertrophy)   . Diverticulosis of colon   . Hx of colonic polyp   . History of shingles   . Libido, decreased 10/04/2011  . Vitiligo 10/31/2012    PAST  SURGICAL HISTORY: Past Surgical History  Procedure Laterality Date  . Tonsillectomy    . Lithotrypsy      x 3  . Cystoscopy with ureteroscopy and stent placement Left 07/04/2014    Procedure: CYSTOSCOPY WITH LEFT RETROGRADE PYLEOGRAM, LEFT URETERAL DILATION AND URETEROSCOPY, LASER LITHOTRISPY AND STENT PLACEMENT;  Surgeon: [name], MD;  Location: [facility];  Service: Urology;  Laterality: Left;  . Holmium laser application N/A 93/01/7016    Procedure:  LASER LITHOTRIPSY;  Surgeon: [name], MD;  Location: [facility];  Service: Urology;  Laterality: N/A;    MEDICATIONS: Current Outpatient Prescriptions on File Prior to Visit  Medication Sig Dispense Refill  . aspirin 325 MG tablet Take 325 mg by mouth once as needed for mild pain.    . bisacodyl (DULCOLAX) 5 MG EC tablet Take 5 mg by mouth daily as needed.    . cetirizine (ZYRTEC) 10 MG tablet Take 10 mg by mouth daily as needed for allergies.    . cholecalciferol (VITAMIN D) 1000 UNITS tablet Take 1,000 Units by mouth every morning.    Marland Kitchen co-enzyme Q-10 30 MG capsule Take 100 mg by mouth daily.    Marland Kitchen docusate sodium (COLACE) 100 MG capsule Take 100 mg by mouth daily as needed for mild constipation.    . fluticasone (FLONASE) 50 MCG/ACT nasal spray Place 1 spray into  both nostrils daily.    . Melatonin 5 MG TABS Take 1 tablet by mouth at bedtime.     . metroNIDAZOLE (METROGEL) 1 % gel Apply 1 application topically daily as needed (rosacea).    . Naphazoline-Pheniramine (OPCON-A OP) Apply 1 drop to eye daily.    Marland Kitchen OVER THE COUNTER MEDICATION Trace mineral supplement daily- 20 drops    . Pitavastatin Calcium 2 MG TABS Take 1 tablet (2 mg total) by mouth daily. 90 tablet 3  . psyllium (METAMUCIL) 58.6 % powder Take 1 packet by mouth daily.     No current facility-administered medications on file prior to visit.    ALLERGIES: Allergies  Allergen Reactions  . Atorvastatin     REACTION: myalgia  . Rosuvastatin     REACTION:  myalgia  . Simvastatin     REACTION: myalgia  . Adhesive [Tape] Rash    FAMILY HISTORY: Family History  Problem Relation Age of Onset  . Ulcerative colitis Sister   . Colitis Sister   . Heart disease Other     Grandparents  . Hyperlipidemia Other   . Hypertension Other   . Heart failure Mother   . Heart failure Father   . Lupus Paternal Grandfather   . Heart failure Maternal Grandfather     SOCIAL HISTORY: History   Social History  . Marital Status: Single    Spouse Name: N/A  . Number of Children: N/A  . Years of Education: N/A   Occupational History  . IT    Social History Main Topics  . Smoking status: Former Research scientist (life sciences)  . Smokeless tobacco: Never Used     Comment: Regular exercise - No  . Alcohol Use: 4.2 oz/week    7 Cans of beer per week  . Drug Use: No  . Sexual Activity: No   Other Topics Concern  . Not on file   Social History Narrative   Works at [employer], divorce, no biological children    REVIEW OF SYSTEMS: Constitutional: No fevers, chills, or sweats, no generalized fatigue, change in appetite Eyes: No visual changes, double vision, eye pain Ear, nose and throat: No hearing loss, ear pain, nasal congestion, sore throat Cardiovascular: No chest pain, palpitations Respiratory:  No shortness of breath at rest or with exertion, wheezes GastrointestinaI: No nausea, vomiting, diarrhea, abdominal pain, fecal incontinence Genitourinary:  No dysuria, urinary retention or frequency Musculoskeletal:  No neck pain, back pain Integumentary: No rash, pruritus, skin lesions Neurological: as above Psychiatric: No depression, insomnia, anxiety Endocrine: No palpitations, fatigue, diaphoresis, mood swings, change in appetite, change in weight, increased thirst Hematologic/Lymphatic:  No anemia, purpura, petechiae. Allergic/Immunologic: no itchy/runny eyes, nasal congestion, recent allergic reactions, rashes  PHYSICAL EXAM: Filed Vitals:   12/31/14 0814  BP:  122/64  Pulse: 72  Temp: 97.5 F (36.4 C)  Resp: 18   General: No acute distress Head:  Normocephalic/atraumatic Eyes:  fundi unremarkable, without vessel changes, exudates, hemorrhages or papilledema. Neck: supple, no paraspinal tenderness, full range of motion Back: No paraspinal tenderness Heart: regular rate and rhythm Lungs: Clear to auscultation bilaterally. Vascular: No carotid bruits. Neurological Exam: Mental status: alert and oriented to person, place, and time, recent and remote memory intact, fund of knowledge intact, attention and concentration intact, speech fluent and not dysarthric, language intact. Cranial nerves: CN I: not tested CN II: pupils equal, round and reactive to light, visual fields intact, fundi unremarkable, without vessel changes, exudates, hemorrhages or papilledema. CN III, IV, VI:  full  range of motion, no nystagmus, no ptosis CN V: facial sensation intact CN VII: upper and lower face symmetric CN VIII: hearing intact CN IX, X: gag intact, uvula midline CN XI: sternocleidomastoid and trapezius muscles intact CN XII: tongue midline Bulk & Tone: normal, no fasciculations. Motor:  5/5 throughout Sensation:  Temperature and vibration intact Deep Tendon Reflexes:  2+ throughout, toes downgoing Finger to nose testing:  No dysmetria Heel to shin:  No dysmetria Gait:  Normal station and stride.  Able to turn and walk in tandem. Romberg negative.  IMPRESSION: Probable TIA, given the elevated blood pressure with focal findings (right leg numbness). Hyperlipidemia Statin-induced myalgia  PLAN: 1.  Continue ASA 325mg  daily for secondary stroke prevention 2.  Hopefully, the statin will be tolerated.  Ideally, LDL should be less than 100.  He is concerned about damage to kidney.  I told him if the myalgias should worsen or he notes weakness, we can check a CK 3.  Heart healthy diet. 4.  Follow up in 6 months.  Thank you for allowing me to take part in  the care of this patient.  Metta Clines, DO  CC:  Cathlean Cower, MD

## 2014-12-31 NOTE — Patient Instructions (Signed)
1.  Continue aspirin 325mg  daily 2.  Continue statin as tolerated.  LDL goal should be less than 100. 3.  Heart healthy diet 4.  Follow up in 6 months.

## 2015-07-02 ENCOUNTER — Ambulatory Visit (INDEPENDENT_AMBULATORY_CARE_PROVIDER_SITE_OTHER): Payer: 59 | Admitting: Neurology

## 2015-07-02 ENCOUNTER — Encounter: Payer: Self-pay | Admitting: Neurology

## 2015-07-02 VITALS — BP 100/70 | HR 66 | Ht 67.0 in | Wt 153.7 lb

## 2015-07-02 DIAGNOSIS — G459 Transient cerebral ischemic attack, unspecified: Secondary | ICD-10-CM

## 2015-07-02 DIAGNOSIS — M791 Myalgia, unspecified site: Secondary | ICD-10-CM

## 2015-07-02 DIAGNOSIS — E785 Hyperlipidemia, unspecified: Secondary | ICD-10-CM | POA: Diagnosis not present

## 2015-07-02 NOTE — Patient Instructions (Addendum)
Continue aspirin daily for secondary stroke prevention Continue statin.  We will check a fasting lipid panel and CPK.  We will contact you with results and further recommendations.  This can then be managed by Dr. [name] Follow up with me as needed.  Per your request we put your orders in to have drawn tomorrow at [place] in the basement

## 2015-07-02 NOTE — Progress Notes (Signed)
NEUROLOGY FOLLOW UP OFFICE NOTE  Jerry Rios [id number]  HISTORY OF PRESENT ILLNESS: Jerry Rios is a 64 year old right-handed man with hyperlipidemia, BPH and history of nephrolithiasis who follows up for TIA.  UPDATE: She is taking ASA 325mg  daily for secondary stroke prevention.  He is also taking pitavastatin 2mg  daily.  He is tolerating it better than other statins but still has some aches.  He also has been eating a healthier diet.  He reports some stiffness in his right posterior thigh, hamstrings.  HISTORY: On 11/14/14, he developed sudden onset dizziness and right leg numbness.  He denied weakness.  He also felt like he was veering towards the right side.  EMS was called to his home.  His BP was 205/100, but then improved.  He has no prior history of hypertension.  The dizziness lasted about 3 hours.  The right leg numbness lasted about 2 weeks and he continued to feel a little off balance for 3 to 4 weeks.  He followed up with his PCP, Dr. [name], who advised him to start ASA 325mg  daily.  MRI of the brain was performed on 11/19/14, which was unremarkable but did reveal some fluid in the mastoid air cells.  Carotid duplex showed no hemodynamically significant ICA stenosis.  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  Biopsy revealed this to be benign.  2D echo was unremarkable with EF 55-60%.  Labs from 12/23/14 showed Hgb A1c of 5.6, cholesterol 231, HDL 80.10 and LDL 138.     He was evaluated by cardiology last month.  Due to myalgias, pravastatin was switched to pitavastatin, however he still had myalgias.  PAST MEDICAL HISTORY: Past Medical History  Diagnosis Date  . Insomnia   . Allergy   . Hyperlipidemia   . History of nephrolithiasis   . BPH (benign prostatic hypertrophy)   . Diverticulosis of colon   . Hx of colonic polyp   . History of shingles   . Libido, decreased 10/04/2011  . Vitiligo 10/31/2012    MEDICATIONS: Current  Outpatient Prescriptions on File Prior to Visit  Medication Sig Dispense Refill  . bisacodyl (DULCOLAX) 5 MG EC tablet Take 5 mg by mouth daily as needed.    . cetirizine (ZYRTEC) 10 MG tablet Take 10 mg by mouth daily as needed for allergies.    . cholecalciferol (VITAMIN D) 1000 UNITS tablet Take 1,000 Units by mouth every morning.    Marland Kitchen co-enzyme Q-10 30 MG capsule Take 100 mg by mouth daily.    Marland Kitchen docusate sodium (COLACE) 100 MG capsule Take 100 mg by mouth daily as needed for mild constipation.    . fluticasone (FLONASE) 50 MCG/ACT nasal spray Place 1 spray into both nostrils daily.    . Melatonin 5 MG TABS Take 1 tablet by mouth at bedtime.     . metroNIDAZOLE (METROGEL) 1 % gel Apply 1 application topically daily as needed (rosacea).    . Naphazoline-Pheniramine (OPCON-A OP) Apply 1 drop to eye daily.    Marland Kitchen OVER THE COUNTER MEDICATION Trace mineral supplement daily- 20 drops    . pantoprazole (PROTONIX) 40 MG tablet     . Pitavastatin Calcium 2 MG TABS Take 1 tablet (2 mg total) by mouth daily. 90 tablet 3  . pravastatin (PRAVACHOL) 20 MG tablet     . psyllium (METAMUCIL) 58.6 % powder Take 1 packet by mouth daily.     No current facility-administered medications on file  prior to visit.    ALLERGIES: Allergies  Allergen Reactions  . Atorvastatin     REACTION: myalgia  . Rosuvastatin     REACTION: myalgia  . Simvastatin     REACTION: myalgia  . Adhesive [Tape] Rash    FAMILY HISTORY: Family History  Problem Relation Age of Onset  . Ulcerative colitis Sister   . Colitis Sister   . Heart disease Other     Grandparents  . Hyperlipidemia Other   . Hypertension Other   . Heart failure Mother   . Heart failure Father   . Lupus Paternal Grandfather   . Heart failure Maternal Grandfather     SOCIAL HISTORY: Social History   Social History  . Marital Status: Single    Spouse Name: N/A  . Number of Children: N/A  . Years of Education: N/A   Occupational History  . IT     Social History Main Topics  . Smoking status: Former Research scientist (life sciences)  . Smokeless tobacco: Never Used     Comment: Regular exercise - No  . Alcohol Use: 4.2 oz/week    7 Cans of beer per week  . Drug Use: No  . Sexual Activity: No   Other Topics Concern  . Not on file   Social History Narrative   Works at [employer], divorce, no biological children    REVIEW OF SYSTEMS: Constitutional: No fevers, chills, or sweats, no generalized fatigue, change in appetite Eyes: No visual changes, double vision, eye pain Ear, nose and throat: No hearing loss, ear pain, nasal congestion, sore throat Cardiovascular: No chest pain, palpitations Respiratory:  No shortness of breath at rest or with exertion, wheezes GastrointestinaI: No nausea, vomiting, diarrhea, abdominal pain, fecal incontinence Genitourinary:  No dysuria, urinary retention or frequency Musculoskeletal:  No neck pain, back pain Integumentary: No rash, pruritus, skin lesions Neurological: as above Psychiatric: No depression, insomnia, anxiety Endocrine: No palpitations, fatigue, diaphoresis, mood swings, change in appetite, change in weight, increased thirst Hematologic/Lymphatic:  No anemia, purpura, petechiae. Allergic/Immunologic: no itchy/runny eyes, nasal congestion, recent allergic reactions, rashes  PHYSICAL EXAM: Filed Vitals:   07/02/15 0748  BP: 100/70  Pulse: 66   General: No acute distress.  Patient appears well-groomed.   Head:  Normocephalic/atraumatic Eyes:  Fundoscopic exam unremarkable without vessel changes, exudates, hemorrhages or papilledema. Neck: supple, no paraspinal tenderness, full range of motion Heart:  Regular rate and rhythm Lungs:  Clear to auscultation bilaterally Back: No paraspinal tenderness Neurological Exam: alert and oriented to person, place, and time. Attention span and concentration intact, recent and remote memory intact, fund of knowledge intact.  Speech fluent and not dysarthric,  language intact.  CN II-XII intact. Fundoscopic exam unremarkable without vessel changes, exudates, hemorrhages or papilledema.  Bulk and tone normal, muscle strength 5/5 throughout.  Sensation to light touch, temperature and vibration intact.  Deep tendon reflexes 2+ throughout, toes downgoing.  Finger to nose and heel to shin testing intact.  Gait normal, Romberg negative.  IMPRESSION: Probable TIA, given the elevated blood pressure with focal findings (right leg numbness). Hyperlipidemia Statin-induced myalgia  PLAN: 1.  Continue ASA 325mg  daily for secondary stroke prevention 2.  Continue pitavastatin 2mg  daily.  Will check another fasting lipid panel and make further adjustments as needed.  His LDL goal should be less than 100. 3.  Due to his concern of myalgias, will check CPK 4.  Mediterranean diet. 5.  Will call with test results and further recommendations/adjustments.  However, management of hyperlipidemia  may be covered by his PCP.  He can follow up with me as needed.  15 minutes spent face to face with patient, over 50% spent discussing management.  Metta Clines, DO  CC:  Cathlean Cower, MD

## 2015-07-03 ENCOUNTER — Other Ambulatory Visit: Payer: Self-pay | Admitting: *Deleted

## 2015-07-03 ENCOUNTER — Other Ambulatory Visit: Payer: 59

## 2015-07-03 DIAGNOSIS — G458 Other transient cerebral ischemic attacks and related syndromes: Secondary | ICD-10-CM

## 2015-07-03 DIAGNOSIS — G459 Transient cerebral ischemic attack, unspecified: Secondary | ICD-10-CM

## 2015-07-03 DIAGNOSIS — M791 Myalgia, unspecified site: Secondary | ICD-10-CM

## 2015-07-03 DIAGNOSIS — E785 Hyperlipidemia, unspecified: Secondary | ICD-10-CM

## 2015-07-03 LAB — LIPID PANEL
CHOL/HDL RATIO: 3
Cholesterol: 213 mg/dL — ABNORMAL HIGH (ref 0–200)
HDL: 83.3 mg/dL (ref >39.00)
LDL CALC: 117 mg/dL — AB (ref 0–99)
NONHDL: 129.27
Triglycerides: 62 mg/dL (ref 0.0–149.0)
VLDL: 12.4 mg/dL (ref 0.0–40.0)

## 2015-07-03 LAB — CK: Total CK: 90 U/L (ref 7–232)

## 2015-07-04 ENCOUNTER — Telehealth: Payer: Self-pay | Admitting: Internal Medicine

## 2015-07-04 MED ORDER — PRAVASTATIN SODIUM 40 MG PO TABS: 40.0000 mg | ORAL_TABLET | Freq: Every day | ORAL | Status: DC

## 2015-07-04 NOTE — Telephone Encounter (Signed)
-----   Message from Pieter Partridge, DO sent at 07/03/2015 11:38 AM EDT ----- CK is normal.  His cholesterol is still too elevated (LDL is 117 and I would like it less than 100).  I suggest he discuss with Dr. [name] about increasing his pitavastatin.  I will send this message to Dr. [name] as well.

## 2015-07-04 NOTE — Telephone Encounter (Signed)
Ok to increase the pravastatin to 40 qd due to last LDL mildly high  Jerry Rios to let pt know

## 2015-07-06 NOTE — Telephone Encounter (Signed)
Left message on machine for pt to return my call  

## 2015-07-07 NOTE — Telephone Encounter (Signed)
Left message on machine for pt to return my call  

## 2015-07-08 NOTE — Telephone Encounter (Signed)
Pt declines to take Pravastatin, states he has been take Livalo with "good results' and would prefer to discuss further with Dr [name] at next [place]

## 2015-07-08 NOTE — Telephone Encounter (Signed)
Left message on machine for pt to return my call  

## 2015-07-14 ENCOUNTER — Other Ambulatory Visit: Payer: Self-pay | Admitting: Internal Medicine

## 2015-09-04 ENCOUNTER — Other Ambulatory Visit: Payer: Self-pay | Admitting: Pharmacist

## 2015-09-04 MED ORDER — PITAVASTATIN CALCIUM 4 MG PO TABS: 4.0000 mg | ORAL_TABLET | Freq: Every day | ORAL | Status: DC

## 2015-12-08 ENCOUNTER — Encounter: Payer: Self-pay | Admitting: Internal Medicine

## 2015-12-08 ENCOUNTER — Ambulatory Visit (INDEPENDENT_AMBULATORY_CARE_PROVIDER_SITE_OTHER): Payer: 59 | Admitting: Internal Medicine

## 2015-12-08 VITALS — BP 116/64 | HR 58 | Temp 97.9°F | Resp 20 | Wt 158.0 lb

## 2015-12-08 DIAGNOSIS — F329 Major depressive disorder, single episode, unspecified: Secondary | ICD-10-CM | POA: Diagnosis not present

## 2015-12-08 DIAGNOSIS — Z0000 Encounter for general adult medical examination without abnormal findings: Secondary | ICD-10-CM | POA: Diagnosis not present

## 2015-12-08 DIAGNOSIS — E785 Hyperlipidemia, unspecified: Secondary | ICD-10-CM | POA: Diagnosis not present

## 2015-12-08 DIAGNOSIS — J309 Allergic rhinitis, unspecified: Secondary | ICD-10-CM | POA: Diagnosis not present

## 2015-12-08 DIAGNOSIS — G459 Transient cerebral ischemic attack, unspecified: Secondary | ICD-10-CM

## 2015-12-08 DIAGNOSIS — F32A Depression, unspecified: Secondary | ICD-10-CM

## 2015-12-08 NOTE — Progress Notes (Signed)
Pre visit review using our clinic review tool, if applicable. No additional management support is needed unless otherwise documented below in the visit note. 

## 2015-12-08 NOTE — Assessment & Plan Note (Signed)
Mild with seasonal flare, for re-start flonase, antihist asd prn,  to f/u any worsening symptoms or concerns

## 2015-12-08 NOTE — Assessment & Plan Note (Signed)
stable overall by history and exam, recent data reviewed with pt, and pt to continue medical treatment as before,  to f/u any worsening symptoms or concerns Lab Results  Component Value Date   HGBA1C 5.6 12/23/2014   BP Readings from Last 3 Encounters:  12/08/15 116/64  07/02/15 100/70  12/31/14 122/64   Lab Results  Component Value Date   LDLCALC 117* 07/03/2015   For fu labs, cont risk factor modification, wt control

## 2015-12-08 NOTE — Progress Notes (Addendum)
Subjective:    Patient ID: Jerry Rios, male    DOB: 11-22-1950, 65 y.o.   MRN: [id number]  HPI  Here for wellness and f/u;  Overall doing ok;  Pt denies Chest pain, worsening SOB, DOE, wheezing, orthopnea, PND, worsening LE edema, palpitations, dizziness or syncope.  Pt denies neurological change such as new headache, facial or extremity weakness.  Pt denies polydipsia, polyuria, or low sugar symptoms. Pt states overall good compliance with treatment and medications, good tolerability, and has been trying to follow appropriate diet.  Pt denies worsening depressive symptoms, suicidal ideation or panic. No fever, night sweats, wt loss, loss of appetite, or other constitutional symptoms.  Pt states good ability with ADL's, has low fall risk, home safety reviewed and adequate, no other significant changes in hearing or vision, and only occasionally active with exercise.  Does have several wks ongoing nasal allergy symptoms with clearish congestion, itch and sneezing, without fever, pain, ST, cough, swelling or wheezing.  Denies worsening depressive symptoms, suicidal ideation, or panic Wt Readings from Last 3 Encounters:  12/08/15 158 lb (71.668 kg)  07/02/15 153 lb 11.2 oz (69.718 kg)  12/31/14 158 lb (71.668 kg)   BP Readings from Last 3 Encounters:  12/08/15 116/64  07/02/15 100/70  12/31/14 122/64   Past Medical History  Diagnosis Date  . Insomnia   . Allergy   . Hyperlipidemia   . History of nephrolithiasis   . BPH (benign prostatic hypertrophy)   . Diverticulosis of colon   . Hx of colonic polyp   . History of shingles   . Libido, decreased 10/04/2011  . Vitiligo 10/31/2012   Past Surgical History  Procedure Laterality Date  . Tonsillectomy    . Lithotrypsy      x 3  . Cystoscopy with ureteroscopy and stent placement Left 07/04/2014    Procedure: CYSTOSCOPY WITH LEFT RETROGRADE PYLEOGRAM, LEFT URETERAL DILATION AND URETEROSCOPY, LASER LITHOTRISPY AND STENT PLACEMENT;   Surgeon: [name], MD;  Location: [facility];  Service: Urology;  Laterality: Left;  . Holmium laser application N/A [id number]    Procedure:  LASER LITHOTRIPSY;  Surgeon: [name], MD;  Location: [facility];  Service: Urology;  Laterality: N/A;    reports that he has quit smoking. He has never used smokeless tobacco. He reports that he drinks about 4.2 oz of alcohol per week. He reports that he does not use illicit drugs. family history includes Colitis in his sister; Heart disease in his other; Heart failure in his father, maternal grandfather, and mother; Hyperlipidemia in his other; Hypertension in his other; Lupus in his paternal grandfather; Ulcerative colitis in his sister. Allergies  Allergen Reactions  . Atorvastatin     REACTION: myalgia  . Rosuvastatin     REACTION: myalgia  . Simvastatin     REACTION: myalgia  . Adhesive [Tape] Rash   Current Outpatient Prescriptions on File Prior to Visit  Medication Sig Dispense Refill  . aspirin 81 MG tablet Take 81 mg by mouth daily.    . bisacodyl (DULCOLAX) 5 MG EC tablet Take 5 mg by mouth daily as needed.    . cetirizine (ZYRTEC) 10 MG tablet Take 10 mg by mouth daily as needed for allergies.    . cholecalciferol (VITAMIN D) 1000 UNITS tablet Take 1,000 Units by mouth every morning.    Marland Kitchen co-enzyme Q-10 30 MG capsule Take 100 mg by mouth daily.    Marland Kitchen docusate sodium (COLACE) 100 MG capsule Take  100 mg by mouth daily as needed for mild constipation.    . fluticasone (FLONASE) 50 MCG/ACT nasal spray Place 2 sprays into both nostrils daily. 48 g 5  . Melatonin 5 MG TABS Take 1 tablet by mouth at bedtime.     . metroNIDAZOLE (METROGEL) 1 % gel Apply 1 application topically daily as needed (rosacea).    . Naphazoline-Pheniramine (OPCON-A OP) Apply 1 drop to eye daily.    Marland Kitchen OVER THE COUNTER MEDICATION Trace mineral supplement daily- 20 drops    . Pitavastatin Calcium 4 MG TABS Take 1 tablet (4 mg total) by mouth daily. 30 tablet 11   . psyllium (METAMUCIL) 58.6 % powder Take 1 packet by mouth daily.     No current facility-administered medications on file prior to visit.   Review of Systems Constitutional: Negative for increased diaphoresis, other activity, appetite or siginficant weight change other than noted HENT: Negative for worsening hearing loss, ear pain, facial swelling, mouth sores and neck stiffness.   Eyes: Negative for other worsening pain, redness or visual disturbance.  Respiratory: Negative for shortness of breath and wheezing  Cardiovascular: Negative for chest pain and palpitations.  Gastrointestinal: Negative for diarrhea, blood in stool, abdominal distention or other pain Genitourinary: Negative for hematuria, flank pain or change in urine volume.  Musculoskeletal: Negative for myalgias or other joint complaints.  Skin: Negative for color change and wound or drainage.  Neurological: Negative for syncope and numbness. other than noted Hematological: Negative for adenopathy. or other swelling Psychiatric/Behavioral: Negative for hallucinations, SI, self-injury, decreased concentration or other worsening agitation.     Objective:   Physical Exam BP 116/64 mmHg  Pulse 58  Temp(Src) 97.9 F (36.6 C) (Oral)  Resp 20  Wt 158 lb (71.668 kg)  SpO2 97% VS noted,  Constitutional: Pt is oriented to person, place, and time. Appears well-developed and well-nourished, in no significant distress Head: Normocephalic and atraumatic.  Right Ear: External ear normal.  Left Ear: External ear normal.  Nose: Nose normal.  Mouth/Throat: Oropharynx is clear and moist.  Eyes: Conjunctivae and EOM are normal. Pupils are equal, round, and reactive to light.  Neck: Normal range of motion. Neck supple. No JVD present. No tracheal deviation present or significant neck LA or mass Cardiovascular: Normal rate, regular rhythm, normal heart sounds and intact distal pulses.   Pulmonary/Chest: Effort normal and breath sounds  without rales or wheezing  Abdominal: Soft. Bowel sounds are normal. NT. No HSM  Musculoskeletal: Normal range of motion. Exhibits no edema.  Lymphadenopathy:  Has no cervical adenopathy.  Neurological: Pt is alert and oriented to person, place, and time. Pt has normal reflexes. No cranial nerve deficit. Motor grossly intact Skin: Skin is warm and dry. No rash noted.  Psychiatric:  Has normal mood and affect. Behavior is normal. not depressed affect    Assessment & Plan:

## 2015-12-08 NOTE — Assessment & Plan Note (Addendum)

## 2015-12-08 NOTE — Patient Instructions (Signed)

## 2015-12-08 NOTE — Assessment & Plan Note (Signed)
stable overall by history and exam, recent data reviewed with pt, and pt to continue medical treatment as before,  to f/u any worsening symptoms or concerns Lab Results  Component Value Date   WBC 5.1 12/23/2014   HGB 14.4 12/23/2014   HCT 41.6 12/23/2014   PLT 215.0 12/23/2014   GLUCOSE 93 12/23/2014   CHOL 213* 07/03/2015   TRIG 62.0 07/03/2015   HDL 83.30 07/03/2015   LDLDIRECT 206.1 11/01/2013   LDLCALC 117* 07/03/2015   ALT 17 12/23/2014   AST 24 12/23/2014   NA 140 12/23/2014   K 3.8 12/23/2014   CL 103 12/23/2014   CREATININE 1.27 12/23/2014   BUN 15 12/23/2014   CO2 31 12/23/2014   TSH 2.36 12/23/2014   PSA 1.23 12/23/2014   HGBA1C 5.6 12/23/2014

## 2015-12-08 NOTE — Assessment & Plan Note (Signed)
Improved, o/s stable overall by history and exam, recent data reviewed with pt, and pt to continue medical treatment as before,  to f/u any worsening symptoms or concerns Goal ldl < 70, for f/u lab

## 2015-12-11 ENCOUNTER — Other Ambulatory Visit (INDEPENDENT_AMBULATORY_CARE_PROVIDER_SITE_OTHER): Payer: 59

## 2015-12-11 DIAGNOSIS — Z0000 Encounter for general adult medical examination without abnormal findings: Secondary | ICD-10-CM

## 2015-12-11 LAB — BASIC METABOLIC PANEL
BUN: 14 mg/dL (ref 6–23)
CHLORIDE: 103 meq/L (ref 96–112)
CO2: 32 mEq/L (ref 19–32)
Calcium: 9.7 mg/dL (ref 8.4–10.5)
Creatinine, Ser: 1.28 mg/dL (ref 0.40–1.50)
GFR: 59.89 mL/min — ABNORMAL LOW (ref >60.00)
Glucose, Bld: 99 mg/dL (ref 70–99)
POTASSIUM: 4.1 meq/L (ref 3.5–5.1)
Sodium: 141 mEq/L (ref 135–145)

## 2015-12-11 LAB — CBC WITH DIFFERENTIAL/PLATELET
Basophils Absolute: 0 10*3/uL (ref 0.0–0.1)
Basophils Relative: 0.5 % (ref 0.0–3.0)
EOS PCT: 7 % — AB (ref 0.0–5.0)
Eosinophils Absolute: 0.4 10*3/uL (ref 0.0–0.7)
HEMATOCRIT: 41.4 % (ref 39.0–52.0)
HEMOGLOBIN: 14.3 g/dL (ref 13.0–17.0)
LYMPHS PCT: 26.9 % (ref 12.0–46.0)
Lymphs Abs: 1.6 10*3/uL (ref 0.7–4.0)
MCHC: 34.5 g/dL (ref 30.0–36.0)
MCV: 90.8 fl (ref 78.0–100.0)
MONO ABS: 0.6 10*3/uL (ref 0.1–1.0)
MONOS PCT: 9.4 % (ref 3.0–12.0)
Neutro Abs: 3.4 10*3/uL (ref 1.4–7.7)
Neutrophils Relative %: 56.2 % (ref 43.0–77.0)
Platelets: 223 10*3/uL (ref 150.0–400.0)
RBC: 4.55 Mil/uL (ref 4.22–5.81)
RDW: 13.8 % (ref 11.5–15.5)
WBC: 6 10*3/uL (ref 4.0–10.5)

## 2015-12-11 LAB — URINALYSIS, ROUTINE W REFLEX MICROSCOPIC
BILIRUBIN URINE: NEGATIVE
KETONES UR: NEGATIVE
LEUKOCYTES UA: NEGATIVE
NITRITE: NEGATIVE
PH: 6 (ref 5.0–8.0)
Specific Gravity, Urine: 1.01 (ref 1.000–1.030)
TOTAL PROTEIN, URINE-UPE24: NEGATIVE
Urine Glucose: NEGATIVE
Urobilinogen, UA: 0.2 (ref 0.0–1.0)

## 2015-12-11 LAB — HEPATIC FUNCTION PANEL
ALK PHOS: 32 U/L — AB (ref 39–117)
ALT: 19 U/L (ref 0–53)
AST: 22 U/L (ref 0–37)
Albumin: 4.3 g/dL (ref 3.5–5.2)
BILIRUBIN DIRECT: 0.1 mg/dL (ref 0.0–0.3)
BILIRUBIN TOTAL: 0.6 mg/dL (ref 0.2–1.2)
Total Protein: 6.7 g/dL (ref 6.0–8.3)

## 2015-12-11 LAB — TSH: TSH: 1.94 u[IU]/mL (ref 0.35–4.50)

## 2015-12-11 LAB — LIPID PANEL
CHOL/HDL RATIO: 2
Cholesterol: 215 mg/dL — ABNORMAL HIGH (ref 0–200)
HDL: 90.7 mg/dL (ref >39.00)
LDL Cholesterol: 114 mg/dL — ABNORMAL HIGH (ref 0–99)
NONHDL: 124.59
Triglycerides: 51 mg/dL (ref 0.0–149.0)
VLDL: 10.2 mg/dL (ref 0.0–40.0)

## 2015-12-11 LAB — PSA: PSA: 2.08 ng/mL (ref 0.10–4.00)

## 2015-12-12 LAB — HEPATITIS C ANTIBODY: HCV AB: NEGATIVE

## 2016-02-28 ENCOUNTER — Encounter: Payer: Self-pay | Admitting: Internal Medicine

## 2016-03-03 ENCOUNTER — Ambulatory Visit (INDEPENDENT_AMBULATORY_CARE_PROVIDER_SITE_OTHER): Payer: 59 | Admitting: Family

## 2016-03-03 VITALS — BP 110/64 | HR 55 | Temp 97.5°F | Ht 67.0 in | Wt 159.2 lb

## 2016-03-03 DIAGNOSIS — M25512 Pain in left shoulder: Secondary | ICD-10-CM

## 2016-03-03 DIAGNOSIS — M79604 Pain in right leg: Secondary | ICD-10-CM | POA: Diagnosis not present

## 2016-03-03 MED ORDER — DICLOFENAC SODIUM 1 % TD GEL: 4.0000 g | Freq: Four times a day (QID) | TRANSDERMAL | Status: DC

## 2016-03-03 NOTE — Patient Instructions (Signed)
Suspect frozen shoulder and low back pain with sciatica to right posterior thigh.   As we discussed, we will treat conservatively for next couple weeks and if does not improve we will do MRI and refer you to sports medicine.  If there is no improvement in your symptoms, or if there is any worsening of symptoms, or if you have any additional concerns, please return for re-evaluation; or, if we are closed, consider going to the Emergency Room for evaluation if symptoms urgent.   Adhesive Capsulitis Adhesive capsulitis is inflammation of the tendons and ligaments that surround the shoulder joint (shoulder capsule). This condition causes the shoulder to become stiff and painful to move. Adhesive capsulitis is also called frozen shoulder. CAUSES This condition may be caused by:  An injury to the shoulder joint.  Straining the shoulder.  Not moving the shoulder for a period of time. This can happen if your arm was injured or in a sling.  Long-standing health problems, such as:  Diabetes.  Thyroid problems.  Heart disease.  Stroke.  Rheumatoid arthritis.  Lung disease. In some cases, the cause may not be known. RISK FACTORS This condition is more likely to develop in:  Women.  People who are older than 66 years of age. SYMPTOMS Symptoms of this condition include:  Pain in the shoulder when moving the arm. There may also be pain when parts of the shoulder are touched. The pain is worse at night or when at rest.  Soreness or aching in the shoulder.  Inability to move the shoulder normally.  Muscle spasms. DIAGNOSIS This condition is diagnosed with a physical exam and imaging tests, such as an X-ray or MRI. TREATMENT This condition may be treated with:  Treatment of the underlying cause or condition.  Physical therapy. This involves performing exercises to get the shoulder moving again.  Medicine. Medicine may be given to relieve pain, inflammation, or muscle  spasms.  Steroid injections into the shoulder joint.  Shoulder manipulation. This is a procedure to move the shoulder into another position. It is done after you are given a medicine to make you fall asleep (general anesthetic). The joint may also be injected with salt water at high pressure to break down scarring.  Surgery. This may be done in severe cases when other treatments have failed. Although most people recover completely from adhesive capsulitis, some may not regain the full movement of the shoulder. HOME CARE INSTRUCTIONS  Take over-the-counter and prescription medicines only as told by your health care provider.  If you are being treated with physical therapy, follow instructions from your physical therapist.  Avoid exercises that put a lot of demand on your shoulder, such as throwing. These exercises can make pain worse.  If directed, apply ice to the injured area:  Put ice in a plastic bag.  Place a towel between your skin and the bag.  Leave the ice on for 20 minutes, 2-3 times per day. SEEK MEDICAL CARE IF:  You develop new symptoms.  Your symptoms get worse.   This information is not intended to replace advice given to you by your health care provider. Make sure you discuss any questions you have with your health care provider.   Document Released: 07/10/2009 Document Revised: 06/03/2015 Document Reviewed: 01/05/2015 Elsevier Interactive Patient Education 2016 [place] The sciatic nerve runs from the back down the leg and is responsible for sensation and control of the muscles in the back (posterior) side of the  thigh, lower leg, and foot. Sciatica is a condition that is characterized by inflammation of this nerve.  SYMPTOMS   Signs of nerve damage, including numbness and/or weakness along the posterior side of the lower extremity.  Pain in the back of the thigh that may also travel down the leg.  Pain that worsens when sitting for  long periods of time.  Occasionally, pain in the back or buttock. CAUSES  Inflammation of the sciatic nerve is the cause of sciatica. The inflammation is due to something irritating the nerve. Common sources of irritation include:  Sitting for long periods of time.  Direct trauma to the nerve.  Arthritis of the spine.  Herniated or ruptured disk.  Slipping of the vertebrae (spondylolisthesis).  Pressure from soft tissues, such as muscles or ligament-like tissue (fascia). RISK INCREASES WITH:  Sports that place pressure or stress on the spine (football or weightlifting).  Poor strength and flexibility.  Failure to warm up properly before activity.  Family history of low back pain or disk disorders.  Previous back injury or surgery.  Poor body mechanics, especially when lifting, or poor posture. PREVENTION   Warm up and stretch properly before activity.  Maintain physical fitness:  Strength, flexibility, and endurance.  Cardiovascular fitness.  Learn and use proper technique, especially with posture and lifting. When possible, have coach correct improper technique.  Avoid activities that place stress on the spine. PROGNOSIS If treated properly, then sciatica usually resolves within 6 weeks. However, occasionally surgery is necessary.  RELATED COMPLICATIONS   Permanent nerve damage, including pain, numbness, tingle, or weakness.  Chronic back pain.  Risks of surgery: infection, bleeding, nerve damage, or damage to surrounding tissues. TREATMENT Treatment initially involves resting from any activities that aggravate your symptoms. The use of ice and medication may help reduce pain and inflammation. The use of strengthening and stretching exercises may help reduce pain with activity. These exercises may be performed at home or with referral to a therapist. A therapist may recommend further treatments, such as transcutaneous electronic nerve stimulation (TENS) or  ultrasound. Your caregiver may recommend corticosteroid injections to help reduce inflammation of the sciatic nerve. If symptoms persist despite non-surgical (conservative) treatment, then surgery may be recommended. MEDICATION  If pain medication is necessary, then nonsteroidal anti-inflammatory medications, such as aspirin and ibuprofen, or other minor pain relievers, such as acetaminophen, are often recommended.  Do not take pain medication for 7 days before surgery.  Prescription pain relievers may be given if deemed necessary by your caregiver. Use only as directed and only as much as you need.  Ointments applied to the skin may be helpful.  Corticosteroid injections may be given by your caregiver. These injections should be reserved for the most serious cases, because they may only be given a certain number of times. HEAT AND COLD  Cold treatment (icing) relieves pain and reduces inflammation. Cold treatment should be applied for 10 to 15 minutes every 2 to 3 hours for inflammation and pain and immediately after any activity that aggravates your symptoms. Use ice packs or massage the area with a piece of ice (ice massage).  Heat treatment may be used prior to performing the stretching and strengthening activities prescribed by your caregiver, physical therapist, or athletic trainer. Use a heat pack or soak the injury in warm water. SEEK MEDICAL CARE IF:  Treatment seems to offer no benefit, or the condition worsens.  Any medications produce adverse side effects. EXERCISES  RANGE OF MOTION (ROM) AND  STRETCHING EXERCISES - Sciatica Most people with sciatic will find that their symptoms worsen with either excessive bending forward (flexion) or arching at the low back (extension). The exercises which will help resolve your symptoms will focus on the opposite motion. Your physician, physical therapist or athletic trainer will help you determine which exercises will be most helpful to resolve  your low back pain. Do not complete any exercises without first consulting with your clinician. Discontinue any exercises which worsen your symptoms until you speak to your clinician. If you have pain, numbness or tingling which travels down into your buttocks, leg or foot, the goal of the therapy is for these symptoms to move closer to your back and eventually resolve. Occasionally, these leg symptoms will get better, but your low back pain may worsen; this is typically an indication of progress in your rehabilitation. Be certain to be very alert to any changes in your symptoms and the activities in which you participated in the 24 hours prior to the change. Sharing this information with your clinician will allow him/her to most efficiently treat your condition. These exercises may help you when beginning to rehabilitate your injury. Your symptoms may resolve with or without further involvement from your physician, physical therapist or athletic trainer. While completing these exercises, remember:   Restoring tissue flexibility helps normal motion to return to the joints. This allows healthier, less painful movement and activity.  An effective stretch should be held for at least 30 seconds.  A stretch should never be painful. You should only feel a gentle lengthening or release in the stretched tissue. FLEXION RANGE OF MOTION AND STRETCHING EXERCISES: STRETCH - Flexion, Single Knee to Chest   Lie on a firm bed or floor with both legs extended in front of you.  Keeping one leg in contact with the floor, bring your opposite knee to your chest. Hold your leg in place by either grabbing behind your thigh or at your knee.  Pull until you feel a gentle stretch in your low back. Hold __________ seconds.  Slowly release your grasp and repeat the exercise with the opposite side. Repeat __________ times. Complete this exercise __________ times per day.  STRETCH - Flexion, Double Knee to Chest  Lie on a  firm bed or floor with both legs extended in front of you.  Keeping one leg in contact with the floor, bring your opposite knee to your chest.  Tense your stomach muscles to support your back and then lift your other knee to your chest. Hold your legs in place by either grabbing behind your thighs or at your knees.  Pull both knees toward your chest until you feel a gentle stretch in your low back. Hold __________ seconds.  Tense your stomach muscles and slowly return one leg at a time to the floor. Repeat __________ times. Complete this exercise __________ times per day.  STRETCH - Low Trunk Rotation   Lie on a firm bed or floor. Keeping your legs in front of you, bend your knees so they are both pointed toward the ceiling and your feet are flat on the floor.  Extend your arms out to the side. This will stabilize your upper body by keeping your shoulders in contact with the floor.  Gently and slowly drop both knees together to one side until you feel a gentle stretch in your low back. Hold for __________ seconds.  Tense your stomach muscles to support your low back as you bring your knees back  to the starting position. Repeat the exercise to the other side. Repeat __________ times. Complete this exercise __________ times per day  EXTENSION RANGE OF MOTION AND FLEXIBILITY EXERCISES: STRETCH - Extension, Prone on Elbows  Lie on your stomach on the floor, a bed will be too soft. Place your palms about shoulder width apart and at the height of your head.  Place your elbows under your shoulders. If this is too painful, stack pillows under your chest.  Allow your body to relax so that your hips drop lower and make contact more completely with the floor.  Hold this position for __________ seconds.  Slowly return to lying flat on the floor. Repeat __________ times. Complete this exercise __________ times per day.  RANGE OF MOTION - Extension, Prone Press Ups  Lie on your stomach on the  floor, a bed will be too soft. Place your palms about shoulder width apart and at the height of your head.  Keeping your back as relaxed as possible, slowly straighten your elbows while keeping your hips on the floor. You may adjust the placement of your hands to maximize your comfort. As you gain motion, your hands will come more underneath your shoulders.  Hold this position __________ seconds.  Slowly return to lying flat on the floor. Repeat __________ times. Complete this exercise __________ times per day.  STRENGTHENING EXERCISES - Sciatica  These exercises may help you when beginning to rehabilitate your injury. These exercises should be done near your "sweet spot." This is the neutral, low-back arch, somewhere between fully rounded and fully arched, that is your least painful position. When performed in this safe range of motion, these exercises can be used for people who have either a flexion or extension based injury. These exercises may resolve your symptoms with or without further involvement from your physician, physical therapist or athletic trainer. While completing these exercises, remember:   Muscles can gain both the endurance and the strength needed for everyday activities through controlled exercises.  Complete these exercises as instructed by your physician, physical therapist or athletic trainer. Progress with the resistance and repetition exercises only as your caregiver advises.  You may experience muscle soreness or fatigue, but the pain or discomfort you are trying to eliminate should never worsen during these exercises. If this pain does worsen, stop and make certain you are following the directions exactly. If the pain is still present after adjustments, discontinue the exercise until you can discuss the trouble with your clinician. STRENGTHENING - Deep Abdominals, Pelvic Tilt   Lie on a firm bed or floor. Keeping your legs in front of you, bend your knees so they are  both pointed toward the ceiling and your feet are flat on the floor.  Tense your lower abdominal muscles to press your low back into the floor. This motion will rotate your pelvis so that your tail bone is scooping upwards rather than pointing at your feet or into the floor.  With a gentle tension and even breathing, hold this position for __________ seconds. Repeat __________ times. Complete this exercise __________ times per day.  STRENGTHENING - Abdominals, Crunches   Lie on a firm bed or floor. Keeping your legs in front of you, bend your knees so they are both pointed toward the ceiling and your feet are flat on the floor. Cross your arms over your chest.  Slightly tip your chin down without bending your neck.  Tense your abdominals and slowly lift your trunk high enough to  just clear your shoulder blades. Lifting higher can put excessive stress on the low back and does not further strengthen your abdominal muscles.  Control your return to the starting position. Repeat __________ times. Complete this exercise __________ times per day.  STRENGTHENING - Quadruped, Opposite UE/LE Lift  Assume a hands and knees position on a firm surface. Keep your hands under your shoulders and your knees under your hips. You may place padding under your knees for comfort.  Find your neutral spine and gently tense your abdominal muscles so that you can maintain this position. Your shoulders and hips should form a rectangle that is parallel with the floor and is not twisted.  Keeping your trunk steady, lift your right hand no higher than your shoulder and then your left leg no higher than your hip. Make sure you are not holding your breath. Hold this position __________ seconds.  Continuing to keep your abdominal muscles tense and your back steady, slowly return to your starting position. Repeat with the opposite arm and leg. Repeat __________ times. Complete this exercise __________ times per day.   STRENGTHENING - Abdominals and Quadriceps, Straight Leg Raise   Lie on a firm bed or floor with both legs extended in front of you.  Keeping one leg in contact with the floor, bend the other knee so that your foot can rest flat on the floor.  Find your neutral spine, and tense your abdominal muscles to maintain your spinal position throughout the exercise.  Slowly lift your straight leg off the floor about 6 inches for a count of 15, making sure to not hold your breath.  Still keeping your neutral spine, slowly lower your leg all the way to the floor. Repeat this exercise with each leg __________ times. Complete this exercise __________ times per day. POSTURE AND BODY MECHANICS CONSIDERATIONS - Sciatica Keeping correct posture when sitting, standing or completing your activities will reduce the stress put on different body tissues, allowing injured tissues a chance to heal and limiting painful experiences. The following are general guidelines for improved posture. Your physician or physical therapist will provide you with any instructions specific to your needs. While reading these guidelines, remember:  The exercises prescribed by your provider will help you have the flexibility and strength to maintain correct postures.  The correct posture provides the optimal environment for your joints to work. All of your joints have less wear and tear when properly supported by a spine with good posture. This means you will experience a healthier, less painful body.  Correct posture must be practiced with all of your activities, especially prolonged sitting and standing. Correct posture is as important when doing repetitive low-stress activities (typing) as it is when doing a single heavy-load activity (lifting). RESTING POSITIONS Consider which positions are most painful for you when choosing a resting position. If you have pain with flexion-based activities (sitting, bending, stooping, squatting),  choose a position that allows you to rest in a less flexed posture. You would want to avoid curling into a fetal position on your side. If your pain worsens with extension-based activities (prolonged standing, working overhead), avoid resting in an extended position such as sleeping on your stomach. Most people will find more comfort when they rest with their spine in a more neutral position, neither too rounded nor too arched. Lying on a non-sagging bed on your side with a pillow between your knees, or on your back with a pillow under your knees will often provide some  relief. Keep in mind, being in any one position for a prolonged period of time, no matter how correct your posture, can still lead to stiffness. PROPER SITTING POSTURE In order to minimize stress and discomfort on your spine, you must sit with correct posture Sitting with good posture should be effortless for a healthy body. Returning to good posture is a gradual process. Many people can work toward this most comfortably by using various supports until they have the flexibility and strength to maintain this posture on their own. When sitting with proper posture, your ears will fall over your shoulders and your shoulders will fall over your hips. You should use the back of the chair to support your upper back. Your low back will be in a neutral position, just slightly arched. You may place a small pillow or folded towel at the base of your low back for support.  When working at a desk, create an environment that supports good, upright posture. Without extra support, muscles fatigue and lead to excessive strain on joints and other tissues. Keep these recommendations in mind: CHAIR:   A chair should be able to slide under your desk when your back makes contact with the back of the chair. This allows you to work closely.  The chair's height should allow your eyes to be level with the upper part of your monitor and your hands to be slightly lower  than your elbows. BODY POSITION  Your feet should make contact with the floor. If this is not possible, use a foot rest.  Keep your ears over your shoulders. This will reduce stress on your neck and low back. INCORRECT SITTING POSTURES   If you are feeling tired and unable to assume a healthy sitting posture, do not slouch or slump. This puts excessive strain on your back tissues, causing more damage and pain. Healthier options include:  Using more support, like a lumbar pillow.  Switching tasks to something that requires you to be upright or walking.  Talking a brief walk.  Lying down to rest in a neutral-spine position. PROLONGED STANDING WHILE SLIGHTLY LEANING FORWARD  When completing a task that requires you to lean forward while standing in one place for a long time, place either foot up on a stationary 2-4 inch high object to help maintain the best posture. When both feet are on the ground, the low back tends to lose its slight inward curve. If this curve flattens (or becomes too large), then the back and your other joints will experience too much stress, fatigue more quickly and can cause pain.  CORRECT STANDING POSTURES Proper standing posture should be assumed with all daily activities, even if they only take a few moments, like when brushing your teeth. As in sitting, your ears should fall over your shoulders and your shoulders should fall over your hips. You should keep a slight tension in your abdominal muscles to brace your spine. Your tailbone should point down to the ground, not behind your body, resulting in an over-extended swayback posture.  INCORRECT STANDING POSTURES  Common incorrect standing postures include a forward head, locked knees and/or an excessive swayback. WALKING Walk with an upright posture. Your ears, shoulders and hips should all line-up. PROLONGED ACTIVITY IN A FLEXED POSITION When completing a task that requires you to bend forward at your waist or lean  over a low surface, try to find a way to stabilize 3 of 4 of your limbs. You can place a hand or elbow  on your thigh or rest a knee on the surface you are reaching across. This will provide you more stability so that your muscles do not fatigue as quickly. By keeping your knees relaxed, or slightly bent, you will also reduce stress across your low back. CORRECT LIFTING TECHNIQUES DO :   Assume a wide stance. This will provide you more stability and the opportunity to get as close as possible to the object which you are lifting.  Tense your abdominals to brace your spine; then bend at the knees and hips. Keeping your back locked in a neutral-spine position, lift using your leg muscles. Lift with your legs, keeping your back straight.  Test the weight of unknown objects before attempting to lift them.  Try to keep your elbows locked down at your sides in order get the best strength from your shoulders when carrying an object.  Always ask for help when lifting heavy or awkward objects. INCORRECT LIFTING TECHNIQUES DO NOT:   Lock your knees when lifting, even if it is a small object.  Bend and twist. Pivot at your feet or move your feet when needing to change directions.  Assume that you cannot safely pick up a paperclip without proper posture.   This information is not intended to replace advice given to you by your health care provider. Make sure you discuss any questions you have with your health care provider.   Document Released: 09/12/2005 Document Revised: 01/27/2015 Document Reviewed: 12/25/2008 Elsevier Interactive Patient Education [employer].

## 2016-03-03 NOTE — Progress Notes (Signed)
Subjective:    Patient ID: Jerry Rios, male    DOB: Mar 23, 1951, 65 y.o.   MRN: [id number]   Jerry Rios is a 65 y.o. male who presents today for an acute visit.    HPI Comments: Had TIA last year where had numbness, tingling, dizziness, on right side and leg.MR 10/2014 was negative.    Had one episode recently of right leg pain and posterior numbness in thigh to knee after driving for 3 hours, which resolved on its own. He did not have vision changes, dizziness, weakness. No saddle anesthesia or urinary incontinence. No h/o falls. No h/o low back pain to his knowledge.   Shoulder Injury  Incident location: at park with grandchild on monkey bars. Both (worse in left) shoulders are affected. The incident occurred more than 1 week ago. The quality of the pain is described as aching. Radiates to: left elbow. The pain is moderate. Associated symptoms include numbness (resolved, right thigh). Pertinent negatives include no chest pain or tingling. Exacerbated by: lay on shoulder in bed; raising arms up  Treatments tried: 325 mg ASA. The treatment provided mild relief.   Past Medical History  Diagnosis Date  . Insomnia   . Allergy   . Hyperlipidemia   . History of nephrolithiasis   . BPH (benign prostatic hypertrophy)   . Diverticulosis of colon   . Hx of colonic polyp   . History of shingles   . Libido, decreased 10/04/2011  . Vitiligo 10/31/2012   Allergies: Atorvastatin; Rosuvastatin; Simvastatin; and Adhesive Current Outpatient Prescriptions on File Prior to Visit  Medication Sig Dispense Refill  . aspirin 81 MG tablet Take 81 mg by mouth daily.    . bisacodyl (DULCOLAX) 5 MG EC tablet Take 5 mg by mouth daily as needed.    . cetirizine (ZYRTEC) 10 MG tablet Take 10 mg by mouth daily as needed for allergies.    . cholecalciferol (VITAMIN D) 1000 UNITS tablet Take 1,000 Units by mouth every morning.    Marland Kitchen co-enzyme Q-10 30 MG capsule Take 100 mg by mouth daily.    Marland Kitchen docusate  sodium (COLACE) 100 MG capsule Take 100 mg by mouth daily as needed for mild constipation.    . fluticasone (FLONASE) 50 MCG/ACT nasal spray Place 2 sprays into both nostrils daily. 48 g 5  . Melatonin 5 MG TABS Take 1 tablet by mouth at bedtime.     . metroNIDAZOLE (METROGEL) 1 % gel Apply 1 application topically daily as needed (rosacea).    . Naphazoline-Pheniramine (OPCON-A OP) Apply 1 drop to eye daily.    Marland Kitchen OVER THE COUNTER MEDICATION Trace mineral supplement daily- 20 drops    . Pitavastatin Calcium 4 MG TABS Take 1 tablet (4 mg total) by mouth daily. 30 tablet 11  . psyllium (METAMUCIL) 58.6 % powder Take 1 packet by mouth daily.     No current facility-administered medications on file prior to visit.    Social History  Substance Use Topics  . Smoking status: Former Research scientist (life sciences)  . Smokeless tobacco: Never Used     Comment: Regular exercise - No  . Alcohol Use: 4.2 oz/week    7 Cans of beer per week    Review of Systems  Constitutional: Negative for fever and chills.  HENT: Negative for congestion and ear pain.   Respiratory: Negative for cough, shortness of breath and wheezing.   Cardiovascular: Negative for chest pain, palpitations and leg swelling.  Gastrointestinal: Negative for nausea, vomiting  and diarrhea.  Genitourinary: Negative for dysuria.  Musculoskeletal: Negative for myalgias, back pain and neck pain.  Skin: Negative for rash.  Neurological: Positive for numbness (resolved, right thigh). Negative for dizziness (occasioanal, no triggers), tingling, syncope and headaches.  Hematological: Negative for adenopathy.  Psychiatric/Behavioral: Negative for confusion.      Objective:    BP 110/64 mmHg  Pulse 55  Temp(Src) 97.5 F (36.4 C) (Oral)  Ht 5\' 7"  (1.702 m)  Wt 159 lb 3 oz (72.207 kg)  BMI 24.93 kg/m2  SpO2 98%   Physical Exam  Constitutional: He appears well-developed and well-nourished.  Cardiovascular: Regular rhythm and normal heart sounds.     Pulmonary/Chest: Effort normal and breath sounds normal. No respiratory distress. He has no wheezes. He has no rhonchi. He has no rales.  Musculoskeletal:       Right shoulder: He exhibits normal range of motion, no tenderness, no bony tenderness, no pain, no spasm and normal strength.       Left shoulder: He exhibits decreased range of motion and tenderness. He exhibits no bony tenderness, no swelling, no deformity, no pain, no spasm and normal strength.       Lumbar back: He exhibits normal range of motion, no tenderness, no pain and no spasm.       Right upper leg: He exhibits no tenderness, no bony tenderness and no swelling.  Left Shoulder:   No asymmetry of shoulders when comparing right and left.No pain with palpation over glenohumeral joint lines, [place] joint,  or bicipital groove. Mild tenderness over AC joint. No pain with internal or external rotation. Mild pain with resisted lateral extension. Positive active painful arc sign. Negative passive arc ( Neer's). Negative drop arm. No pain, swelling, or ecchymosis noted over long head of biceps.   Right Shoulder:  No pain with palpation of joint. Full passive and active ROM.   Low back: Negative SLR bilateral legs.   Right thigh:  No pain with deep palpation. No masses, atrophy appreciated.   Lymphadenopathy:       Head (left side): No submandibular and no preauricular adenopathy present.  Neurological: He is alert.  BLE strength and sensation intact.   Skin: Skin is warm and dry.  Psychiatric: He has a normal mood and affect. His speech is normal and behavior is normal.  Vitals reviewed.      Assessment & Plan:  1. Left shoulder pain Working diagnosis of adhesive capsulitis. Patient agreed on conservative management at this time a trial of topical NSAID and home exercises. Patient does not improve, we will order MRI and refer patient to sports medicine.  - diclofenac sodium (VOLTAREN) 1 % GEL; Apply 4 g topically 4 (four)  times daily.  Dispense: 1 Tube; Refill: 3  2. Right leg pain Working diagnosis of right leg pain appears to be from low back pain and radicular symptoms. When episode occurred patient had been sitting in a car for 3 hours and episode with self limiting. He did not have any systemic features including dizziness or right upper extremity weakness and numbness to support TIA. We discussed at length the importance of prevention comes to TIA and CVA regarding daily aspirin and cholesterol management ( which has improved per chart review). Patient agreed to stay vigilant regarding signs and symptoms of stroke.   We also agreed on conservative measures with low back exercises. If this fails will refer patient to sports medicine.      I am having Mr. Vernet  maintain his bisacodyl, Melatonin, cholecalciferol, cetirizine, metroNIDAZOLE, Naphazoline-Pheniramine (OPCON-A OP), OVER THE COUNTER MEDICATION, co-enzyme Q-10, psyllium, docusate sodium, aspirin, fluticasone, and Pitavastatin Calcium.   No orders of the defined types were placed in this encounter.     Start medications as prescribed and explained to patient on After Visit Summary ( AVS). Risks, benefits, and alternatives of the medications and treatment plan prescribed today were discussed, and patient expressed understanding.   Education regarding symptom management and diagnosis given to patient.   Follow-up:Plan follow-up and return precautions given if any worsening symptoms or change in condition.   Continue to follow with Cathlean Cower, MD for routine health maintenance.   [name] and I agreed with plan.   Mable Paris, FNP   iv

## 2016-03-03 NOTE — Progress Notes (Signed)
Pre visit review using our clinic review tool, if applicable. No additional management support is needed unless otherwise documented below in the visit note. 

## 2016-03-30 ENCOUNTER — Encounter: Payer: Self-pay | Admitting: Internal Medicine

## 2016-03-30 DIAGNOSIS — H9122 Sudden idiopathic hearing loss, left ear: Secondary | ICD-10-CM | POA: Insufficient documentation

## 2016-03-30 DIAGNOSIS — H9313 Tinnitus, bilateral: Secondary | ICD-10-CM | POA: Insufficient documentation

## 2016-03-31 ENCOUNTER — Ambulatory Visit: Payer: 59 | Admitting: Internal Medicine

## 2016-04-04 ENCOUNTER — Encounter: Payer: Self-pay | Admitting: Internal Medicine

## 2016-04-07 ENCOUNTER — Telehealth: Payer: Self-pay | Admitting: Internal Medicine

## 2016-04-07 NOTE — Telephone Encounter (Signed)
Patient with several week history of abdominal pain.  He is offered an appt for next week with an APP, but he is not able to come .  He will see Nicoletta Ba PA on 04/18/16 10:00

## 2016-04-18 ENCOUNTER — Encounter: Payer: Self-pay | Admitting: Physician Assistant

## 2016-04-18 ENCOUNTER — Ambulatory Visit (INDEPENDENT_AMBULATORY_CARE_PROVIDER_SITE_OTHER): Payer: 59 | Admitting: Physician Assistant

## 2016-04-18 ENCOUNTER — Other Ambulatory Visit: Payer: 59

## 2016-04-18 VITALS — BP 122/70 | HR 60 | Ht 67.0 in | Wt 153.4 lb

## 2016-04-18 DIAGNOSIS — R1012 Left upper quadrant pain: Secondary | ICD-10-CM

## 2016-04-18 DIAGNOSIS — R1032 Left lower quadrant pain: Secondary | ICD-10-CM

## 2016-04-18 MED ORDER — METRONIDAZOLE 500 MG PO TABS: 500.0000 mg | ORAL_TABLET | Freq: Two times a day (BID) | ORAL | 0 refills | Status: DC

## 2016-04-18 MED ORDER — CIPROFLOXACIN HCL 500 MG PO TABS: 500.0000 mg | ORAL_TABLET | Freq: Two times a day (BID) | ORAL | 0 refills | Status: DC

## 2016-04-18 NOTE — Progress Notes (Signed)
Agree with initial assessment and plans as outlined 

## 2016-04-18 NOTE — Patient Instructions (Signed)
Please go to the basement level to have your labs drawn.  We sent prescriptions to [facility] for Cipro and Flagyl.   You will be due for a colonoscopy 2018.

## 2016-04-18 NOTE — Progress Notes (Signed)
Subjective:    Patient ID: Jerry Rios, male    DOB: [birth year], 65 y.o.   MRN: [id number]  HPI Torin is a 65 year old white male known to Dr. Scarlette Shorts. He has history of nephrolithiasis, prior TIA, and adenomatous colon polyps. He last had colonoscopy in 2013, at that time did not have any polyps but was noted to have moderate diverticulosis in the descending to sigmoid colon. He had had a serrated adenoma in 2007. He is slated for follow-up colonoscopy in 2018. He comes in today with complaints of intermittent left lower abdominal pain over the past couple of months. He describes this as a dull ache, some days worse than others. He says since she made the appointment over the past couple of weeks the discomfort has subsided a bit. His bowel movements have been normal he has not noticed any melena or hematochezia. Appetite is been fine needing does not seem to exacerbate his discomfort. He has not had any fever or chills no nausea or vomiting. He says he is been doing some reading and thinks his symptoms may be consistent with H. pylori. He would like to be tested.  Review of Systems Pertinent positive and negative review of systems were noted in the above HPI section.  All other review of systems was otherwise negative.  Outpatient Encounter Prescriptions as of 04/18/2016  Medication Sig  . aspirin 81 MG tablet Take 81 mg by mouth daily.  . bisacodyl (DULCOLAX) 5 MG EC tablet Take 5 mg by mouth daily as needed.  . cetirizine (ZYRTEC) 10 MG tablet Take 10 mg by mouth daily as needed for allergies.  . cholecalciferol (VITAMIN D) 1000 UNITS tablet Take 1,000 Units by mouth every morning.  Marland Kitchen co-enzyme Q-10 30 MG capsule Take 100 mg by mouth daily.  . diclofenac sodium (VOLTAREN) 1 % GEL Apply 4 g topically 4 (four) times daily.  Marland Kitchen docusate sodium (COLACE) 100 MG capsule Take 100 mg by mouth daily as needed for mild constipation.  . fluticasone (FLONASE) 50 MCG/ACT nasal spray Place 2  sprays into both nostrils daily.  . Melatonin 5 MG TABS Take 1 tablet by mouth at bedtime.   . metroNIDAZOLE (METROGEL) 1 % gel Apply 1 application topically daily as needed (rosacea).  . Naphazoline-Pheniramine (OPCON-A OP) Apply 1 drop to eye daily.  Marland Kitchen OVER THE COUNTER MEDICATION Trace mineral supplement daily- 20 drops  . Pitavastatin Calcium 4 MG TABS Take 1 tablet (4 mg total) by mouth daily.  . psyllium (METAMUCIL) 58.6 % powder Take 1 packet by mouth daily.  . ciprofloxacin (CIPRO) 500 MG tablet Take 1 tablet (500 mg total) by mouth 2 (two) times daily.  . metroNIDAZOLE (FLAGYL) 500 MG tablet Take 1 tablet (500 mg total) by mouth 2 (two) times daily.   No facility-administered encounter medications on file as of 04/18/2016.    Allergies  Allergen Reactions  . Atorvastatin     REACTION: myalgia  . Rosuvastatin     REACTION: myalgia  . Simvastatin     REACTION: myalgia  . Adhesive [Tape] Rash   Patient Active Problem List   Diagnosis Date Noted  . Hyperglycemia 11/19/2014  . TIA (transient ischemic attack) 11/18/2014  . Abdominal pain, unspecified site 05/30/2014  . Nausea in adult patient 05/30/2014  . Loss of weight 05/30/2014  . Vitiligo 10/31/2012  . Libido, decreased 10/04/2011  . Preventative health care 10/01/2011  . RENAL INSUFFICIENCY 09/07/2010  . ECZEMA, ATOPIC 11/12/2009  . HEMORRHOIDS  07/10/2008  . BACK PAIN, LUMBAR 07/10/2008  . Hyperlipidemia 08/05/2007  . DIVERTICULOSIS, COLON 08/05/2007  . BENIGN PROSTATIC HYPERTROPHY 08/05/2007  . COLONIC POLYPS, HX OF 08/05/2007  . NEPHROLITHIASIS, HX OF 08/05/2007  . Depression 04/08/2007  . Allergic rhinitis 04/08/2007  . Insomnia, unspecified 04/08/2007   Social History   Social History  . Marital status: Single    Spouse name: N/A  . Number of children: N/A  . Years of education: N/A   Occupational History  . IT    Social History Main Topics  . Smoking status: Former Research scientist (life sciences)  . Smokeless tobacco:  Never Used     Comment: Regular exercise - No  . Alcohol use 4.2 oz/week    7 Cans of beer per week  . Drug use: No  . Sexual activity: No   Other Topics Concern  . Not on file   Social History Narrative   Works at [employer], divorce, no biological children    Mr. Billock family history includes Colitis in his sister; Heart disease in his other; Heart failure in his father, maternal grandfather, and mother; Hyperlipidemia in his other; Hypertension in his other; Lupus in his paternal grandfather; Ulcerative colitis in his sister.      Objective:    Vitals:   04/18/16 0958  BP: 122/70  Pulse: 60    Physical Exam  well-developed older white male in no acute distress, pleasant blood pressure 122/70 pulse 60 BMI 23.7. HEENT; nontraumatic normocephalic EOMI PERRLA sclera anicteric, Cardiovascular ;regular rate and rhythm with S1-S2 no murmur or gallop, Pulmonary ;clear bilaterally, Abdomen ;soft he has very minimal tenderness in the left mid quadrant there is no guarding or rebound no palpable mass or hepatosplenomegaly bowel sounds are present  rectal exam; not done, Ext; no clubbing cyanosis or edema skin warm and dry, Neuropsych ;mood and affect appropriate       Assessment & Plan:   #29 65 year old white male with left mid quadrant discomfort intermittent 2 months. Etiology is not clear, he may have some mild smoldering diverticulitis. #2  history of adenomatous and a  serrated adenoma. Follow-up colonoscopy recommended for 2018 #3 history of nephrolithiasis #4 history of TIA  Plan; check H. pylori antibody Start Cipro 500 mg by mouth twice a day 10 days and Flagyl 500 mg by mouth twice a day 10 days. As patient says his discomfort has been a bit better over the past week he would like to hold on the antibiotics and observe. I asked him to call should he have any recurrence of the dull ache and at that point would like him to go ahead with a course of antibiotics. We also  discussed CT of the abdomen and pelvis, but he prefers to wait at this time.    Mariacristina Aday Genia Harold PA-C 04/18/2016   Cc: [name], MD

## 2016-04-19 LAB — H. PYLORI ANTIBODY, IGG

## 2016-04-21 ENCOUNTER — Other Ambulatory Visit: Payer: Self-pay

## 2016-04-21 MED ORDER — OMEPRAZOLE 20 MG PO CPDR: 20.0000 mg | DELAYED_RELEASE_CAPSULE | Freq: Two times a day (BID) | ORAL | 0 refills | Status: DC

## 2016-04-21 MED ORDER — BIS SUBCIT-METRONID-TETRACYC 140-125-125 MG PO CAPS: 3.0000 | ORAL_CAPSULE | Freq: Three times a day (TID) | ORAL | 0 refills | Status: DC

## 2016-06-22 ENCOUNTER — Encounter: Payer: Self-pay | Admitting: Internal Medicine

## 2016-06-22 MED ORDER — ROSUVASTATIN CALCIUM 20 MG PO TABS: 20.0000 mg | ORAL_TABLET | Freq: Every day | ORAL | 3 refills | Status: DC

## 2016-07-10 ENCOUNTER — Encounter: Payer: Self-pay | Admitting: Internal Medicine

## 2016-07-19 ENCOUNTER — Emergency Department (HOSPITAL_COMMUNITY)
Admission: EM | Admit: 2016-07-19 | Discharge: 2016-07-20 | Disposition: A | Discharge status: Home / Self Care | Payer: 59 | Attending: Emergency Medicine | Admitting: Emergency Medicine

## 2016-07-19 ENCOUNTER — Encounter (HOSPITAL_COMMUNITY): Payer: Self-pay | Admitting: Emergency Medicine

## 2016-07-19 DIAGNOSIS — I10 Essential (primary) hypertension: Secondary | ICD-10-CM | POA: Diagnosis not present

## 2016-07-19 DIAGNOSIS — Z7982 Long term (current) use of aspirin: Secondary | ICD-10-CM | POA: Insufficient documentation

## 2016-07-19 DIAGNOSIS — Z87891 Personal history of nicotine dependence: Secondary | ICD-10-CM | POA: Insufficient documentation

## 2016-07-19 DIAGNOSIS — Z8673 Personal history of transient ischemic attack (TIA), and cerebral infarction without residual deficits: Secondary | ICD-10-CM | POA: Insufficient documentation

## 2016-07-19 HISTORY — DX: Transient cerebral ischemic attack, unspecified: G45.9

## 2016-07-19 NOTE — ED Triage Notes (Signed)
Pt reports to the ER with concerns of elevated BP; pt usually has a BP around 120/60 and in the past month is has been slightly elevated; pt developed a migraine around 930pm tonight and had a BP reading of 199/83; denies numbness and tingling; A&Ox4

## 2016-07-19 NOTE — ED Provider Notes (Signed)
[facility] DEPT Provider Note   CSN: [id number] Arrival date & time: 07/19/16  2300  By signing my name below, I, Jerry Rios, attest that this documentation has been prepared under the direction and in the presence of [name], PA-C. Electronically Signed: Irene Rios, ED Scribe. 07/19/16. 11:50 PM.  History   Chief Complaint Chief Complaint  Patient presents with  . Hypertension   The history is provided by the patient. No language interpreter was used.   HPI Comments: Jerry Rios is a 65 y.o. Male with a hx of TIA and hyperlipidemia who presents to the Emergency Department complaining of increased BP onset one month ago. Pt reports associated left temporal headache that began 2 hours ago. Pt has mild abdominal pain. He measured his BP this evening and it was 199/83. He reports that he ate salty food at a restaurant and had stressors at work earlier today. He says that his BP is typically around 105/60, but has been slightly elevated to the 120s over the past month. Pt contacted his PCP and was told that as long as his BP did not go over the 140s with a headache, his BP in the 120s would be okay. Pt does not take BP medication on a regular basis. Pt did not take medication for the headache PTA. Pt denies nausea, vomiting, chest pain, SOB, visual changes, numbness or weakness. Pt takes 81 mg aspirin and Crestor daily. He is not on blood thinners.   Past Medical History:  Diagnosis Date  . Allergy   . BPH (benign prostatic hypertrophy)   . Diverticulosis of colon   . History of nephrolithiasis   . History of shingles   . Hx of colonic polyp   . Hyperlipidemia   . Insomnia   . Libido, decreased 10/04/2011  . TIA (transient ischemic attack)   . Vitiligo 10/31/2012    Patient Active Problem List   Diagnosis Date Noted  . Hyperglycemia 11/19/2014  . TIA (transient ischemic attack) 11/18/2014  . Abdominal pain, unspecified site 05/30/2014  . Nausea in adult patient  05/30/2014  . Loss of weight 05/30/2014  . Vitiligo 10/31/2012  . Libido, decreased 10/04/2011  . Preventative health care 10/01/2011  . RENAL INSUFFICIENCY 09/07/2010  . ECZEMA, ATOPIC 11/12/2009  . HEMORRHOIDS 07/10/2008  . BACK PAIN, LUMBAR 07/10/2008  . Hyperlipidemia 08/05/2007  . DIVERTICULOSIS, COLON 08/05/2007  . BENIGN PROSTATIC HYPERTROPHY 08/05/2007  . COLONIC POLYPS, HX OF 08/05/2007  . NEPHROLITHIASIS, HX OF 08/05/2007  . Depression 04/08/2007  . Allergic rhinitis 04/08/2007  . Insomnia, unspecified 04/08/2007    Past Surgical History:  Procedure Laterality Date  . CYSTOSCOPY WITH URETEROSCOPY AND STENT PLACEMENT Left 07/04/2014   Procedure: CYSTOSCOPY WITH LEFT RETROGRADE PYLEOGRAM, LEFT URETERAL DILATION AND URETEROSCOPY, LASER LITHOTRISPY AND STENT PLACEMENT;  Surgeon: [name], MD;  Location: [facility];  Service: Urology;  Laterality: Left;  . HOLMIUM LASER APPLICATION N/A [id number]   Procedure:  LASER LITHOTRIPSY;  Surgeon: [name], MD;  Location: [facility];  Service: Urology;  Laterality: N/A;  . lithotrypsy     x 3  . TONSILLECTOMY     Home Medications    Prior to Admission medications   Medication Sig Start Date End Date Taking? Authorizing Provider  aspirin 81 MG tablet Take 81 mg by mouth daily.    Historical Provider, MD  bisacodyl (DULCOLAX) 5 MG EC tablet Take 5 mg by mouth daily as needed.    Historical Provider, MD  bismuth-metronidazole-tetracycline (  PYLERA) 140-125-125 MG capsule Take 3 capsules by mouth 4 (four) times daily -  before meals and at bedtime. 04/21/16   Amy S Esterwood, PA-C  cetirizine (ZYRTEC) 10 MG tablet Take 10 mg by mouth daily as needed for allergies.    Historical Provider, MD  cholecalciferol (VITAMIN D) 1000 UNITS tablet Take 1,000 Units by mouth every morning.    Historical Provider, MD  ciprofloxacin (CIPRO) 500 MG tablet Take 1 tablet (500 mg total) by mouth 2 (two) times daily. 04/18/16   Amy S Esterwood, PA-C   co-enzyme Q-10 30 MG capsule Take 100 mg by mouth daily.    Historical Provider, MD  diclofenac sodium (VOLTAREN) 1 % GEL Apply 4 g topically 4 (four) times daily. 03/03/16   Burnard Hawthorne, FNP  docusate sodium (COLACE) 100 MG capsule Take 100 mg by mouth daily as needed for mild constipation.    Historical Provider, MD  fluticasone (FLONASE) 50 MCG/ACT nasal spray Place 2 sprays into both nostrils daily. 07/15/15   [name], MD  Melatonin 5 MG TABS Take 1 tablet by mouth at bedtime.     Historical Provider, MD  metroNIDAZOLE (METROGEL) 1 % gel Apply 1 application topically daily as needed (rosacea).    Historical Provider, MD  Naphazoline-Pheniramine (OPCON-A OP) Apply 1 drop to eye daily.    Historical Provider, MD  omeprazole (PRILOSEC) 20 MG capsule Take 1 capsule (20 mg total) by mouth 2 (two) times daily before a meal. 04/21/16   Amy S Esterwood, PA-C  OVER THE COUNTER MEDICATION Trace mineral supplement daily- 20 drops    Historical Provider, MD  Pitavastatin Calcium 4 MG TABS Take 1 tablet (4 mg total) by mouth daily. 09/04/15   [name], MD  psyllium (METAMUCIL) 58.6 % powder Take 1 packet by mouth daily.    Historical Provider, MD  rosuvastatin (CRESTOR) 20 MG tablet Take 1 tablet (20 mg total) by mouth daily. 06/22/16   [name], MD    Family History Family History  Problem Relation Age of Onset  . Ulcerative colitis Sister   . Colitis Sister   . Heart disease Other     Grandparents  . Hyperlipidemia Other   . Hypertension Other   . Heart failure Mother   . Heart failure Father   . Lupus Paternal Grandfather   . Heart failure Maternal Grandfather   . Colon cancer Neg Hx   . Esophageal cancer Neg Hx   . Pancreatic cancer Neg Hx   . Stomach cancer Neg Hx   . Liver disease Neg Hx     Social History Social History  Substance Use Topics  . Smoking status: Former Research scientist (life sciences)  . Smokeless tobacco: Never Used     Comment: Regular exercise - No  . Alcohol use 4.2  oz/week    7 Cans of beer per week   Allergies   Atorvastatin; Rosuvastatin; Simvastatin; and Adhesive [tape]  Review of Systems Review of Systems A complete 10 system review of systems was obtained and all systems are negative except as noted in the HPI and PMH.   Physical Exam Updated Vital Signs BP 161/93 (BP Location: Right Arm)   Pulse 81   Temp 97.7 F (36.5 C) (Oral)   Resp 14   Ht 5\' 7"  (1.702 m)   Wt 145 lb (65.8 kg)   SpO2 100%   BMI 22.71 kg/m   Physical Exam  Constitutional: He is oriented to person, place, and time.  Vital signs are normal. He appears well-developed and well-nourished.  HENT:  Head: Normocephalic and atraumatic.  Right Ear: Hearing normal.  Left Ear: Hearing normal.  Eyes: Conjunctivae and EOM are normal. Pupils are equal, round, and reactive to light.  Neck: Normal range of motion. Neck supple.  Cardiovascular: Normal rate, regular rhythm and normal heart sounds.  Exam reveals no gallop and no friction rub.   No murmur heard. Pulmonary/Chest: Effort normal and breath sounds normal. He has no wheezes.  Abdominal: Soft. There is no tenderness.  Musculoskeletal: Normal range of motion.  Neurological: He is alert and oriented to person, place, and time. He has normal strength. No cranial nerve deficit or sensory deficit.  Cranial Nerves:  II: Pupils equal, round, reactive to light III,IV, VI: ptosis not present, extra-ocular motions intact bilaterally  V,VII: smile symmetric, facial light touch sensation equal VIII: hearing grossly normal bilaterally  IX,X: midline uvula rise  XI: bilateral shoulder shrug equal and strong XII: midline tongue extension  Skin: Skin is warm and dry.  Psychiatric: He has a normal mood and affect. His speech is normal and behavior is normal. Thought content normal.  Nursing note and vitals reviewed.  ED Treatments / Results   DIAGNOSTIC STUDIES: Oxygen Saturation is 100% on RA, normal by my interpretation.     Labs (all labs ordered are listed, but only abnormal results are displayed) Labs Reviewed - No data to display  EKG  EKG Interpretation None      Radiology No results found.  Procedures Procedures (including critical care time)  Medications Ordered in ED Medications - No data to display   Initial Impression / Assessment and Plan / ED Course  I have reviewed the triage vital signs and the nursing notes.  Pertinent labs & imaging results that were available during my care of the patient were reviewed by me and considered in my medical decision making (see chart for details).  Clinical Course   Final Clinical Impressions(s) / ED Diagnoses  I have reviewed the relevant previous healthcare records. I obtained HPI from historian.  ED Course:  11:49 PM-Discussed treatment plan which includes pain medication and follow up with PCP with pt at bedside and pt agreed to plan.   Assessment: Patient is a 64yM that presents with headache x today with increase BP. Patient is without high-risk features of headache including: Sudden onset/thunderclap HA, No similar headache in past, Altered mental status, Accompanying seizure, Headache with exertion, Age > 50, History of immunocompromise, Neck or shoulder pain, Fever, Use of anticoagulation, Family history of spontaneous SAH, Concomitant drug use, Toxic exposure.  Patient has a normal complete neurological exam, normal vital signs, normal level of consciousness, no signs of meningismus, is well-appearing/non-toxic appearing, no signs of trauma. No papilledema, no pain over the temporal arteries. Imaging with CT/MRI not indicated given history and physical exam findings. No dangerous or life-threatening conditions suspected or identified by history, physical exam, and by work-up. No indications for hospitalization identified. Pt concerned with BP and told to come to ER if BP elevated with headache due to hx TIA in Feb last year. Scans  unremarkable at that time. Will DC patient home with follow up to PCP. BP controlled at visit today. Discussed with supervising physician. At time of discharge, Patient is in no acute distress. Vital Signs are stable. Patient is able to ambulate. Patient able to tolerate PO.    Disposition/Plan:  DC Home Additional Verbal discharge instructions given and discussed with patient.  Pt Instructed to f/u with PCP in the next week for evaluation and treatment of symptoms. Return precautions given Pt acknowledges and agrees with plan  Supervising Physician Rolland Porter, MD   I personally performed the services described in this documentation, which was scribed in my presence. The recorded information has been reviewed and is accurate.   Final diagnoses:  Hypertension, unspecified type    New Prescriptions New Prescriptions   No medications on file     [name], PA-C 07/20/16 0020    Rolland Porter, MD 07/20/16 [phone]

## 2016-07-19 NOTE — ED Notes (Signed)
Pt placed on monitor.  Pt stated "I've been stressed today because of work".  Pt denies visual disturbances/n/v/weakness.

## 2016-07-20 ENCOUNTER — Encounter: Payer: Self-pay | Admitting: Internal Medicine

## 2016-07-20 MED ORDER — ACETAMINOPHEN 325 MG PO TABS
650.0000 mg | ORAL_TABLET | Freq: Once | ORAL | Status: DC
  Filled 2016-07-20: qty 2

## 2016-07-20 MED ORDER — IBUPROFEN 200 MG PO TABS
400.0000 mg | ORAL_TABLET | Freq: Once | ORAL | Status: DC
  Filled 2016-07-20: qty 2

## 2016-07-20 NOTE — Discharge Instructions (Signed)
Please read and follow all provided instructions.  Your diagnoses today include:  1. Hypertension, unspecified type    Tests performed today include: Vital signs. See below for your results today.   Medications prescribed:  Take as prescribed   Home care instructions:  Follow any educational materials contained in this packet.  Follow-up instructions: Please follow-up with your primary care provider for further evaluation of symptoms and treatment   Return instructions:  Please return to the Emergency Department if you do not get better, if you get worse, or new symptoms OR  - Fever (temperature greater than 101.41F)  - Bleeding that does not stop with holding pressure to the area    -Severe pain (please note that you may be more sore the day after your accident)  - Chest Pain  - Difficulty breathing  - Severe nausea or vomiting  - Inability to tolerate food and liquids  - Passing out  - Skin becoming red around your wounds  - Change in mental status (confusion or lethargy)  - New numbness or weakness    Please return if you have any other emergent concerns.  Additional Information:  Your vital signs today were: BP 161/93 (BP Location: Right Arm)    Pulse 81    Temp 97.7 F (36.5 C) (Oral)    Resp 14    Ht 5\' 7"  (1.702 m)    Wt 65.8 kg    SpO2 100%    BMI 22.71 kg/m  If your blood pressure (BP) was elevated above 135/85 this visit, please have this repeated by your doctor within one month. ---------------

## 2016-07-20 NOTE — Telephone Encounter (Signed)
Corinne or staff to help pt make visit for HTN

## 2016-07-20 NOTE — Telephone Encounter (Signed)
Patient is already scheduled for 10/26

## 2016-07-21 ENCOUNTER — Ambulatory Visit (INDEPENDENT_AMBULATORY_CARE_PROVIDER_SITE_OTHER): Payer: 59 | Admitting: Internal Medicine

## 2016-07-21 ENCOUNTER — Encounter: Payer: Self-pay | Admitting: Internal Medicine

## 2016-07-21 VITALS — BP 130/82 | HR 58 | Temp 97.7°F | Wt 150.0 lb

## 2016-07-21 DIAGNOSIS — G459 Transient cerebral ischemic attack, unspecified: Secondary | ICD-10-CM

## 2016-07-21 DIAGNOSIS — I10 Essential (primary) hypertension: Secondary | ICD-10-CM | POA: Insufficient documentation

## 2016-07-21 DIAGNOSIS — E785 Hyperlipidemia, unspecified: Secondary | ICD-10-CM

## 2016-07-21 MED ORDER — HYDROCHLOROTHIAZIDE 12.5 MG PO CAPS: 12.5000 mg | ORAL_CAPSULE | Freq: Every day | ORAL | 3 refills | Status: DC

## 2016-07-21 NOTE — Progress Notes (Signed)
Subjective:    Patient ID: Jerry Rios, male    DOB: August 28, 1951, 65 y.o.   MRN: [id number]  HPI  Here to f/u with recent exacerbation of HTN; Pt denies chest pain, increased sob or doe, wheezing, orthopnea, PND, increased LE swelling, palpitations, dizziness or syncope. But for unexplained reason, has spike of BP to 198/100 at home, some better when seen promptly in ED. Had recent salty diet and home stressors, as well as some med non compliance.  HA soon resolved after ED visit, and BP improved BP Readings from Last 3 Encounters:  07/21/16 130/82  07/20/16 138/78  04/18/16 122/70  Now on crestor as insurance did not cover livalo. Had Ssoreness to ankles, hips, wrists, so asks for reduced dose or taking qod?   Pt denies polydipsia, polyuria Past Medical History:  Diagnosis Date  . Allergy   . BPH (benign prostatic hypertrophy)   . Diverticulosis of colon   . History of nephrolithiasis   . History of shingles   . Hx of colonic polyp   . Hyperlipidemia   . Insomnia   . Libido, decreased 10/04/2011  . TIA (transient ischemic attack)   . Vitiligo 10/31/2012   Past Surgical History:  Procedure Laterality Date  . CYSTOSCOPY WITH URETEROSCOPY AND STENT PLACEMENT Left 07/04/2014   Procedure: CYSTOSCOPY WITH LEFT RETROGRADE PYLEOGRAM, LEFT URETERAL DILATION AND URETEROSCOPY, LASER LITHOTRISPY AND STENT PLACEMENT;  Surgeon: [name], MD;  Location: [facility];  Service: Urology;  Laterality: Left;  . HOLMIUM LASER APPLICATION N/A [id number]   Procedure:  LASER LITHOTRIPSY;  Surgeon: [name], MD;  Location: [facility];  Service: Urology;  Laterality: N/A;  . lithotrypsy     x 3  . TONSILLECTOMY      reports that he has quit smoking. He has never used smokeless tobacco. He reports that he drinks about 4.2 oz of alcohol per week . He reports that he does not use drugs. family history includes Colitis in his sister; Heart disease in his other; Heart failure in his father,  maternal grandfather, and mother; Hyperlipidemia in his other; Hypertension in his other; Lupus in his paternal grandfather; Ulcerative colitis in his sister. Allergies  Allergen Reactions  . Atorvastatin     REACTION: myalgia  . Rosuvastatin     REACTION: myalgia  . Simvastatin     REACTION: myalgia  . Adhesive [Tape] Rash   Current Outpatient Prescriptions on File Prior to Visit  Medication Sig Dispense Refill  . aspirin 81 MG tablet Take 81 mg by mouth daily.    . bisacodyl (DULCOLAX) 5 MG EC tablet Take 5 mg by mouth daily as needed.    . bismuth-metronidazole-tetracycline (PYLERA) 140-125-125 MG capsule Take 3 capsules by mouth 4 (four) times daily -  before meals and at bedtime. 128 capsule 0  . cetirizine (ZYRTEC) 10 MG tablet Take 10 mg by mouth daily as needed for allergies.    . cholecalciferol (VITAMIN D) 1000 UNITS tablet Take 1,000 Units by mouth every morning.    . ciprofloxacin (CIPRO) 500 MG tablet Take 1 tablet (500 mg total) by mouth 2 (two) times daily. 20 tablet 0  . co-enzyme Q-10 30 MG capsule Take 100 mg by mouth daily.    . diclofenac sodium (VOLTAREN) 1 % GEL Apply 4 g topically 4 (four) times daily. 1 Tube 3  . docusate sodium (COLACE) 100 MG capsule Take 100 mg by mouth daily as needed for mild constipation.    Marland Kitchen  fluticasone (FLONASE) 50 MCG/ACT nasal spray Place 2 sprays into both nostrils daily. 48 g 5  . Melatonin 5 MG TABS Take 1 tablet by mouth at bedtime.     . metroNIDAZOLE (METROGEL) 1 % gel Apply 1 application topically daily as needed (rosacea).    . Naphazoline-Pheniramine (OPCON-A OP) Apply 1 drop to eye daily.    Marland Kitchen omeprazole (PRILOSEC) 20 MG capsule Take 1 capsule (20 mg total) by mouth 2 (two) times daily before a meal. 20 capsule 0  . OVER THE COUNTER MEDICATION Trace mineral supplement daily- 20 drops    . Pitavastatin Calcium 4 MG TABS Take 1 tablet (4 mg total) by mouth daily. 30 tablet 11  . psyllium (METAMUCIL) 58.6 % powder Take 1 packet  by mouth daily.    . rosuvastatin (CRESTOR) 20 MG tablet Take 1 tablet (20 mg total) by mouth daily. 90 tablet 3   No current facility-administered medications on file prior to visit.    Review of Systems  Constitutional: Negative for unusual diaphoresis or night sweats HENT: Negative for ear swelling or discharge Eyes: Negative for worsening visual haziness  Respiratory: Negative for choking and stridor.   Gastrointestinal: Negative for distension or worsening eructation Genitourinary: Negative for retention or change in urine volume.  Musculoskeletal: Negative for other MSK pain or swelling Skin: Negative for color change and worsening wound Neurological: Negative for tremors and numbness other than noted  Psychiatric/Behavioral: Negative for decreased concentration or agitation other than above   All otherwise neg per pt    Objective:   Physical Exam BP 130/82   Pulse (!) 58   Temp 97.7 F (36.5 C)   Wt 150 lb (68 kg)   SpO2 97%   BMI 23.49 kg/m  VS noted,  Constitutional: Pt appears in no apparent distress HENT: Head: NCAT.  Right Ear: External ear normal.  Left Ear: External ear normal.  Eyes: . Pupils are equal, round, and reactive to light. Conjunctivae and EOM are normal Neck: Normal range of motion. Neck supple.  Cardiovascular: Normal rate and regular rhythm.   Pulmonary/Chest: Effort normal and breath sounds without rales or wheezing.  Abd:  Soft, NT, ND, + BS; no bruit Neurological: Pt is alert. Not confused , motor grossly intact Skin: Skin is warm. No rash, trace bilat LE edema Psychiatric: Pt behavior is normal. No agitation.     Assessment & Plan:

## 2016-07-21 NOTE — Patient Instructions (Addendum)
Please take all new medication as prescribed - the HCT (mild fluid pill)  - 1 per day  ([facility])  You will be contacted regarding the referral for: Renal Artery ultrasound   Ok to try the crestor 20 mg at every other day  Please continue all other medications as before, and refills have been done if requested.  Please have the pharmacy call with any other refills you may need.  Please keep your appointments with your specialists as you may have planned  Please return in 6 months, or sooner if needed

## 2016-07-21 NOTE — Assessment & Plan Note (Signed)
asympt and stable, to cont asa and statin, and risk factor control including HTN

## 2016-07-21 NOTE — Progress Notes (Signed)
Pre visit review using our clinic review tool, if applicable. No additional management support is needed unless otherwise documented below in the visit note. 

## 2016-07-21 NOTE — Assessment & Plan Note (Addendum)
Recent uncontrolled with HA, no other neuro changes, now improved with lower salt diet, stress avoidance, med compliance, will also add HCT 12.5 qd for BP and edema today, cont to monitor BP at home and next visit BP Readings from Last 3 Encounters:  07/21/16 130/82  07/20/16 138/78  04/18/16 122/70   Also for renal artery duplex, r/o RAS

## 2016-07-21 NOTE — Assessment & Plan Note (Signed)
D/w pt, has some nonspecific joint pain so cant r/o statin relation, ok for crestor 20 qod and cont to monitor joint symptoms

## 2016-07-23 ENCOUNTER — Encounter (HOSPITAL_COMMUNITY): Payer: 59

## 2016-07-29 ENCOUNTER — Encounter: Payer: Self-pay | Admitting: Internal Medicine

## 2016-07-29 MED ORDER — FLUTICASONE PROPIONATE 50 MCG/ACT NA SUSP: 2.0000 | Freq: Every day | NASAL | 1 refills | Status: DC

## 2016-08-10 ENCOUNTER — Encounter: Payer: Self-pay | Admitting: Internal Medicine

## 2016-08-10 DIAGNOSIS — N501 Vascular disorders of male genital organs: Secondary | ICD-10-CM

## 2016-08-10 NOTE — Addendum Note (Signed)
Addended by: [name] on: 08/10/2016 12:20 PM   Modules accepted: Orders

## 2016-10-02 ENCOUNTER — Telehealth: Payer: 59 | Admitting: Family

## 2016-10-02 DIAGNOSIS — L249 Irritant contact dermatitis, unspecified cause: Secondary | ICD-10-CM

## 2016-10-02 MED ORDER — PREDNISONE 10 MG (21) PO TBPK: ORAL_TABLET | ORAL | 0 refills | Status: DC

## 2016-10-02 NOTE — Progress Notes (Signed)
E Visit for Rash  We are sorry that you are not feeling well. Here is how we plan to help!  Based on what you shared with me it looks like you have contact dermatitis.  Contact dermatitis is a skin rash caused by something that touches the skin and causes irritation or inflammation.  Your skin may be red, swollen, dry, cracked, and itch.  The rash should go away in a few days but can last a few weeks.  If you get a rash, it's important to figure out what caused it so the irritant can be avoided in the future. and I have prescribed Prednisone 10 mg daily for 5 days           HOME CARE:   Take cool showers and avoid direct sunlight.  Apply cool compress or wet dressings.  Take a bath in an oatmeal bath.  Sprinkle content of one Aveeno packet under running faucet with comfortably warm water.  Bathe for 15-20 minutes, 1-2 times daily.  Pat dry with a towel. Do not rub the rash.  Use hydrocortisone cream.  Take an antihistamine like Benadryl for widespread rashes that itch.  The adult dose of Benadryl is 25-50 mg by mouth 4 times daily.  Caution:  This type of medication may cause sleepiness.  Do not drink alcohol, drive, or operate dangerous machinery while taking antihistamines.  Do not take these medications if you have prostate enlargement.  Read package instructions thoroughly on all medications that you take.  GET HELP RIGHT AWAY IF:   Symptoms don't go away after treatment.  Severe itching that persists.  If you rash spreads or swells.  If you rash begins to smell.  If it blisters and opens or develops a yellow-brown crust.  You develop a fever.  You have a sore throat.  You become short of breath.  MAKE SURE YOU:  Understand these instructions. Will watch your condition. Will get help right away if you are not doing well or get worse.  Thank you for choosing an e-visit. Your e-visit answers were reviewed by a board certified advanced clinical practitioner to  complete your personal care plan. Depending upon the condition, your plan could have included both over the counter or prescription medications. Please review your pharmacy choice. Be sure that the pharmacy you have chosen is open so that you can pick up your prescription now.  If there is a problem you may message your provider in MyChart to have the prescription routed to another pharmacy. Your safety is important to us. If you have drug allergies check your prescription carefully.  For the next 24 hours, you can use MyChart to ask questions about today's visit, request a non-urgent call back, or ask for a work or school excuse from your e-visit provider. You will get an email in the next two days asking about your experience. I hope that your e-visit has been valuable and will speed your recovery.     

## 2016-10-05 ENCOUNTER — Encounter: Payer: Self-pay | Admitting: Internal Medicine

## 2016-10-14 ENCOUNTER — Encounter: Payer: Self-pay | Admitting: Internal Medicine

## 2016-10-14 DIAGNOSIS — R21 Rash and other nonspecific skin eruption: Secondary | ICD-10-CM

## 2016-10-19 NOTE — Telephone Encounter (Signed)
PCC's to see above 

## 2016-12-14 ENCOUNTER — Ambulatory Visit (INDEPENDENT_AMBULATORY_CARE_PROVIDER_SITE_OTHER): Payer: 59 | Admitting: Internal Medicine

## 2016-12-14 ENCOUNTER — Ambulatory Visit (INDEPENDENT_AMBULATORY_CARE_PROVIDER_SITE_OTHER)
Admission: RE | Admit: 2016-12-14 | Discharge: 2016-12-14 | Disposition: A | Discharge status: Home / Self Care | Payer: 59 | Source: Ambulatory Visit | Attending: Internal Medicine | Admitting: Internal Medicine

## 2016-12-14 ENCOUNTER — Encounter: Payer: Self-pay | Admitting: Internal Medicine

## 2016-12-14 VITALS — BP 104/68 | HR 62 | Temp 97.9°F | Wt 157.0 lb

## 2016-12-14 DIAGNOSIS — Z23 Encounter for immunization: Secondary | ICD-10-CM | POA: Diagnosis not present

## 2016-12-14 DIAGNOSIS — E785 Hyperlipidemia, unspecified: Secondary | ICD-10-CM | POA: Diagnosis not present

## 2016-12-14 DIAGNOSIS — Z0000 Encounter for general adult medical examination without abnormal findings: Secondary | ICD-10-CM

## 2016-12-14 DIAGNOSIS — E042 Nontoxic multinodular goiter: Secondary | ICD-10-CM

## 2016-12-14 DIAGNOSIS — M5441 Lumbago with sciatica, right side: Secondary | ICD-10-CM

## 2016-12-14 DIAGNOSIS — Z0001 Encounter for general adult medical examination with abnormal findings: Secondary | ICD-10-CM | POA: Diagnosis not present

## 2016-12-14 HISTORY — DX: Nontoxic multinodular goiter: E04.2

## 2016-12-14 NOTE — Assessment & Plan Note (Signed)
Ok for f/u thyroid US

## 2016-12-14 NOTE — Progress Notes (Addendum)
Subjective:    Patient ID: Jerry Rios, male    DOB: [birth year], 66 y.o.   MRN: [id number]  HPI  Here for wellness and f/u;  Overall doing ok;  Pt denies Chest pain, worsening SOB, DOE, wheezing, orthopnea, PND, worsening LE edema, palpitations, dizziness or syncope.  Pt denies neurological change such as new headache, facial or extremity weakness.  Pt denies polydipsia, polyuria, or low sugar symptoms. Pt states overall good compliance with treatment and medications, good tolerability, and has been trying to follow appropriate diet.  Pt denies worsening depressive symptoms, suicidal ideation or panic. No fever, night sweats, wt loss, loss of appetite, or other constitutional symptoms.  Pt states good ability with ADL's, has low fall risk, home safety reviewed and adequate, no other significant changes in hearing or vision, and occasionally active with exercise.  Still working full time - IT.  Did have sudden hearing loss 50% on left tx with steroid tx, now back to 90%.    Now taking crestor per cardiology at 10 mg every 2-3 days only, but asking to go back to daily livalo if not controlled.  Pt continues to have recurring midline and right LBP without change in severity, bowel or bladder change, fever, wt loss,  worsening  LE weakness, gait change or falls, but with recurring right leg pain and numbness with longer drives > 3 hrs, asking for plain films.  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  Only takes the hct prn as BP often lower and swelling not that bad to him  Past Medical History:  Diagnosis Date  . Allergy   . BPH (benign prostatic hypertrophy)   . Diverticulosis of colon   . History of nephrolithiasis   . History of shingles   . Hx of  colonic polyp   . Hyperlipidemia   . Insomnia   . Libido, decreased 10/04/2011  . TIA (transient ischemic attack)   . Vitiligo 10/31/2012   Past Surgical History:  Procedure Laterality Date  . CYSTOSCOPY WITH URETEROSCOPY AND STENT PLACEMENT Left 07/04/2014   Procedure: CYSTOSCOPY WITH LEFT RETROGRADE PYLEOGRAM, LEFT URETERAL DILATION AND URETEROSCOPY, LASER LITHOTRISPY AND STENT PLACEMENT;  Surgeon: [name], MD;  Location: [facility];  Service: Urology;  Laterality: Left;  . HOLMIUM LASER APPLICATION N/A 54/0/0867   Procedure:  LASER LITHOTRIPSY;  Surgeon: [name], MD;  Location: [facility];  Service: Urology;  Laterality: N/A;  . lithotrypsy     x 3  . TONSILLECTOMY      reports that he has quit smoking. He has never used smokeless tobacco. He reports that he drinks about 4.2 oz of alcohol per week . He reports that he does not use drugs. family history includes Colitis in his sister; Heart disease in his other; Heart failure in his father, maternal grandfather, and mother; Hyperlipidemia in his other; Hypertension in his other; Lupus in his paternal grandfather; Ulcerative colitis in his sister. Allergies  Allergen Reactions  . Atorvastatin     REACTION: myalgia  . Rosuvastatin     REACTION: myalgia  . Simvastatin     REACTION: myalgia  . Adhesive [Tape] Rash   Current Outpatient Prescriptions on File Prior to Visit  Medication Sig Dispense Refill  . aspirin 81  MG tablet Take 81 mg by mouth daily.    . bisacodyl (DULCOLAX) 5 MG EC tablet Take 5 mg by mouth daily as needed.    . bismuth-metronidazole-tetracycline (PYLERA) 140-125-125 MG capsule Take 3 capsules by mouth 4 (four) times daily -  before meals and at bedtime. 128 capsule 0  . cetirizine (ZYRTEC) 10 MG tablet Take 10 mg by mouth daily as needed for allergies.    . cholecalciferol (VITAMIN D) 1000 UNITS tablet Take 1,000 Units by mouth every morning.    Marland Kitchen co-enzyme Q-10 30 MG capsule Take 100 mg by mouth  daily.    . diclofenac sodium (VOLTAREN) 1 % GEL Apply 4 g topically 4 (four) times daily. 1 Tube 3  . docusate sodium (COLACE) 100 MG capsule Take 100 mg by mouth daily as needed for mild constipation.    . fluticasone (FLONASE) 50 MCG/ACT nasal spray Place 2 sprays into both nostrils daily. 48 g 1  . hydrochlorothiazide (MICROZIDE) 12.5 MG capsule Take 1 capsule (12.5 mg total) by mouth daily. 90 capsule 3  . Melatonin 5 MG TABS Take 1 tablet by mouth at bedtime.     . metroNIDAZOLE (METROGEL) 1 % gel Apply 1 application topically daily as needed (rosacea).    . Naphazoline-Pheniramine (OPCON-A OP) Apply 1 drop to eye daily.    Marland Kitchen omeprazole (PRILOSEC) 20 MG capsule Take 1 capsule (20 mg total) by mouth 2 (two) times daily before a meal. 20 capsule 0  . OVER THE COUNTER MEDICATION Trace mineral supplement daily- 20 drops    . Pitavastatin Calcium 4 MG TABS Take 1 tablet (4 mg total) by mouth daily. 30 tablet 11  . predniSONE (STERAPRED UNI-PAK 21 TAB) 10 MG (21) TBPK tablet Use as directed 21 tablet 0  . psyllium (METAMUCIL) 58.6 % powder Take 1 packet by mouth daily.    . rosuvastatin (CRESTOR) 20 MG tablet Take 1 tablet (20 mg total) by mouth daily. 90 tablet 3   No current facility-administered medications on file prior to visit.    Review of Systems Constitutional: Negative for increased diaphoresis, or other activity, appetite or siginficant weight change other than noted HENT: Negative for worsening hearing loss, ear pain, facial swelling, mouth sores and neck stiffness.   Eyes: Negative for other worsening pain, redness or visual disturbance.  Respiratory: Negative for choking or stridor Cardiovascular: Negative for other chest pain and palpitations.  Gastrointestinal: Negative for worsening diarrhea, blood in stool, or abdominal distention Genitourinary: Negative for hematuria, flank pain or change in urine volume.  Musculoskeletal: Negative for myalgias or other joint complaints.    Skin: Negative for other color change and wound or drainage.  Neurological: Negative for syncope and numbness. other than noted Hematological: Negative for adenopathy. or other swelling Psychiatric/Behavioral: Negative for hallucinations, SI, self-injury, decreased concentration or other worsening agitation.  All other system neg per pt    Objective:   Physical Exam BP 104/68   Pulse 62   Temp 97.9 F (36.6 C) (Oral)   Wt 157 lb (71.2 kg)   SpO2 98%   BMI 24.59 kg/m  VS noted,  Constitutional: Pt is oriented to person, place, and time. Appears well-developed and well-nourished, in no significant distress Head: Normocephalic and atraumatic  Eyes: Conjunctivae and EOM are normal. Pupils are equal, round, and reactive to light Right Ear: External ear normal.  Left Ear: External ear normal Nose: Nose normal.  Mouth/Throat: Oropharynx is clear and moist  Neck: Normal range of motion.  Neck supple. No JVD present. No tracheal deviation present or significant neck LA or mass except for thyroid with nodularity bilat Cardiovascular: Normal rate, regular rhythm, normal heart sounds and intact distal pulses.   Pulmonary/Chest: Effort normal and breath sounds without rales or wheezing  Abdominal: Soft. Bowel sounds are normal. NT. No HSM  Musculoskeletal: Normal range of motion. Exhibits trace bilat ankle edema, also mild mid lumbar tender Lymphadenopathy: Has no cervical adenopathy.  Neurological: Pt is alert and oriented to person, place, and time. Pt has normal reflexes. No cranial nerve deficit. Motor grossly intact Skin: Skin is warm and dry. No rash noted or new ulcers Psychiatric:  Has normal mood and affect. Behavior is normal.  No other exam findings  ECG today I have personally interpreted Sinus  Bradycardia  WITHIN NORMAL LIMITS     Assessment & Plan:

## 2016-12-14 NOTE — Assessment & Plan Note (Signed)
Suspect underlying DJD or DDD lumbar spine, but cant r/o other rheum issue, no prior films, ok for plain films, pain control

## 2016-12-14 NOTE — Patient Instructions (Addendum)
Please remember to call for your 5 yr colonoscopy as you are due.    You had the Prevnar 13 pneumonai shot today  You will be contacted regarding the referral for: thyroid ultrasound  Please continue all other medications as before, and refills have been done if requested.  Please have the pharmacy call with any other refills you may need.  Please continue your efforts at being more active, low cholesterol diet, and weight control.  You are otherwise up to date with prevention measures today.  Please keep your appointments with your specialists as you may have planned  Please go to the XRAY Department in the Basement (go straight as you get off the elevator) for the x-ray testing  You will be contacted by phone if any changes need to be made immediately.  Otherwise, you will receive a letter about your results with an explanation, but please check with MyChart first.  Please remember to sign up for MyChart if you have not done so, as this will be important to you in the future with finding out test results, communicating by private email, and scheduling acute appointments online when needed.  Please return in 1 year for your yearly visit, or sooner if needed, with Lab testing done 3-5 days before

## 2016-12-14 NOTE — Addendum Note (Signed)
Addended by: Juliet Rude on: 12/14/2016 10:35 AM   Modules accepted: Orders

## 2016-12-14 NOTE — Assessment & Plan Note (Signed)
midl elev LDL 11/2015 to 114, for f/u lab and cont lower chol diet, and crestor , but consider change to daily livalo per pt request if not well controlled

## 2016-12-14 NOTE — Progress Notes (Signed)
Pre visit review using our clinic review tool, if applicable. No additional management support is needed unless otherwise documented below in the visit note. 

## 2016-12-14 NOTE — Assessment & Plan Note (Signed)

## 2016-12-15 ENCOUNTER — Encounter: Payer: Self-pay | Admitting: Internal Medicine

## 2016-12-20 ENCOUNTER — Ambulatory Visit
Admission: RE | Admit: 2016-12-20 | Discharge: 2016-12-20 | Disposition: A | Discharge status: Home / Self Care | Payer: 59 | Source: Ambulatory Visit | Attending: Internal Medicine | Admitting: Internal Medicine

## 2016-12-20 DIAGNOSIS — E042 Nontoxic multinodular goiter: Secondary | ICD-10-CM

## 2016-12-21 ENCOUNTER — Ambulatory Visit (AMBULATORY_SURGERY_CENTER): Payer: Self-pay | Admitting: *Deleted

## 2016-12-21 VITALS — Ht 67.0 in | Wt 158.0 lb

## 2016-12-21 DIAGNOSIS — Z86010 Personal history of colonic polyps: Secondary | ICD-10-CM

## 2016-12-21 MED ORDER — NA SULFATE-K SULFATE-MG SULF 17.5-3.13-1.6 GM/177ML PO SOLN: 1.0000 | Freq: Once | ORAL | 0 refills | Status: AC

## 2016-12-21 NOTE — Progress Notes (Signed)
No egg or soy allergy known to patient  No issues with past sedation with any surgeries  or procedures, no intubation problems  No diet pills per patient No home 02 use per patient  No blood thinners per patient  Pt denies issues with constipation  No A fib or A flutter  emi video to e mail

## 2016-12-22 ENCOUNTER — Encounter: Payer: Self-pay | Admitting: Internal Medicine

## 2016-12-26 ENCOUNTER — Other Ambulatory Visit (INDEPENDENT_AMBULATORY_CARE_PROVIDER_SITE_OTHER): Payer: 59

## 2016-12-26 DIAGNOSIS — Z0001 Encounter for general adult medical examination with abnormal findings: Secondary | ICD-10-CM | POA: Diagnosis not present

## 2016-12-26 LAB — CBC WITH DIFFERENTIAL/PLATELET
Basophils Absolute: 0 10*3/uL (ref 0.0–0.1)
Basophils Relative: 0.6 % (ref 0.0–3.0)
EOS ABS: 0.1 10*3/uL (ref 0.0–0.7)
EOS PCT: 1.5 % (ref 0.0–5.0)
HCT: 42.5 % (ref 39.0–52.0)
HEMOGLOBIN: 14.8 g/dL (ref 13.0–17.0)
LYMPHS PCT: 41.7 % (ref 12.0–46.0)
Lymphs Abs: 2.6 10*3/uL (ref 0.7–4.0)
MCHC: 34.8 g/dL (ref 30.0–36.0)
MCV: 91.4 fl (ref 78.0–100.0)
Monocytes Absolute: 0.6 10*3/uL (ref 0.1–1.0)
Monocytes Relative: 9.4 % (ref 3.0–12.0)
Neutro Abs: 2.9 10*3/uL (ref 1.4–7.7)
Neutrophils Relative %: 46.8 % (ref 43.0–77.0)
Platelets: 219 10*3/uL (ref 150.0–400.0)
RBC: 4.65 Mil/uL (ref 4.22–5.81)
RDW: 13.4 % (ref 11.5–15.5)
WBC: 6.2 10*3/uL (ref 4.0–10.5)

## 2016-12-26 LAB — LIPID PANEL
CHOL/HDL RATIO: 3
Cholesterol: 262 mg/dL — ABNORMAL HIGH (ref 0–200)
HDL: 90.6 mg/dL (ref >39.00)
LDL CALC: 158 mg/dL — AB (ref 0–99)
NONHDL: 171.02
TRIGLYCERIDES: 64 mg/dL (ref 0.0–149.0)
VLDL: 12.8 mg/dL (ref 0.0–40.0)

## 2016-12-26 LAB — URINALYSIS, ROUTINE W REFLEX MICROSCOPIC
BILIRUBIN URINE: NEGATIVE
Hgb urine dipstick: NEGATIVE
KETONES UR: NEGATIVE
Leukocytes, UA: NEGATIVE
NITRITE: NEGATIVE
RBC / HPF: NONE SEEN (ref 0–?)
Specific Gravity, Urine: 1.01 (ref 1.000–1.030)
Total Protein, Urine: NEGATIVE
Urine Glucose: NEGATIVE
Urobilinogen, UA: 0.2 (ref 0.0–1.0)
WBC, UA: NONE SEEN (ref 0–?)
pH: 6 (ref 5.0–8.0)

## 2016-12-26 LAB — BASIC METABOLIC PANEL
BUN: 13 mg/dL (ref 6–23)
CALCIUM: 9.6 mg/dL (ref 8.4–10.5)
CO2: 31 mEq/L (ref 19–32)
CREATININE: 1.28 mg/dL (ref 0.40–1.50)
Chloride: 102 mEq/L (ref 96–112)
GFR: 59.7 mL/min — AB (ref >60.00)
Glucose, Bld: 107 mg/dL — ABNORMAL HIGH (ref 70–99)
Potassium: 4 mEq/L (ref 3.5–5.1)
Sodium: 140 mEq/L (ref 135–145)

## 2016-12-26 LAB — TSH: TSH: 2.3 u[IU]/mL (ref 0.35–4.50)

## 2016-12-26 LAB — HEPATIC FUNCTION PANEL
ALBUMIN: 4.3 g/dL (ref 3.5–5.2)
ALT: 14 U/L (ref 0–53)
AST: 21 U/L (ref 0–37)
Alkaline Phosphatase: 33 U/L — ABNORMAL LOW (ref 39–117)
BILIRUBIN TOTAL: 0.6 mg/dL (ref 0.2–1.2)
Bilirubin, Direct: 0.1 mg/dL (ref 0.0–0.3)
Total Protein: 6.7 g/dL (ref 6.0–8.3)

## 2016-12-26 LAB — PSA: PSA: 1.35 ng/mL (ref 0.10–4.00)

## 2017-01-03 ENCOUNTER — Encounter: Payer: Self-pay | Admitting: Internal Medicine

## 2017-01-03 ENCOUNTER — Ambulatory Visit (AMBULATORY_SURGERY_CENTER): Payer: 59 | Admitting: Internal Medicine

## 2017-01-03 VITALS — BP 90/54 | HR 47 | Temp 98.6°F | Resp 14 | Ht 67.0 in | Wt 158.0 lb

## 2017-01-03 DIAGNOSIS — Z86010 Personal history of colonic polyps: Secondary | ICD-10-CM | POA: Diagnosis present

## 2017-01-03 MED ORDER — SODIUM CHLORIDE 0.9 % IV SOLN: 500.0000 mL | INTRAVENOUS | Status: DC

## 2017-01-03 NOTE — Progress Notes (Signed)
Pt's states no medical or surgical changes since previsit or office visit. 

## 2017-01-03 NOTE — Progress Notes (Signed)
Spontaneous respirations throughout. VSS. Resting comfortably. To PACU on room air. Report to  Sara RN. 

## 2017-01-03 NOTE — Patient Instructions (Signed)
YOU HAD AN ENDOSCOPIC PROCEDURE TODAY AT THE [facility]:   Refer to the procedure report that was given to you for any specific questions about what was found during the examination.  If the procedure report does not answer your questions, please call your gastroenterologist to clarify.  If you requested that your care partner not be given the details of your procedure findings, then the procedure report has been included in a sealed envelope for you to review at your convenience later.  YOU SHOULD EXPECT: Some feelings of bloating in the abdomen. Passage of more gas than usual.  Walking can help get rid of the air that was put into your GI tract during the procedure and reduce the bloating. If you had a lower endoscopy (such as a colonoscopy or flexible sigmoidoscopy) you may notice spotting of blood in your stool or on the toilet paper. If you underwent a bowel prep for your procedure, you may not have a normal bowel movement for a few days.  Please Note:  You might notice some irritation and congestion in your nose or some drainage.  This is from the oxygen used during your procedure.  There is no need for concern and it should clear up in a day or so.  SYMPTOMS TO REPORT IMMEDIATELY:   Following lower endoscopy (colonoscopy or flexible sigmoidoscopy):  Excessive amounts of blood in the stool  Significant tenderness or worsening of abdominal pains  Swelling of the abdomen that is new, acute  Fever of 100F or higher   For urgent or emergent issues, a gastroenterologist can be reached at any hour by calling [phone]. Please read all handouts given to you by your recovery nurse.   DIET:  We do recommend a small meal at first, but then you may proceed to your regular diet.  Drink plenty of fluids but you should avoid alcoholic beverages for 24 hours.  ACTIVITY:  You should plan to take it easy for the rest of today and you should NOT DRIVE or use heavy machinery until  tomorrow (because of the sedation medicines used during the test).    FOLLOW UP: Our staff will call the number listed on your records the next business day following your procedure to check on you and address any questions or concerns that you may have regarding the information given to you following your procedure. If we do not reach you, we will leave a message.  However, if you are feeling well and you are not experiencing any problems, there is no need to return our call.  We will assume that you have returned to your regular daily activities without incident.  If any biopsies were taken you will be contacted by phone or by letter within the next 1-3 weeks.  Please call us at [phone] if you have not heard about the biopsies in 3 weeks.    SIGNATURES/CONFIDENTIALITY: You and/or your care partner have signed paperwork which will be entered into your electronic medical record.  These signatures attest to the fact that that the information above on your After Visit Summary has been reviewed and is understood.  Full responsibility of the confidentiality of this discharge information lies with you and/or your care-partner.  Thank you for letting us take care of your healthcare needs today. 

## 2017-01-03 NOTE — Op Note (Signed)
[facility] Patient Name: Jerry Rios Procedure Date: 01/03/2017 11:05 AM MRN: [id number] Endoscopist: Docia Chuck. Henrene Pastor , MD Age: 66 Referring MD:  Date of Birth: 16-Jan-1951 Gender: Male Account #: [id number] Procedure:                Colonoscopy Indications:              High risk colon cancer surveillance: Personal                            history of sessile serrated colon polyp (less than                            10 mm in size) with no dysplasia. Negative index                            exam 2001. 2007 with SSP. 2013 negative Medicines:                Monitored Anesthesia Care Procedure:                Pre-Anesthesia Assessment:                           - Prior to the procedure, a History and Physical                            was performed, and patient medications and                            allergies were reviewed. The patient's tolerance of                            previous anesthesia was also reviewed. The risks                            and benefits of the procedure and the sedation                            options and risks were discussed with the patient.                            All questions were answered, and informed consent                            was obtained. Prior Anticoagulants: The patient has                            taken no previous anticoagulant or antiplatelet                            agents. ASA Grade Assessment: II - A patient with                            mild systemic disease. After reviewing the risks  and benefits, the patient was deemed in                            satisfactory condition to undergo the procedure.                           After obtaining informed consent, the colonoscope                            was passed under direct vision. Throughout the                            procedure, the patient's blood pressure, pulse, and                            oxygen saturations were  monitored continuously. The                            Model CF-HQ190L [id number]) scope was introduced                            through the anus and advanced to the the cecum,                            identified by appendiceal orifice and ileocecal                            valve. The ileocecal valve, appendiceal orifice,                            and rectum were photographed. The quality of the                            bowel preparation was good. The colonoscopy was                            performed without difficulty. The patient tolerated                            the procedure well. The bowel preparation used was                            SUPREP. Scope In: 11:09:35 AM Scope Out: 11:24:01 AM Scope Withdrawal Time: 0 hours 12 minutes 33 seconds  Total Procedure Duration: 0 hours 14 minutes 26 seconds  Findings:                 Multiple small and large-mouthed diverticula were                            found in the left colon and right colon.                           Internal hemorrhoids were found during retroflexion.  The exam was otherwise without abnormality on                            direct and retroflexion views. Complications:            No immediate complications. Estimated blood loss:                            None. Estimated Blood Loss:     Estimated blood loss: none. Impression:               - Diverticulosis in the left colon and in the right                            colon.                           - Internal hemorrhoids.                           - The examination was otherwise normal on direct                            and retroflexion views.                           - No specimens collected. Recommendation:           - Repeat colonoscopy in 10 years for surveillance.                           - Patient has a contact number available for                            emergencies. The signs and symptoms of potential                             delayed complications were discussed with the                            patient. Return to normal activities tomorrow.                            Written discharge instructions were provided to the                            patient.                           - Resume previous diet.                           - Continue present medications. Docia Chuck. Henrene Pastor, MD 01/03/2017 11:30:21 AM This report has been signed electronically.

## 2017-01-04 ENCOUNTER — Telehealth: Payer: Self-pay | Admitting: *Deleted

## 2017-01-04 NOTE — Telephone Encounter (Signed)
  Follow up Call-  Call back number 01/03/2017  Post procedure Call Back phone  # [phone]  Permission to leave phone message Yes  Some recent data might be hidden     Patient questions:  Do you have a fever, pain , or abdominal swelling? No. Pain Score  0 *  Have you tolerated food without any problems? Yes.    Have you been able to return to your normal activities? Yes.    Do you have any questions about your discharge instructions: Diet   No. Medications  No. Follow up visit  No.  Do you have questions or concerns about your Care? No.  Actions: * If pain score is 4 or above: No action needed, pain <4.

## 2017-03-31 ENCOUNTER — Encounter: Payer: Self-pay | Admitting: Internal Medicine

## 2017-03-31 MED ORDER — METRONIDAZOLE 1 % EX GEL: 1.0000 "application " | Freq: Every day | CUTANEOUS | 1 refills | Status: DC | PRN

## 2017-06-07 ENCOUNTER — Encounter: Payer: Self-pay | Admitting: Internal Medicine

## 2017-06-07 MED ORDER — MAGIC MOUTHWASH: 5.0000 mL | Freq: Three times a day (TID) | ORAL | 1 refills | Status: DC | PRN

## 2017-06-19 ENCOUNTER — Encounter: Payer: Self-pay | Admitting: Internal Medicine

## 2017-06-19 NOTE — Telephone Encounter (Signed)
shirron to please contact pt regarding foot rash and ROV

## 2017-07-25 ENCOUNTER — Encounter: Payer: Self-pay | Admitting: Nurse Practitioner

## 2017-07-25 ENCOUNTER — Ambulatory Visit (INDEPENDENT_AMBULATORY_CARE_PROVIDER_SITE_OTHER): Payer: 59 | Admitting: Nurse Practitioner

## 2017-07-25 VITALS — BP 122/74 | HR 53 | Temp 97.8°F | Ht 67.0 in | Wt 151.0 lb

## 2017-07-25 DIAGNOSIS — S29012A Strain of muscle and tendon of back wall of thorax, initial encounter: Secondary | ICD-10-CM

## 2017-07-25 MED ORDER — SALINE SPRAY 0.65 % NA SOLN: 1.0000 | NASAL | 0 refills | Status: DC | PRN

## 2017-07-25 MED ORDER — METHOCARBAMOL 500 MG PO TABS: 500.0000 mg | ORAL_TABLET | Freq: Three times a day (TID) | ORAL | 0 refills | Status: DC | PRN

## 2017-07-25 MED ORDER — IPRATROPIUM BROMIDE 0.03 % NA SOLN: 2.0000 | Freq: Two times a day (BID) | NASAL | 0 refills | Status: DC

## 2017-07-25 MED ORDER — DM-GUAIFENESIN ER 30-600 MG PO TB12: 1.0000 | ORAL_TABLET | Freq: Two times a day (BID) | ORAL | 0 refills | Status: DC | PRN

## 2017-07-25 MED ORDER — BENZONATATE 100 MG PO CAPS: 100.0000 mg | ORAL_CAPSULE | Freq: Three times a day (TID) | ORAL | 0 refills | Status: DC | PRN

## 2017-07-25 NOTE — Patient Instructions (Addendum)
He declined any oral NSAID at this time.  Alternate between warm and cold compress as needed.  Use diclofenac gel as prescribed.  Do shoulder exercise once a day x 2weeks.  Please discuss need for lumber spine MRI if lower back pain persist (discuss with pcp).  Shoulder Range of Motion Exercises Shoulder range of motion (ROM) exercises are designed to keep the shoulder moving freely. They are often recommended for people who have shoulder pain. Phase 1 exercises When you are able, do this exercise 5-6 days per week, or as told by your health care provider. Work toward doing 2 sets of 10 swings. Pendulum Exercise How To Do This Exercise Lying Down 1. Lie face-down on a bed with your abdomen close to the side of the bed. 2. Let your arm hang over the side of the bed. 3. Relax your shoulder, arm, and hand. 4. Slowly and gently swing your arm forward and back. Do not use your neck muscles to swing your arm. They should be relaxed. If you are struggling to swing your arm, have someone gently swing it for you. When you do this exercise for the first time, swing your arm at a 15 degree angle for 15 seconds, or swing your arm 10 times. As pain lessens over time, increase the angle of the swing to 30-45 degrees. 5. Repeat steps 1-4 with the other arm.  How To Do This Exercise While Standing 1. Stand next to a sturdy chair or table and hold on to it with your hand. 1. Bend forward at the waist. 2. Bend your knees slightly. 3. Relax your other arm and let it hang limp. 4. Relax the shoulder blade of the arm that is hanging and let it drop. 5. While keeping your shoulder relaxed, use body motion to swing your arm in small circles. The first time you do this exercise, swing your arm for about 30 seconds or 10 times. When you do it next time, swing your arm for a little longer. 6. Stand up tall and relax. 7. Repeat steps 1-7, this time changing the direction of the circles. 2. Repeat steps 1-8 with  the other arm.  Phase 2 exercises Do these exercises 3-4 times per day on 5-6 days per week or as told by your health care provider. Work toward holding the stretch for 20 seconds. Stretching Exercise 1 1. Lift your arm straight out in front of you. 2. Bend your arm 90 degrees at the elbow (right angle) so your forearm goes across your body and looks like the letter "L." 3. Use your other arm to gently pull the elbow forward and across your body. 4. Repeat steps 1-3 with the other arm. Stretching Exercise 2 You will need a towel or rope for this exercise. 1. Bend one arm behind your back with the palm facing outward. 2. Hold a towel with your other hand. 3. Reach the arm that holds the towel above your head, and bend that arm at the elbow. Your wrist should be behind your neck. 4. Use your free hand to grab the free end of the towel. 5. With the higher hand, gently pull the towel up behind you. 6. With the lower hand, pull the towel down behind you. 7. Repeat steps 1-6 with the other arm.  Phase 3 exercises Do each of these exercises at four different times of day (sessions) every day or as told by your health care provider. To begin with, repeat each exercise 5 times (  repetitions). Work toward doing 3 sets of 12 repetitions or as told by your health care provider. Strengthening Exercise 1 You will need a light weight for this activity. As you grow stronger, you may use a heavier weight. 1. Standing with a weight in your hand, lift your arm straight out to the side until it is at the same height as your shoulder. 2. Bend your arm at 90 degrees so that your fingers are pointing to the ceiling. 3. Slowly raise your hand until your arm is straight up in the air. 4. Repeat steps 1-3 with the other arm.  Strengthening Exercise 2 You will need a light weight for this activity. As you grow stronger, you may use a heavier weight. 1. Standing with a weight in your hand, gradually move your  straight arm in an arc, starting at your side, then out in front of you, then straight up over your head. 2. Gradually move your other arm in an arc, starting at your side, then out in front of you, then straight up over your head. 3. Repeat steps 1-2 with the other arm.  Strengthening Exercise 3 You will need an elastic band for this activity. As you grow stronger, gradually increase the size of the bands or increase the number of bands that you use at one time. 1. While standing, hold an elastic band in one hand and raise that arm up in the air. 2. With your other hand, pull down the band until that hand is by your side. 3. Repeat steps 1-2 with the other arm.  This information is not intended to replace advice given to you by your health care provider. Make sure you discuss any questions you have with your health care provider. Document Released: 06/11/2003 Document Revised: 05/08/2016 Document Reviewed: 09/08/2014 Elsevier Interactive Patient Education  [employer].

## 2017-07-25 NOTE — Progress Notes (Signed)
Subjective:  Patient ID: Jerry Rios, male    DOB: 28-Dec-1950  Age: 66 y.o. MRN: [id number]  CC: Pain (pain left shoulder down to arm,aching,twitching--going on 1 day. med consult) and Sciatica (had x-ray done and getting more trouble--consult. )   Back Pain  This is a new problem. The current episode started yesterday. The problem occurs intermittently. The problem has been waxing and waning since onset. The pain is present in the thoracic spine. The quality of the pain is described as cramping. Radiates to: left arm. The pain is mild. The symptoms are aggravated by bending. Pertinent negatives include no abdominal pain, chest pain, fever, headaches, numbness, tingling or weakness. Risk factors include poor posture and sedentary lifestyle. He has tried nothing for the symptoms.   He also states he has hx of lower back pain with occasionally radiating down right thigh. Onset 1year ago, X-ray done 11/2016 indicates DDD. States he has used NSAID gel and back exercise but still get intermittent pain. Denies any new symptoms. Denies any injury. He will like to know what the next step is.  Outpatient Medications Prior to Visit  Medication Sig Dispense Refill  . aspirin 81 MG tablet Take 81 mg by mouth daily.    . bisacodyl (DULCOLAX) 5 MG EC tablet Take 5 mg by mouth daily as needed.    . cetirizine (ZYRTEC) 10 MG tablet Take 10 mg by mouth daily as needed for allergies.    . cholecalciferol (VITAMIN D) 1000 UNITS tablet Take 1,000 Units by mouth every morning.    Marland Kitchen co-enzyme Q-10 30 MG capsule Take 100 mg by mouth daily.    Marland Kitchen docusate sodium (COLACE) 100 MG capsule Take 100 mg by mouth daily as needed for mild constipation.    . fluticasone (FLONASE) 50 MCG/ACT nasal spray Place 2 sprays into both nostrils daily. 48 g 1  . magic mouthwash SOLN Take 5 mLs by mouth 3 (three) times daily as needed for mouth pain. 15 mL 1  . Melatonin 5 MG TABS Take 1 tablet by mouth at bedtime.     .  metroNIDAZOLE (METROGEL) 1 % gel Apply 1 application topically daily as needed (rosacea). 45 g 1  . Naphazoline-Pheniramine (OPCON-A OP) Apply 1 drop to eye daily.    Marland Kitchen OVER THE COUNTER MEDICATION Trace mineral supplement daily- 20 drops    . psyllium (METAMUCIL) 58.6 % powder Take 1 packet by mouth daily.    . rosuvastatin (CRESTOR) 20 MG tablet Take 1 tablet (20 mg total) by mouth daily. 90 tablet 3  . diclofenac sodium (VOLTAREN) 1 % GEL Apply 4 g topically 4 (four) times daily. (Patient not taking: Reported on 12/21/2016) 1 Tube 3  . hydrochlorothiazide (MICROZIDE) 12.5 MG capsule Take 1 capsule (12.5 mg total) by mouth daily. 90 capsule 3   Facility-Administered Medications Prior to Visit  Medication Dose Route Frequency Provider Last Rate Last Dose  . 0.9 %  sodium chloride infusion  500 mL Intravenous Continuous Irene Shipper, MD        ROS See HPI  Objective:  BP 122/74   Pulse (!) 53   Temp 97.8 F (36.6 C)   Ht 5\' 7"  (1.702 m)   Wt 151 lb (68.5 kg)   SpO2 99%   BMI 23.65 kg/m   BP Readings from Last 3 Encounters:  07/25/17 122/74  01/03/17 (!) 90/54  12/14/16 104/68    Wt Readings from Last 3 Encounters:  07/25/17 151 lb (68.5  kg)  01/03/17 158 lb (71.7 kg)  12/21/16 158 lb (71.7 kg)    Physical Exam  Constitutional: He is oriented to person, place, and time. No distress.  Cardiovascular: Normal rate and regular rhythm.   Pulmonary/Chest: Effort normal and breath sounds normal. No respiratory distress. He exhibits no tenderness.  Abdominal: Soft.  Musculoskeletal: Normal range of motion. He exhibits tenderness. He exhibits no edema or deformity.       Right shoulder: Normal.       Left shoulder: Normal.       Cervical back: Normal.       Back:  Neurological: He is alert and oriented to person, place, and time.  Skin: Skin is warm and dry. No rash noted. No erythema.  Vitals reviewed.   Lab Results  Component Value Date   WBC 6.2 12/26/2016   HGB 14.8  12/26/2016   HCT 42.5 12/26/2016   PLT 219.0 12/26/2016   GLUCOSE 107 (H) 12/26/2016   CHOL 262 (H) 12/26/2016   TRIG 64.0 12/26/2016   HDL 90.60 12/26/2016   LDLDIRECT 206.1 11/01/2013   LDLCALC 158 (H) 12/26/2016   ALT 14 12/26/2016   AST 21 12/26/2016   NA 140 12/26/2016   K 4.0 12/26/2016   CL 102 12/26/2016   CREATININE 1.28 12/26/2016   BUN 13 12/26/2016   CO2 31 12/26/2016   TSH 2.30 12/26/2016   PSA 1.35 12/26/2016   HGBA1C 5.6 12/23/2014    US Thyroid  Result Date: 12/20/2016 CLINICAL DATA:  66 year old male with a history of thyroid goiter. Prior biopsy performed 12/02/2014 of left thyroid lobe and right thyroid lobe. EXAM: THYROID ULTRASOUND TECHNIQUE: Ultrasound examination of the thyroid gland and adjacent soft tissues was performed. COMPARISON:  12/02/2014, 11/27/2014 FINDINGS: Parenchymal Echotexture: Mildly heterogenous Isthmus: 0.3 cm Right lobe: 5.3 cm x 1.6 cm x 2.3 cm Left lobe: 6.0 cm x 2.2 cm x 2.1 cm _________________________________________________________ Estimated total number of nodules >/= 1 cm: 4 Number of spongiform nodules >/=  2 cm not described below (TR1): 0 Number of mixed cystic and solid nodules >/= 1.5 cm not described below ([place]): 0 _________________________________________________________ Nodule # 1: Location: Right; Superior Maximum size: 1.0 cm; Other 2 dimensions: 0.7 cm x 1.0 cm Composition: solid/almost completely solid (2) Echogenicity: isoechoic (1) Shape: not taller-than-wide (0) Margins: smooth (0) Echogenic foci: none (0) ACR TI-RADS total points: 3. ACR TI-RADS risk category: TR3 (3 points). ACR TI-RADS recommendations: Nodule does not meet criteria for surveillance or biopsy _________________________________________________________ Nodule # 2: Location: Right; Inferior Maximum size: 2.6 cm; Other 2 dimensions: 1.4 cm x 2.0 cm. Nodule measures slightly smaller than previous, with largest measurement 2.7 cm previous Composition: solid/almost  completely solid (2) Echogenicity: isoechoic (1) Shape: not taller-than-wide (0) Margins: ill-defined (0) Echogenic foci: punctate echogenic foci (3) ACR TI-RADS total points: 6. ACR TI-RADS risk category: TR4 (4-6 points). ACR TI-RADS recommendations: This nodule has been previously biopsied, meeting criteria _________________________________________________________ Nodule # 1: Location: Left; Superior Maximum size: 1.1 cm; Other 2 dimensions: 0.7 cm x 0.8 cm Composition: solid/almost completely solid (2) Echogenicity: hypoechoic (2) Shape: not taller-than-wide (0) Margins: ill-defined (0) Echogenic foci: none (0) ACR TI-RADS total points: 4. ACR TI-RADS risk category: TR4 (4-6 points). ACR TI-RADS recommendations: Nodule meets criteria for surveillance _________________________________________________________ Nodule # 2: Location: Left; Inferior Maximum size: 3.7 cm; Other 2 dimensions: 1.9 cm x 3.1 cm. Nodule measures smaller than the comparison, with the largest measurement 3.9 cm previously Composition: solid/almost completely solid (2)  Echogenicity: isoechoic (1) Shape: not taller-than-wide (0) Margins: ill-defined (0) Echogenic foci: none (0) ACR TI-RADS total points: 3. ACR TI-RADS risk category: TR3 (3 points). ACR TI-RADS recommendations: Nodule has been previously biopsied, meeting criteria. _________________________________________________________ IMPRESSION: Multinodular thyroid, with the largest right and largest left nodules having previously been biopsied. Recommend correlation with prior biopsy result. Both of these nodules exhibit benign behavior, measuring smaller than the comparison. Left superior thyroid nodule (labeled 1) meets criteria for surveillance, as designated by the newly established ACR TI-RADS criteria. Surveillance ultrasound study recommended to be performed annually up to 5 years. Baseline ultrasound 11/27/2014. Recommendations follow those established by the new ACR TI-RADS  criteria (J Am Coll Radiol 6962;95:284-132). Signed, Dulcy Fanny. Earleen Newport, DO [facility] Radiology Electronically Signed   By: Corrie Mckusick D.O.   On: 12/20/2016 15:43    Assessment & Plan:   Braxtin was seen today for pain and sciatica.  Diagnoses and all orders for this visit:  Rhomboid muscle strain, initial encounter -     Discontinue: ipratropium (ATROVENT) 0.03 % nasal spray; Place 2 sprays into both nostrils 2 (two) times daily. Do not use for more than 5days. -     Discontinue: sodium chloride (OCEAN) 0.65 % SOLN nasal spray; Place 1 spray into both nostrils as needed for congestion. -     Discontinue: benzonatate (TESSALON) 100 MG capsule; Take 1 capsule (100 mg total) by mouth 3 (three) times daily as needed for cough. -     Discontinue: dextromethorphan-guaiFENesin (MUCINEX DM) 30-600 MG 12hr tablet; Take 1 tablet by mouth 2 (two) times daily as needed for cough. -     methocarbamol (ROBAXIN) 500 MG tablet; Take 1 tablet (500 mg total) by mouth every 8 (eight) hours as needed for muscle spasms.   I have discontinued Mr. Verge ipratropium, sodium chloride, benzonatate, and dextromethorphan-guaiFENesin. I am also having him start on methocarbamol. Additionally, I am having him maintain his bisacodyl, Melatonin, cholecalciferol, cetirizine, Naphazoline-Pheniramine (OPCON-A OP), OVER THE COUNTER MEDICATION, co-enzyme Q-10, psyllium, docusate sodium, aspirin, diclofenac sodium, rosuvastatin, hydrochlorothiazide, fluticasone, metroNIDAZOLE, and magic mouthwash. We will continue to administer sodium chloride.  Meds ordered this encounter  Medications  . DISCONTD: ipratropium (ATROVENT) 0.03 % nasal spray    Sig: Place 2 sprays into both nostrils 2 (two) times daily. Do not use for more than 5days.    Dispense:  30 mL    Refill:  0    Order Specific Question:   Supervising Provider    Answer:   Cassandria Anger [[id number]]  . DISCONTD:  sodium chloride (OCEAN) 0.65 % SOLN nasal spray    Sig: Place 1 spray into both nostrils as needed for congestion.    Dispense:  15 mL    Refill:  0    Order Specific Question:   Supervising Provider    Answer:   Cassandria Anger [[id number]]  . DISCONTD: benzonatate (TESSALON) 100 MG capsule    Sig: Take 1 capsule (100 mg total) by mouth 3 (three) times daily as needed for cough.    Dispense:  20 capsule    Refill:  0    Order Specific Question:   Supervising Provider    Answer:   Cassandria Anger [[id number]]  . DISCONTD: dextromethorphan-guaiFENesin (MUCINEX DM) 30-600 MG 12hr tablet    Sig: Take 1 tablet by mouth 2 (two) times daily as needed for cough.    Dispense:  14 tablet    Refill:  0  Order Specific Question:   Supervising Provider    Answer:   Cassandria Anger [[id number]]  . methocarbamol (ROBAXIN) 500 MG tablet    Sig: Take 1 tablet (500 mg total) by mouth every 8 (eight) hours as needed for muscle spasms.    Dispense:  21 tablet    Refill:  0    Order Specific Question:   Supervising Provider    Answer:   Cassandria Anger [[id number]]    Follow-up: Return if symptoms worsen or fail to improve.  Wilfred Lacy, NP

## 2017-07-26 ENCOUNTER — Encounter: Payer: Self-pay | Admitting: Nurse Practitioner

## 2017-07-26 DIAGNOSIS — S29012A Strain of muscle and tendon of back wall of thorax, initial encounter: Secondary | ICD-10-CM

## 2017-07-26 MED ORDER — NAPROXEN 500 MG PO TABS: 500.0000 mg | ORAL_TABLET | Freq: Two times a day (BID) | ORAL | 0 refills | Status: DC | PRN

## 2017-07-27 ENCOUNTER — Telehealth: Payer: Self-pay | Admitting: Nurse Practitioner

## 2017-07-27 DIAGNOSIS — M5412 Radiculopathy, cervical region: Secondary | ICD-10-CM

## 2017-07-27 NOTE — Telephone Encounter (Signed)
Please see email

## 2017-07-27 NOTE — Telephone Encounter (Signed)
Sent pt massage through [employer].

## 2017-07-27 NOTE — Telephone Encounter (Signed)
What is this about?

## 2017-07-28 ENCOUNTER — Ambulatory Visit (INDEPENDENT_AMBULATORY_CARE_PROVIDER_SITE_OTHER)
Admission: RE | Admit: 2017-07-28 | Discharge: 2017-07-28 | Disposition: A | Discharge status: Home / Self Care | Payer: 59 | Source: Ambulatory Visit | Attending: Nurse Practitioner | Admitting: Nurse Practitioner

## 2017-07-28 DIAGNOSIS — M5412 Radiculopathy, cervical region: Secondary | ICD-10-CM | POA: Diagnosis not present

## 2017-08-01 ENCOUNTER — Encounter: Payer: Self-pay | Admitting: Family Medicine

## 2017-08-01 ENCOUNTER — Ambulatory Visit: Payer: 59 | Admitting: Family Medicine

## 2017-08-01 VITALS — BP 132/68 | HR 68 | Temp 97.8°F | Ht 67.0 in | Wt 153.0 lb

## 2017-08-01 DIAGNOSIS — M5412 Radiculopathy, cervical region: Secondary | ICD-10-CM | POA: Insufficient documentation

## 2017-08-01 HISTORY — DX: Radiculopathy, cervical region: M54.12

## 2017-08-01 MED ORDER — MELOXICAM 15 MG PO TABS: 15.0000 mg | ORAL_TABLET | Freq: Every day | ORAL | 0 refills | Status: DC

## 2017-08-01 MED ORDER — GABAPENTIN 100 MG PO CAPS: 100.0000 mg | ORAL_CAPSULE | Freq: Three times a day (TID) | ORAL | 3 refills | Status: DC

## 2017-08-01 NOTE — Progress Notes (Signed)
Jerry Rios - 66 y.o. male MRN [id number]  Date of birth: 12/19/1950  SUBJECTIVE:  Including CC & ROS.  Chief Complaint  Patient presents with  . Left shoulder pain/Neck    He states the pain has been present for one week. Feels like a pinched nerve-pain extends downward to his left hand and feels numb at times. Denies injury. He started seeing a chiropractor on 07/27/17.     Jerry Rios is a 66 y.o. male that is left sided radicular type symptoms. Also having pain in his left trapezius. Pain has been ongoing for about a week. He is having some pain on the posterior aspect of his forearm and some numbness in his hand. He denies any injury or inciting event. He has been to the chiropractor and has had some improvement of his symptoms. The symptoms seem to be staying the same. The pain is mild to moderate in nature. The pain is worse with certain movements such as bending over or turning his head a certain direction. He denies any fevers or chills. His work includes working on a laptop which also seems to exacerbate his problems.  Independent review of the cervical spine x-ray from 11/2 shows degenerative changes at C5-C6   Review of Systems  Constitutional: Negative for fever.  Respiratory: Negative for shortness of breath.   Musculoskeletal: Positive for neck pain. Negative for back pain and gait problem.  Skin: Negative for color change.  Neurological: Positive for numbness. Negative for weakness.  Hematological: Negative for adenopathy.    HISTORY: Past Medical, Surgical, Social, and Family History Reviewed & Updated per EMR.   Pertinent Historical Findings include:  Past Medical History:  Diagnosis Date  . Allergy   . Arthritis    lumbar spine  . BPH (benign prostatic hypertrophy)   . Cataract    starting- mild   . Diverticulosis of colon   . History of nephrolithiasis   . History of shingles   . Hx of colonic polyp   . Hyperlipidemia   . Hypertension    some past  history   . Insomnia   . Libido, decreased 10/04/2011  . Stroke ([place]) 10/2014  . TIA (transient ischemic attack)   . Vitiligo 10/31/2012    Past Surgical History:  Procedure Laterality Date  . COLONOSCOPY    . lithotrypsy     x 3  . POLYPECTOMY    . TONSILLECTOMY      Allergies  Allergen Reactions  . Atorvastatin     REACTION: myalgia  . Rosuvastatin     REACTION: myalgia  . Simvastatin     REACTION: myalgia  . Adhesive [Tape] Rash    Family History  Problem Relation Age of Onset  . Ulcerative colitis Sister   . Colitis Sister   . Heart disease Other        Grandparents  . Hyperlipidemia Other   . Hypertension Other   . Heart failure Mother   . Heart failure Father   . Lupus Paternal Grandfather   . Heart failure Maternal Grandfather   . Colon cancer Neg Hx   . Esophageal cancer Neg Hx   . Pancreatic cancer Neg Hx   . Stomach cancer Neg Hx   . Liver disease Neg Hx   . Colon polyps Neg Hx   . Rectal cancer Neg Hx      Social History   Socioeconomic History  . Marital status: Single    Spouse name: Not on file  .  Number of children: Not on file  . Years of education: Not on file  . Highest education level: Not on file  Social Needs  . Financial resource strain: Not on file  . Food insecurity - worry: Not on file  . Food insecurity - inability: Not on file  . Transportation needs - medical: Not on file  . Transportation needs - non-medical: Not on file  Occupational History  . Occupation: IT  Tobacco Use  . Smoking status: Former Research scientist (life sciences)  . Smokeless tobacco: Never Used  . Tobacco comment: Regular exercise - No  Substance and Sexual Activity  . Alcohol use: Yes    Alcohol/week: 4.2 oz    Types: 7 Cans of beer per week  . Drug use: No  . Sexual activity: No  Other Topics Concern  . Not on file  Social History Narrative   Works at [employer], divorce, no biological children     PHYSICAL EXAM:  VS: BP 132/68 (BP Location: Left Arm, Patient  Position: Sitting, Cuff Size: Normal)   Pulse 68   Temp 97.8 F (36.6 C) (Oral)   Ht 5\' 7"  (1.702 m)   Wt 153 lb (69.4 kg)   SpO2 99%   BMI 23.96 kg/m  Physical Exam Gen: NAD, alert, cooperative with exam, well-appearing ENT: normal lips, normal nasal mucosa,  Eye: normal EOM, normal conjunctiva and lids CV:  no edema, +2 pedal pulses   Resp: no accessory muscle use, non-labored,  Skin: no rashes, no areas of induration  Neuro: normal tone, normal sensation to touch Psych:  normal insight, alert and oriented MSK:  Neck: No significant tender to palpation over the midline cervical spine. No significant tears to palpation over the cervical spinal muscles. Some tenderness to palpation of the left trapezius. Normal shoulder range of motion. Normal strength in the upper extremity. Normal pincer grasp. Normal strength with resistance at the wrist and extension and flexion. Normal finger abduction and adduction strength resistance. No signs of atrophy. Neurovascularly intact.      ASSESSMENT & PLAN:   Cervical radiculopathy His symptoms seem to be associated with a cervical radiculopathy. Does not appear to be associated with his shoulder as the origin. - Initiate meloxicam and gabapentin. He wants to avoid steroids for now. May need to titrate the gabapentin - If no improvement can consider physical therapy. Advised to follow-up in 4-6 weeks. Could consider MRI of the cervical spine for epidural treatment. -

## 2017-08-01 NOTE — Patient Instructions (Addendum)
Thank you for coming in,   Please take the mobic for 14 days straight and then as needed.   Please try the neck exercises.   Please follow up with me if you don't have any improvement.    Please feel free to call with any questions or concerns at any time, at [phone]. --Dr. Raeford Razor

## 2017-08-01 NOTE — Assessment & Plan Note (Signed)
His symptoms seem to be associated with a cervical radiculopathy. Does not appear to be associated with his shoulder as the origin. - Initiate meloxicam and gabapentin. He wants to avoid steroids for now. May need to titrate the gabapentin - If no improvement can consider physical therapy. Advised to follow-up in 4-6 weeks. Could consider MRI of the cervical spine for epidural treatment. -

## 2017-09-13 ENCOUNTER — Encounter: Payer: Self-pay | Admitting: Family Medicine

## 2017-10-17 ENCOUNTER — Telehealth: Payer: Self-pay

## 2017-10-17 ENCOUNTER — Other Ambulatory Visit: Payer: Self-pay | Admitting: Internal Medicine

## 2017-10-17 ENCOUNTER — Encounter: Payer: Self-pay | Admitting: Internal Medicine

## 2017-10-17 MED ORDER — HYDROCHLOROTHIAZIDE 12.5 MG PO CAPS: 12.5000 mg | ORAL_CAPSULE | Freq: Every day | ORAL | 0 refills | Status: DC

## 2017-10-17 MED ORDER — LOSARTAN POTASSIUM-HCTZ 100-12.5 MG PO TABS
1.0000 | ORAL_TABLET | Freq: Every day | ORAL | 3 refills | Status: DC
Start: 2017-10-17 — End: 2018-10-04

## 2017-10-17 NOTE — Telephone Encounter (Signed)
Informed pt that form came from [facility].

## 2017-10-17 NOTE — Telephone Encounter (Signed)
Called pt, LVM informing him we received a surgery clearance form and JJ would like for him to come in for an OV to discuss.

## 2017-10-17 NOTE — Telephone Encounter (Signed)
Ok to let pt know, he should ADD the new losartan HCT 100-12.5 mg daily  He can continue the HCT 12.5 mg as well (this can be changed in the future to make all this in one pill if works out ok)  Pt should check BP at least daily at different times of the day and call in 1 week with results  I see he has an appt for Feb 1 as well, so this will be good to check as well

## 2017-10-17 NOTE — Telephone Encounter (Addendum)
°  Relation to pt: self  Call back number:  [phone]   Reason for call:  Patient scheduled with Dr. Cathlean Cower for 10/27/2017. Patient was inquiring which office did the forms come from due to 2 upcoming surgeries, please advise

## 2017-10-25 ENCOUNTER — Encounter: Payer: Self-pay | Admitting: Internal Medicine

## 2017-10-27 ENCOUNTER — Encounter: Payer: Self-pay | Admitting: Internal Medicine

## 2017-10-27 ENCOUNTER — Ambulatory Visit: Payer: 59 | Admitting: Internal Medicine

## 2017-10-27 VITALS — BP 118/78 | HR 71 | Temp 97.6°F | Ht 67.0 in | Wt 151.0 lb

## 2017-10-27 DIAGNOSIS — F329 Major depressive disorder, single episode, unspecified: Secondary | ICD-10-CM | POA: Diagnosis not present

## 2017-10-27 DIAGNOSIS — M5412 Radiculopathy, cervical region: Secondary | ICD-10-CM

## 2017-10-27 DIAGNOSIS — E785 Hyperlipidemia, unspecified: Secondary | ICD-10-CM

## 2017-10-27 DIAGNOSIS — F32A Depression, unspecified: Secondary | ICD-10-CM

## 2017-10-27 DIAGNOSIS — I10 Essential (primary) hypertension: Secondary | ICD-10-CM | POA: Diagnosis not present

## 2017-10-27 NOTE — Patient Instructions (Signed)
OK for [place] as planned per Dr Army Melia  Please continue all other medications as before, and refills have been done if requested.  Please have the pharmacy call with any other refills you may need.  Please keep your appointments with your specialists as you may have planned

## 2017-10-27 NOTE — Progress Notes (Addendum)
Subjective:    Patient ID: Jerry Rios, male    DOB: May 17, 1951, 67 y.o.   MRN: [id number]  HPI  Here to f/u; due for cervical surgury soon, overall doing ok,  Pt denies chest pain, increasing sob or doe, wheezing, orthopnea, PND, increased LE swelling, palpitations, dizziness or syncope.  Pt denies new neurological symptoms such as new headache, or facial or extremity weakness or numbness.  Pt denies polydipsia, polyuria, or low sugar episode.  Pt states overall good compliance with meds, mostly trying to follow appropriate diet, with wt overall stable,  but little exercise however. Denies worsening depressive symptoms, suicidal ideation, or panic Wt Readings from Last 3 Encounters:  10/27/17 151 lb (68.5 kg)  08/01/17 153 lb (69.4 kg)  07/25/17 151 lb (68.5 kg)   BP Readings from Last 3 Encounters:  10/27/17 118/78  08/01/17 132/68  07/25/17 122/74  States not taking the losartan HCT as BP improved, then stayed down recently.  No new complaints or interval hx Past Medical History:  Diagnosis Date  . Allergy   . Arthritis    lumbar spine  . BPH (benign prostatic hypertrophy)   . Cataract    starting- mild   . Diverticulosis of colon   . History of nephrolithiasis   . History of shingles   . Hx of colonic polyp   . Hyperlipidemia   . Hypertension    some past history   . Insomnia   . Libido, decreased 10/04/2011  . Stroke ([place]) 10/2014  . TIA (transient ischemic attack)   . Vitiligo 10/31/2012   Past Surgical History:  Procedure Laterality Date  . COLONOSCOPY    . CYSTOSCOPY WITH URETEROSCOPY AND STENT PLACEMENT Left 07/04/2014   Procedure: CYSTOSCOPY WITH LEFT RETROGRADE PYLEOGRAM, LEFT URETERAL DILATION AND URETEROSCOPY, LASER LITHOTRISPY AND STENT PLACEMENT;  Surgeon: [name], MD;  Location: [facility];  Service: Urology;  Laterality: Left;  . HOLMIUM LASER APPLICATION N/A 34/03/4258   Procedure:  LASER LITHOTRIPSY;  Surgeon: [name], MD;  Location:  [facility];  Service: Urology;  Laterality: N/A;  . lithotrypsy     x 3  . POLYPECTOMY    . TONSILLECTOMY      reports that he has quit smoking. he has never used smokeless tobacco. He reports that he drinks about 4.2 oz of alcohol per week. He reports that he does not use drugs. family history includes Colitis in his sister; Heart disease in his other; Heart failure in his father, maternal grandfather, and mother; Hyperlipidemia in his other; Hypertension in his other; Lupus in his paternal grandfather; Ulcerative colitis in his sister. Allergies  Allergen Reactions  . Atorvastatin     REACTION: myalgia  . Rosuvastatin     REACTION: myalgia  . Simvastatin     REACTION: myalgia  . Adhesive [Tape] Rash   Current Outpatient Medications on File Prior to Visit  Medication Sig Dispense Refill  . aspirin 81 MG tablet Take 81 mg by mouth daily.    . bisacodyl (DULCOLAX) 5 MG EC tablet Take 5 mg by mouth daily as needed.    . cetirizine (ZYRTEC) 10 MG tablet Take 10 mg by mouth daily as needed for allergies.    . cholecalciferol (VITAMIN D) 1000 UNITS tablet Take 1,000 Units by mouth every morning.    Marland Kitchen co-enzyme Q-10 30 MG capsule Take 100 mg by mouth daily.    . diclofenac sodium (VOLTAREN) 1 % GEL Apply 4 g topically 4 (  four) times daily. 1 Tube 3  . docusate sodium (COLACE) 100 MG capsule Take 100 mg by mouth daily as needed for mild constipation.    . fluticasone (FLONASE) 50 MCG/ACT nasal spray Place 2 sprays into both nostrils daily. 48 g 1  . gabapentin (NEURONTIN) 100 MG capsule Take 1 capsule (100 mg total) 3 (three) times daily by mouth. 90 capsule 3  . hydrochlorothiazide (MICROZIDE) 12.5 MG capsule Take 1 capsule (12.5 mg total) by mouth daily. **PATIENT NEEDS A PHYSICAL FOR ADDITIONAL REFILLS** 90 capsule 0  . losartan-hydrochlorothiazide (HYZAAR) 100-12.5 MG tablet Take 1 tablet by mouth daily. 90 tablet 3  . magic mouthwash SOLN Take 5 mLs by mouth 3 (three) times daily as needed  for mouth pain. 15 mL 1  . Melatonin 5 MG TABS Take 1 tablet by mouth at bedtime.     . meloxicam (MOBIC) 15 MG tablet Take 1 tablet (15 mg total) daily by mouth. 30 tablet 0  . metroNIDAZOLE (METROGEL) 1 % gel Apply 1 application topically daily as needed (rosacea). 45 g 1  . Naphazoline-Pheniramine (OPCON-A OP) Apply 1 drop to eye daily.    . naproxen (NAPROSYN) 500 MG tablet Take 1 tablet (500 mg total) by mouth 2 (two) times daily as needed (for pain, take with food). 30 tablet 0  . OVER THE COUNTER MEDICATION Trace mineral supplement daily- 20 drops    . psyllium (METAMUCIL) 58.6 % powder Take 1 packet by mouth daily.    . rosuvastatin (CRESTOR) 20 MG tablet Take 1 tablet (20 mg total) by mouth daily. 90 tablet 3   No current facility-administered medications on file prior to visit.    Review of Systems  Constitutional: Negative for other unusual diaphoresis or sweats HENT: Negative for ear discharge or swelling Eyes: Negative for other worsening visual disturbances Respiratory: Negative for stridor or other swelling  Gastrointestinal: Negative for worsening distension or other blood Genitourinary: Negative for retention or other urinary change Musculoskeletal: Negative for other MSK pain or swelling Skin: Negative for color change or other new lesions Neurological: Negative for worsening tremors and other numbness  Psychiatric/Behavioral: Negative for worsening agitation or other fatigue All other system neg per pt    Objective:   Physical Exam BP 118/78   Pulse 71   Temp 97.6 F (36.4 C) (Oral)   Ht 5\' 7"  (1.702 m)   Wt 151 lb (68.5 kg)   SpO2 98%   BMI 23.65 kg/m  VS noted,  Constitutional: Pt appears in NAD HENT: Head: NCAT.  Right Ear: External ear normal.  Left Ear: External ear normal.  Eyes: . Pupils are equal, round, and reactive to light. Conjunctivae and EOM are normal Nose: without d/c or deformity Neck: Neck supple. Gross normal ROM Cardiovascular: Normal  rate and regular rhythm.   Pulmonary/Chest: Effort normal and breath sounds without rales or wheezing.  Neurological: Pt is alert. At baseline orientation, o/w not done in detail Skin: Skin is warm. No rashes, other new lesions, no LE edema Psychiatric: Pt behavior is normal without agitation , not depressed affect All other exam findings       Assessment & Plan:

## 2017-10-28 NOTE — Assessment & Plan Note (Signed)
stable overall by history and exam, recent data reviewed with pt, has been statin intolerant, and pt to continue medical treatment as before,  to f/u any worsening symptoms or concerns

## 2017-10-28 NOTE — Assessment & Plan Note (Signed)
stable overall by history and exam, recent data reviewed with pt, and pt to continue medical treatment as before,  to f/u any worsening symptoms or concerns  

## 2017-10-28 NOTE — Assessment & Plan Note (Signed)
[place] for [employer], form to be signed

## 2017-12-03 ENCOUNTER — Encounter: Payer: Self-pay | Admitting: Internal Medicine

## 2017-12-04 MED ORDER — ROSUVASTATIN CALCIUM 20 MG PO TABS: 20.0000 mg | ORAL_TABLET | Freq: Every day | ORAL | 0 refills | Status: DC

## 2017-12-08 HISTORY — PX: CERVICAL DISC ARTHROPLASTY: SHX587

## 2017-12-20 ENCOUNTER — Encounter: Payer: Self-pay | Admitting: Internal Medicine

## 2017-12-20 MED ORDER — FLUTICASONE PROPIONATE 50 MCG/ACT NA SUSP: 2.0000 | Freq: Every day | NASAL | 1 refills | Status: DC

## 2017-12-20 NOTE — Addendum Note (Signed)
Addended by: Earnstine Regal on: 12/20/2017 03:17 PM   Modules accepted: Orders

## 2017-12-25 ENCOUNTER — Other Ambulatory Visit: Payer: Self-pay | Admitting: Neurological Surgery

## 2017-12-25 DIAGNOSIS — M5416 Radiculopathy, lumbar region: Secondary | ICD-10-CM

## 2018-01-04 ENCOUNTER — Other Ambulatory Visit: Payer: 59

## 2018-01-08 ENCOUNTER — Ambulatory Visit
Admission: RE | Admit: 2018-01-08 | Discharge: 2018-01-08 | Disposition: A | Discharge status: Home / Self Care | Payer: 59 | Source: Ambulatory Visit | Attending: Neurological Surgery | Admitting: Neurological Surgery

## 2018-01-08 DIAGNOSIS — M5416 Radiculopathy, lumbar region: Secondary | ICD-10-CM

## 2018-01-22 ENCOUNTER — Other Ambulatory Visit: Payer: Self-pay | Admitting: Internal Medicine

## 2018-08-09 LAB — HM DIABETES EYE EXAM

## 2018-09-30 ENCOUNTER — Encounter: Payer: Self-pay | Admitting: Internal Medicine

## 2018-09-30 DIAGNOSIS — Z0000 Encounter for general adult medical examination without abnormal findings: Secondary | ICD-10-CM

## 2018-10-01 ENCOUNTER — Other Ambulatory Visit: Payer: Self-pay | Admitting: Internal Medicine

## 2018-10-01 ENCOUNTER — Other Ambulatory Visit (INDEPENDENT_AMBULATORY_CARE_PROVIDER_SITE_OTHER): Payer: 59

## 2018-10-01 DIAGNOSIS — K638219 Small intestinal bacterial overgrowth, unspecified: Secondary | ICD-10-CM | POA: Insufficient documentation

## 2018-10-01 DIAGNOSIS — Z0000 Encounter for general adult medical examination without abnormal findings: Secondary | ICD-10-CM

## 2018-10-01 DIAGNOSIS — K6389 Other specified diseases of intestine: Secondary | ICD-10-CM | POA: Insufficient documentation

## 2018-10-01 LAB — LIPID PANEL
Cholesterol: 229 mg/dL — ABNORMAL HIGH (ref 0–200)
HDL: 89.8 mg/dL (ref >39.00)
LDL Cholesterol: 125 mg/dL — ABNORMAL HIGH (ref 0–99)
NonHDL: 138.85
TRIGLYCERIDES: 68 mg/dL (ref 0.0–149.0)
Total CHOL/HDL Ratio: 3
VLDL: 13.6 mg/dL (ref 0.0–40.0)

## 2018-10-01 LAB — CBC WITH DIFFERENTIAL/PLATELET
Basophils Absolute: 0 K/uL (ref 0.0–0.1)
Basophils Relative: 0.7 % (ref 0.0–3.0)
Eosinophils Absolute: 0.1 K/uL (ref 0.0–0.7)
Eosinophils Relative: 1 % (ref 0.0–5.0)
HCT: 41.7 % (ref 39.0–52.0)
Hemoglobin: 14.4 g/dL (ref 13.0–17.0)
Lymphocytes Relative: 35.2 % (ref 12.0–46.0)
Lymphs Abs: 2.1 K/uL (ref 0.7–4.0)
MCHC: 34.5 g/dL (ref 30.0–36.0)
MCV: 91.7 fl (ref 78.0–100.0)
Monocytes Absolute: 0.5 K/uL (ref 0.1–1.0)
Monocytes Relative: 8 % (ref 3.0–12.0)
Neutro Abs: 3.3 K/uL (ref 1.4–7.7)
Neutrophils Relative %: 55.1 % (ref 43.0–77.0)
Platelets: 223 K/uL (ref 150.0–400.0)
RBC: 4.55 Mil/uL (ref 4.22–5.81)
RDW: 14.3 % (ref 11.5–15.5)
WBC: 6.1 K/uL (ref 4.0–10.5)

## 2018-10-01 LAB — PSA: PSA: 1.45 ng/mL (ref 0.10–4.00)

## 2018-10-01 LAB — URINALYSIS, ROUTINE W REFLEX MICROSCOPIC
Bilirubin Urine: NEGATIVE
Hgb urine dipstick: NEGATIVE
Ketones, ur: NEGATIVE
Leukocytes, UA: NEGATIVE
Nitrite: NEGATIVE
Specific Gravity, Urine: 1.015 (ref 1.000–1.030)
Total Protein, Urine: NEGATIVE
Urine Glucose: NEGATIVE
Urobilinogen, UA: 0.2 (ref 0.0–1.0)
pH: 6 (ref 5.0–8.0)

## 2018-10-01 LAB — HEPATIC FUNCTION PANEL
ALT: 13 U/L (ref 0–53)
AST: 19 U/L (ref 0–37)
Albumin: 4.6 g/dL (ref 3.5–5.2)
Alkaline Phosphatase: 33 U/L — ABNORMAL LOW (ref 39–117)
Bilirubin, Direct: 0.1 mg/dL (ref 0.0–0.3)
Total Bilirubin: 0.6 mg/dL (ref 0.2–1.2)
Total Protein: 6.8 g/dL (ref 6.0–8.3)

## 2018-10-01 LAB — BASIC METABOLIC PANEL
BUN: 16 mg/dL (ref 6–23)
CO2: 30 mEq/L (ref 19–32)
Calcium: 9.8 mg/dL (ref 8.4–10.5)
Chloride: 102 mEq/L (ref 96–112)
Creatinine, Ser: 1.22 mg/dL (ref 0.40–1.50)
GFR: 62.76 mL/min (ref >60.00)
GLUCOSE: 101 mg/dL — AB (ref 70–99)
Potassium: 4 mEq/L (ref 3.5–5.1)
Sodium: 141 mEq/L (ref 135–145)

## 2018-10-01 LAB — TSH: TSH: 1.88 u[IU]/mL (ref 0.35–4.50)

## 2018-10-04 ENCOUNTER — Ambulatory Visit (INDEPENDENT_AMBULATORY_CARE_PROVIDER_SITE_OTHER): Payer: 59 | Admitting: Internal Medicine

## 2018-10-04 ENCOUNTER — Encounter: Payer: Self-pay | Admitting: Internal Medicine

## 2018-10-04 VITALS — BP 112/68 | HR 55 | Temp 97.7°F | Ht 67.0 in | Wt 148.0 lb

## 2018-10-04 DIAGNOSIS — Z0001 Encounter for general adult medical examination with abnormal findings: Secondary | ICD-10-CM | POA: Diagnosis not present

## 2018-10-04 DIAGNOSIS — Z23 Encounter for immunization: Secondary | ICD-10-CM | POA: Diagnosis not present

## 2018-10-04 DIAGNOSIS — R739 Hyperglycemia, unspecified: Secondary | ICD-10-CM | POA: Diagnosis not present

## 2018-10-04 DIAGNOSIS — M79604 Pain in right leg: Secondary | ICD-10-CM

## 2018-10-04 DIAGNOSIS — Z720 Tobacco use: Secondary | ICD-10-CM

## 2018-10-04 HISTORY — DX: Pain in right leg: M79.604

## 2018-10-04 MED ORDER — METRONIDAZOLE 1 % EX GEL: 1.0000 "application " | Freq: Every day | CUTANEOUS | 1 refills | Status: DC | PRN

## 2018-10-04 MED ORDER — LOSARTAN POTASSIUM-HCTZ 100-12.5 MG PO TABS: 1.0000 | ORAL_TABLET | Freq: Every day | ORAL | 3 refills | Status: DC

## 2018-10-04 MED ORDER — ROSUVASTATIN CALCIUM 20 MG PO TABS: 20.0000 mg | ORAL_TABLET | Freq: Every day | ORAL | 3 refills | Status: DC

## 2018-10-04 NOTE — Progress Notes (Signed)
Subjective:    Patient ID: Jerry Rios, male    DOB: 1951-09-08, 68 y.o.   MRN: [id number]  HPI  Here for wellness and f/u;  Overall doing ok;  Pt denies Chest pain, worsening SOB, DOE, wheezing, orthopnea, PND, worsening LE edema, palpitations, dizziness or syncope.  Pt denies neurological change such as new headache, facial or extremity weakness.  Pt denies polydipsia, polyuria, or low sugar symptoms. Pt states overall good compliance with treatment and medications, good tolerability, and has been trying to follow appropriate diet.  Pt denies worsening depressive symptoms, suicidal ideation or panic. No fever, night sweats, wt loss, loss of appetite, or other constitutional symptoms.  Pt states good ability with ADL's, has low fall risk, home safety reviewed and adequate, no other significant changes in hearing or vision, and only occasionally active with exercise.  Due for pneumovax.   Also c/o Right leg pain and numbness for 3 months, mild to mod, worse with walking, has known hx of lumbar spinal stenosis and seen per Dr Ellene Route, has hx of 10 yr smoking, asks for circulation testing since this seems different.  Nothing seems to make better.  Has hx of smoking x 10 yr Past Medical History:  Diagnosis Date  . Allergy   . Arthritis    lumbar spine  . BPH (benign prostatic hypertrophy)   . Cataract    starting- mild   . Diverticulosis of colon   . History of nephrolithiasis   . History of shingles   . Hx of colonic polyp   . Hyperlipidemia   . Hypertension    some past history   . Insomnia   . Libido, decreased 10/04/2011  . Stroke ([place]) 10/2014  . TIA (transient ischemic attack)   . Vitiligo 10/31/2012   Past Surgical History:  Procedure Laterality Date  . COLONOSCOPY    . CYSTOSCOPY WITH URETEROSCOPY AND STENT PLACEMENT Left 07/04/2014   Procedure: CYSTOSCOPY WITH LEFT RETROGRADE PYLEOGRAM, LEFT URETERAL DILATION AND URETEROSCOPY, LASER LITHOTRISPY AND STENT PLACEMENT;  Surgeon:  [name], MD;  Location: [facility];  Service: Urology;  Laterality: Left;  . HOLMIUM LASER APPLICATION N/A 06/02/2835   Procedure:  LASER LITHOTRIPSY;  Surgeon: [name], MD;  Location: [facility];  Service: Urology;  Laterality: N/A;  . lithotrypsy     x 3  . POLYPECTOMY    . TONSILLECTOMY      reports that he has quit smoking. He has never used smokeless tobacco. He reports current alcohol use of about 7.0 standard drinks of alcohol per week. He reports that he does not use drugs. family history includes Colitis in his sister; Heart disease in an other family member; Heart failure in his father, maternal grandfather, and mother; Hyperlipidemia in an other family member; Hypertension in an other family member; Lupus in his paternal grandfather; Ulcerative colitis in his sister. Allergies  Allergen Reactions  . Atorvastatin     REACTION: myalgia  . Rosuvastatin     REACTION: myalgia  . Simvastatin     REACTION: myalgia  . Adhesive [Tape] Rash   Current Outpatient Medications on File Prior to Visit  Medication Sig Dispense Refill  . aspirin 81 MG tablet Take 81 mg by mouth daily.    . bisacodyl (DULCOLAX) 5 MG EC tablet Take 5 mg by mouth daily as needed.    . cetirizine (ZYRTEC) 10 MG tablet Take 10 mg by mouth daily as needed for allergies.    . cholecalciferol (  VITAMIN D) 1000 UNITS tablet Take 1,000 Units by mouth every morning.    Marland Kitchen co-enzyme Q-10 30 MG capsule Take 100 mg by mouth daily.    Marland Kitchen docusate sodium (COLACE) 100 MG capsule Take 100 mg by mouth daily as needed for mild constipation.    . fluticasone (FLONASE) 50 MCG/ACT nasal spray Place 2 sprays into both nostrils daily. 48 g 1  . hydrochlorothiazide (MICROZIDE) 12.5 MG capsule Take 1 capsule (12.5 mg total) by mouth daily. **PATIENT NEEDS A PHYSICAL FOR ADDITIONAL REFILLS** 90 capsule 0  . magic mouthwash SOLN Take 5 mLs by mouth 3 (three) times daily as needed for mouth pain. 15 mL 1  . Melatonin 5 MG TABS  Take 1 tablet by mouth at bedtime.     . meloxicam (MOBIC) 15 MG tablet Take 1 tablet (15 mg total) daily by mouth. 30 tablet 0  . Naphazoline-Pheniramine (OPCON-A OP) Apply 1 drop to eye daily.    Marland Kitchen OVER THE COUNTER MEDICATION Trace mineral supplement daily- 20 drops    . psyllium (METAMUCIL) 58.6 % powder Take 1 packet by mouth daily.     No current facility-administered medications on file prior to visit.    Review of Systems  Constitutional: Negative for other unusual diaphoresis or sweats HENT: Negative for ear discharge or swelling Eyes: Negative for other worsening visual disturbances Respiratory: Negative for stridor or other swelling  Gastrointestinal: Negative for worsening distension or other blood Genitourinary: Negative for retention or other urinary change Musculoskeletal: Negative for other MSK pain or swelling Skin: Negative for color change or other new lesions Neurological: Negative for worsening tremors and other numbness  Psychiatric/Behavioral: Negative for worsening agitation or other fatigue All other system neg per pt    Objective:   Physical Exam .BP 112/68   Pulse (!) 55   Temp 97.7 F (36.5 C) (Oral)   Ht 5\' 7"  (1.702 m)   Wt 148 lb (67.1 kg)   SpO2 98%   BMI 23.18 kg/m  VS noted,  Constitutional: Pt appears in NAD HENT: Head: NCAT.  Right Ear: External ear normal.  Left Ear: External ear normal.  Eyes: . Pupils are equal, round, and reactive to light. Conjunctivae and EOM are normal Nose: without d/c or deformity Neck: Neck supple. Gross normal ROM Cardiovascular: Normal rate and regular rhythm.   Pulmonary/Chest: Effort normal and breath sounds without rales or wheezing.  Abd:  Soft, NT, ND, + BS, no organomegaly Neurological: Pt is alert. At baseline orientation, motor grossly intact Skin: Skin is warm. No rashes, other new lesions, no LE edema Psychiatric: Pt behavior is normal without agitation  No other exam findings Lab Results    Component Value Date   WBC 6.1 10/01/2018   HGB 14.4 10/01/2018   HCT 41.7 10/01/2018   PLT 223.0 10/01/2018   GLUCOSE 101 (H) 10/01/2018   CHOL 229 (H) 10/01/2018   TRIG 68.0 10/01/2018   HDL 89.80 10/01/2018   LDLDIRECT 206.1 11/01/2013   LDLCALC 125 (H) 10/01/2018   ALT 13 10/01/2018   AST 19 10/01/2018   NA 141 10/01/2018   K 4.0 10/01/2018   CL 102 10/01/2018   CREATININE 1.22 10/01/2018   BUN 16 10/01/2018   CO2 30 10/01/2018   TSH 1.88 10/01/2018   PSA 1.45 10/01/2018   HGBA1C 5.6 12/23/2014        Assessment & Plan:

## 2018-10-04 NOTE — Patient Instructions (Addendum)
You had the Pneumovax pneumonia shot today  Please continue all other medications as before, and refills have been done if requested.  Please have the pharmacy call with any other refills you may need.  Please continue your efforts at being more active, low cholesterol diet, and weight control.  You are otherwise up to date with prevention measures today.  Please keep your appointments with your specialists as you may have planned  You will be contacted regarding the referral for: Cardiac CT calcium score, and Leg circulation testing  Please return in 1 year for your yearly visit, or sooner if needed, with Lab testing done 3-5 days before`

## 2018-10-04 NOTE — Assessment & Plan Note (Signed)
stable overall by history and exam, recent data reviewed with pt, and pt to continue medical treatment as before,  to f/u any worsening symptoms or concerns  

## 2018-10-04 NOTE — Assessment & Plan Note (Addendum)
?  neuritic vs vascular  - for LE arterial dopplers,  to f/u any worsening symptoms or concerns  In addition to the time spent performing CPE, I spent an additional 15 minutes face to face,in which greater than 50% of this time was spent in counseling and coordination of care for patient's illness as documented, including the differential dx, treatment, further evaluation and other management of right leg pain and hyperglycemia

## 2018-10-04 NOTE — Assessment & Plan Note (Signed)

## 2018-10-05 ENCOUNTER — Encounter: Payer: Self-pay | Admitting: Internal Medicine

## 2018-10-22 ENCOUNTER — Ambulatory Visit (INDEPENDENT_AMBULATORY_CARE_PROVIDER_SITE_OTHER): Payer: 59 | Admitting: Physician Assistant

## 2018-10-22 ENCOUNTER — Encounter: Payer: Self-pay | Admitting: Physician Assistant

## 2018-10-22 VITALS — BP 104/64 | HR 56 | Ht 67.0 in | Wt 147.0 lb

## 2018-10-22 DIAGNOSIS — R1032 Left lower quadrant pain: Secondary | ICD-10-CM

## 2018-10-22 DIAGNOSIS — K5730 Diverticulosis of large intestine without perforation or abscess without bleeding: Secondary | ICD-10-CM

## 2018-10-22 MED ORDER — CIPROFLOXACIN HCL 500 MG PO TABS: 500.0000 mg | ORAL_TABLET | Freq: Two times a day (BID) | ORAL | 0 refills | Status: DC

## 2018-10-22 MED ORDER — METRONIDAZOLE 500 MG PO TABS: 500.0000 mg | ORAL_TABLET | Freq: Two times a day (BID) | ORAL | 0 refills | Status: DC

## 2018-10-22 NOTE — Patient Instructions (Addendum)
.  We sent refills  To [facility]. 1. Cipro 500 mg  2. Flagyl 500 mg  You have been scheduled for a CT scan of the abdomen and pelvis at [facility] ([address]---this is in the same building as [facility]).   You are scheduled on Thursday 11-01-18 at 3:00 PM. You should arrive at 2:45 PM prior to your appointment time for registration. Please follow the written instructions below on the day of your exam:  WARNING: IF YOU ARE ALLERGIC TO IODINE/X-RAY DYE, PLEASE NOTIFY RADIOLOGY IMMEDIATELY AT [phone]! YOU WILL BE GIVEN A 13 HOUR PREMEDICATION PREP.  1) Do not eat  anything after 11:00 am (4 hours prior to your test) 2) You have been given 2 bottles of oral contrast to drink. The solution may taste better if refrigerated, but do NOT add ice or any other liquid to this solution. Shake well before drinking.    Drink 1 bottle of contrast @ 1:00 Pm(2 hours prior to your exam)  Drink 1 bottle of contrast @ 2:00 PM (1 hour prior to your exam)  You may take any medications as prescribed with a small amount of water, if necessary. If you take any of the following medications: METFORMIN, GLUCOPHAGE, GLUCOVANCE, AVANDAMET, RIOMET, FORTAMET, [place] MET, JANUMET, GLUMETZA or METAGLIP, you MAY be asked to HOLD this medication 48 hours AFTER the exam.  The purpose of you drinking the oral contrast is to aid in the visualization of your intestinal tract. The contrast solution may cause some diarrhea. Depending on your individual set of symptoms, you may also receive an intravenous injection of x-ray contrast/dye. Plan on being at [facility] for 30 minutes or longer, depending on the type of exam you are having performed.  This test typically takes 30-45 minutes to complete.  If you have any questions regarding your exam or if you need to reschedule, you may call the CT department at [phone] between the hours of 8:00 am and 5:00 pm,  Monday-Friday.  ________________________________________________________________________ If the [facility] has a cancellation for Monday 10-29-18 they may call you.  Normal BMI (Body Mass Index- based on height and weight) is between 23 and 30. Your BMI today is Body mass index is 23.02 kg/m. Marland Kitchen Please consider follow up  regarding your BMI with your Primary Care Provider.

## 2018-10-22 NOTE — Progress Notes (Signed)
Subjective:    Patient ID: Jerry Rios, male    DOB: 06-10-1951, 68 y.o.   MRN: [id number]  HPI Jerry Rios is a 68 year old white male, known to Dr. Henrene Pastor.  He was last seen in the office myself in 69,  He was found to be H. pylori positive and treated.  He then underwent colonoscopy in April 2018 with finding of multiple small and large mouth diverticuli throughout the colon well as internal hemorrhoids.  There were no polyps. He comes in today with complaints of a dull abdominal discomfort on the left which is been present over the past few weeks.  He says about 2 weeks ago he had one episode of sharp pain very lateral on his left side which lasted for couple of hours and then resolved.  Since then he has had rather dull ongoing discomfort.  He has not had any associated chills or fever.  Bowel movements have been fairly normal for him with mild constipation, no melena or hematochezia.  He does take a fiber supplement regularly.  Appetite has been okay, and weight has been stable. He says he is reluctant to take antibiotics because he does not want to become resistant.  He states he has been doing some reading and is concerned about his gallbladder and pancreas. He brought a copy of an MRI of his lumbar spine with him, in event we could review this study from April 2019 to further assess his abdominal pain. Patient does have prior history of sessile serrated polyp in 2007, history of nephrolithiasis, hypertension and prior TIA.  Review of Systems Pertinent positive and negative review of systems were noted in the above HPI section.  All other review of systems was otherwise negative.  Outpatient Encounter Medications as of 69/27/2020  Medication Sig  . aspirin 81 MG tablet Take 81 mg by mouth daily.  . bisacodyl (DULCOLAX) 5 MG EC tablet Take 5 mg by mouth daily as needed.  . cetirizine (ZYRTEC) 10 MG tablet Take 10 mg by mouth daily as needed for allergies.  . cholecalciferol (VITAMIN D)  1000 UNITS tablet Take 1,000 Units by mouth every morning.  Marland Kitchen co-enzyme Q-10 30 MG capsule Take 100 mg by mouth daily.  Marland Kitchen docusate sodium (COLACE) 100 MG capsule Take 100 mg by mouth daily as needed for mild constipation.  . fluticasone (FLONASE) 50 MCG/ACT nasal spray Place 2 sprays into both nostrils daily.  Marland Kitchen losartan-hydrochlorothiazide (HYZAAR) 100-12.5 MG tablet Take 1 tablet by mouth daily.  . magic mouthwash SOLN Take 5 mLs by mouth 3 (three) times daily as needed for mouth pain.  . Melatonin 5 MG TABS Take 1 tablet by mouth at bedtime.   . meloxicam (MOBIC) 15 MG tablet Take 1 tablet (15 mg total) daily by mouth.  . metroNIDAZOLE (METROGEL) 1 % gel Apply 1 application topically daily as needed (rosacea).  . Naphazoline-Pheniramine (OPCON-A OP) Apply 1 drop to eye daily.  Marland Kitchen OVER THE COUNTER MEDICATION Trace mineral supplement daily- 20 drops  . psyllium (METAMUCIL) 58.6 % powder Take 1 packet by mouth daily.  . rosuvastatin (CRESTOR) 20 MG tablet Take 1 tablet (20 mg total) by mouth daily.  . ciprofloxacin (CIPRO) 500 MG tablet Take 1 tablet (500 mg total) by mouth 2 (two) times daily.  . metroNIDAZOLE (FLAGYL) 500 MG tablet Take 1 tablet (500 mg total) by mouth 2 (two) times daily.  . [DISCONTINUED] hydrochlorothiazide (MICROZIDE) 12.5 MG capsule Take 1 capsule (12.5 mg total) by mouth daily. **PATIENT  NEEDS A PHYSICAL FOR ADDITIONAL REFILLS**   No facility-administered encounter medications on file as of 69/27/2020.    Allergies  Allergen Reactions  . Crestor [Rosuvastatin]     REACTION: myalgia  . Lipitor [Atorvastatin]     REACTION: myalgia  . Zocor [Simvastatin]     REACTION: myalgia  . Adhesive [Tape] Rash   Patient Active Problem List   Diagnosis Date Noted  . Right leg pain 10/04/2018  . Cervical radiculopathy 08/01/2017  . Multinodular goiter 12/14/2016  . HTN (hypertension) 07/21/2016  . Hyperglycemia 11/19/2014  . TIA (transient ischemic attack) 11/18/2014  .  Abdominal pain, unspecified site 05/30/2014  . Nausea in adult patient 05/30/2014  . Loss of weight 05/30/2014  . Vitiligo 10/31/2012  . Libido, decreased 10/04/2011  . Encounter for well adult exam with abnormal findings 10/01/2011  . RENAL INSUFFICIENCY 09/07/2010  . ECZEMA, ATOPIC 11/12/2009  . HEMORRHOIDS 07/10/2008  . BACK PAIN, LUMBAR 07/10/2008  . Hyperlipidemia 08/05/2007  . DIVERTICULOSIS, COLON 08/05/2007  . BENIGN PROSTATIC HYPERTROPHY 08/05/2007  . COLONIC POLYPS, HX OF 08/05/2007  . NEPHROLITHIASIS, HX OF 08/05/2007  . Depression 04/08/2007  . Allergic rhinitis 04/08/2007  . Insomnia, unspecified 04/08/2007   Social History   Socioeconomic History  . Marital status: Single    Spouse name: Not on file  . Number of children: Not on file  . Years of education: Not on file  . Highest education level: Not on file  Occupational History  . Occupation: IT  Social Needs  . Financial resource strain: Not on file  . Food insecurity:    Worry: Not on file    Inability: Not on file  . Transportation needs:    Medical: Not on file    Non-medical: Not on file  Tobacco Use  . Smoking status: Former Research scientist (life sciences)  . Smokeless tobacco: Never Used  . Tobacco comment: Regular exercise - No  Substance and Sexual Activity  . Alcohol use: Yes    Alcohol/week: 7.0 standard drinks    Types: 7 Cans of beer per week  . Drug use: No  . Sexual activity: Never  Lifestyle  . Physical activity:    Days per week: Not on file    Minutes per session: Not on file  . Stress: Not on file  Relationships  . Social connections:    Talks on phone: Not on file    Gets together: Not on file    Attends religious service: Not on file    Active member of club or organization: Not on file    Attends meetings of clubs or organizations: Not on file    Relationship status: Not on file  . Intimate partner violence:    Fear of current or ex partner: Not on file    Emotionally abused: Not on file     Physically abused: Not on file    Forced sexual activity: Not on file  Other Topics Concern  . Not on file  Social History Narrative   Works at [employer], divorce, no biological children    Mr. Marchant family history includes Colitis in his sister; Heart disease in an other family member; Heart failure in his father, maternal grandfather, and mother; Hyperlipidemia in an other family member; Hypertension in an other family member; Lupus in his paternal grandfather; Ulcerative colitis in his sister.      Objective:    Vitals:   10/22/18 1024  BP: 104/64  Pulse: (!) 56    Physical Exam;  well-developed older white male in no acute distress.  Height 5 foot 7, weight 147, BMI 23.0.  HEENT; nontraumatic normocephalic EOMI PERRLA sclera anicteric oral mucosa moist, Cardiovascular; regular rate and rhythm with S1-S2.  Pulmonary ;clear bilaterally, Abdomen; soft, there is some mild tenderness in the left lower and left mid quadrant there is no guarding or rebound no palpable mass or hepatosplenomegaly sounds are present.  Rectal ;exam not done, Extremities ;no clubbing cyanosis or edema skin warm dry, Neuropsych; alert and oriented, grossly nonfocal mood and affect appropriate       Assessment & Plan:   #63 67 year old white male with 2 to 3-week history of dull left mid abdominal discomfort, and one episode of more severe sharp pain at onset about 2 weeks ago. Etiology of patient's symptoms is not entirely clear, I suspect he has mild diverticulitis. #2.  Colon cancer surveillance-up-to-date with colonoscopy April 2018-no polyps #3.  History of nephrolithiasis #4.  Hypertension  Plan; Patient had recent labs through his PCP, those were done earlier this month and reviewed and unremarkable. Will schedule for CT scan of the abdomen and pelvis. We will send a prescription for Cipro 500 mg p.o. twice daily x7 days and Flagyl 500 mg p.o. twice daily x7 days.  Patient would like to have this on  hand in the event that he decides to go ahead and start an antibiotic.  Jerry Rios Genia Harold PA-C 10/22/2018   Cc: [name], MD

## 2018-10-23 NOTE — Progress Notes (Signed)
Assessment and plan noted ?

## 2018-10-26 ENCOUNTER — Other Ambulatory Visit: Payer: 59

## 2018-10-31 ENCOUNTER — Ambulatory Visit (INDEPENDENT_AMBULATORY_CARE_PROVIDER_SITE_OTHER)
Admission: RE | Admit: 2018-10-31 | Discharge: 2018-10-31 | Disposition: A | Discharge status: Home / Self Care | Payer: Self-pay | Source: Ambulatory Visit | Attending: Internal Medicine | Admitting: Internal Medicine

## 2018-10-31 ENCOUNTER — Ambulatory Visit (INDEPENDENT_AMBULATORY_CARE_PROVIDER_SITE_OTHER)
Admission: RE | Admit: 2018-10-31 | Discharge: 2018-10-31 | Disposition: A | Discharge status: Home / Self Care | Payer: 59 | Source: Ambulatory Visit | Attending: Physician Assistant | Admitting: Physician Assistant

## 2018-10-31 DIAGNOSIS — R1032 Left lower quadrant pain: Secondary | ICD-10-CM | POA: Diagnosis not present

## 2018-10-31 DIAGNOSIS — Z720 Tobacco use: Secondary | ICD-10-CM

## 2018-10-31 MED ORDER — IOPAMIDOL (ISOVUE-300) INJECTION 61%
100.0000 mL | Freq: Once | INTRAVENOUS | Status: AC | PRN
  Administered 2018-10-31: 100 mL via INTRAVENOUS

## 2018-11-01 ENCOUNTER — Telehealth: Payer: Self-pay

## 2018-11-01 ENCOUNTER — Other Ambulatory Visit: Payer: 59

## 2018-11-01 ENCOUNTER — Other Ambulatory Visit: Payer: Self-pay | Admitting: Internal Medicine

## 2018-11-01 DIAGNOSIS — R931 Abnormal findings on diagnostic imaging of heart and coronary circulation: Secondary | ICD-10-CM

## 2018-11-01 NOTE — Telephone Encounter (Signed)
-----   Message from [name], MD sent at 11/01/2018 12:22 PM EST ----- Left message on MyChart, pt to cont same tx except  The test results show that your current treatment is OK, except the CT calcium score is 684, which is quite high, and means high risk for eventual heart problems such as even heart attacks.  I will refer you to Cardiology, as you will need further testing such as stress testing.Redmond Baseman to please inform pt, I will do referral

## 2018-11-01 NOTE — Telephone Encounter (Signed)
Pt has viewed results via MyChart  

## 2018-11-15 ENCOUNTER — Other Ambulatory Visit: Payer: Self-pay | Admitting: Internal Medicine

## 2018-11-15 DIAGNOSIS — I739 Peripheral vascular disease, unspecified: Secondary | ICD-10-CM

## 2018-11-15 DIAGNOSIS — Z720 Tobacco use: Secondary | ICD-10-CM

## 2018-11-15 DIAGNOSIS — M79604 Pain in right leg: Secondary | ICD-10-CM

## 2018-11-25 ENCOUNTER — Other Ambulatory Visit: Payer: Self-pay | Admitting: Internal Medicine

## 2018-11-27 ENCOUNTER — Ambulatory Visit (HOSPITAL_COMMUNITY)
Admission: RE | Admit: 2018-11-27 | Discharge: 2018-11-27 | Disposition: A | Discharge status: Home / Self Care | Payer: 59 | Source: Ambulatory Visit | Attending: Cardiology | Admitting: Cardiology

## 2018-11-27 DIAGNOSIS — M79604 Pain in right leg: Secondary | ICD-10-CM | POA: Diagnosis not present

## 2018-11-27 DIAGNOSIS — Z720 Tobacco use: Secondary | ICD-10-CM

## 2018-11-27 DIAGNOSIS — I739 Peripheral vascular disease, unspecified: Secondary | ICD-10-CM | POA: Diagnosis present

## 2018-12-14 ENCOUNTER — Ambulatory Visit: Payer: 59 | Admitting: Cardiology

## 2019-01-11 ENCOUNTER — Telehealth: Payer: Self-pay | Admitting: Physician Assistant

## 2019-01-11 NOTE — Telephone Encounter (Signed)
Patient would like to know if we can schedule a MRI for him. From previous phone notes Amy Trellis Paganini wants to schedule a MRI sometime in April. Would like to know if he should go ahead and do it or wait after the pandemic.

## 2019-01-11 NOTE — Telephone Encounter (Signed)
Patient advised that we will need to wait until Covid is over to schedule MRI- he is advised that we will be in contact once we can resume ordering imaging.

## 2019-01-11 NOTE — Telephone Encounter (Signed)
Per CT report 10/31/18 patient needs MRI in April.  Patient will need to wait until after Covid- 19.    Left message for patient to call back to discuss

## 2019-02-25 ENCOUNTER — Other Ambulatory Visit: Payer: Self-pay

## 2019-02-25 ENCOUNTER — Telehealth (INDEPENDENT_AMBULATORY_CARE_PROVIDER_SITE_OTHER): Payer: 59 | Admitting: Cardiology

## 2019-02-25 ENCOUNTER — Encounter: Payer: Self-pay | Admitting: Cardiology

## 2019-02-25 ENCOUNTER — Telehealth: Payer: Self-pay

## 2019-02-25 ENCOUNTER — Ambulatory Visit: Payer: 59 | Admitting: Cardiology

## 2019-02-25 DIAGNOSIS — I10 Essential (primary) hypertension: Secondary | ICD-10-CM | POA: Diagnosis not present

## 2019-02-25 DIAGNOSIS — E7800 Pure hypercholesterolemia, unspecified: Secondary | ICD-10-CM | POA: Diagnosis not present

## 2019-02-25 DIAGNOSIS — K769 Liver disease, unspecified: Secondary | ICD-10-CM

## 2019-02-25 DIAGNOSIS — I2510 Atherosclerotic heart disease of native coronary artery without angina pectoris: Secondary | ICD-10-CM

## 2019-02-25 NOTE — Telephone Encounter (Signed)
Spoke with the patient about scheduling the follow up MRI for repeat imaging of a liver lesion. Patient says he is interested. He will call back with dates he is available.

## 2019-02-25 NOTE — Telephone Encounter (Signed)
New message:    Patient calling concerning his appt today. Patient is having some issues getting into his account. Please call patient back.

## 2019-02-25 NOTE — Telephone Encounter (Signed)
Spoke with the patient. He is scheduled for the MRI at [facility] on 03/06/19 at 10:00 am. Arrived 9:30 am and fast for 4 hours prior to the appointment.

## 2019-02-25 NOTE — Telephone Encounter (Signed)
Error

## 2019-02-25 NOTE — Progress Notes (Signed)
Virtual Visit via telephone Note   This visit type was conducted due to national recommendations for restrictions regarding the COVID-19 Pandemic (e.g. social distancing) in an effort to limit this patient's exposure and mitigate transmission in our community.  Due to his co-morbid illnesses, this patient is at least at moderate risk for complications without adequate follow up.  This format is felt to be most appropriate for this patient at this time.  All issues noted in this document were discussed and addressed.  A limited physical exam was performed with this format.  Please refer to the patient's chart for his consent to telehealth for [facility].  Evaluation Performed:  Follow-up visit  This visit type was conducted due to national recommendations for restrictions regarding the COVID-19 Pandemic (e.g. social distancing).  This format is felt to be most appropriate for this patient at this time.  All issues noted in this document were discussed and addressed.  No physical exam was performed (except for noted visual exam findings with Video Visits).  Please refer to the patient's chart (MyChart message for video visits and phone note for telephone visits) for the patient's consent to telehealth for [facility].  Date:  02/25/2019   ID:  Jerry Rios, DOB 1951/09/14, MRN [id number]  Patient Location:  Home  Provider location:   [place]  PCP:  [name], MD  Cardiologist:   Electrophysiologist:  None   Chief Complaint:  Coronary artery calcium   History of Present Illness:    Jerry Rios is a 68 y.o. male who presents via audio/video conferencing for a telehealth visit today in referral by Cathlean Cower, MD for evaluation of coronary artery calcium.   This is a 68yo male with a history of CVA, HTN and hyperlipidemia.  He was seen for routine PE and his PCP ordered a coronary calcium score which was elevated at  684 with calcium noted in all 3 coronary arteries  and LM. He is here today for followup and is doing well.  He denies any SOB, DOE, PND, orthopnea,  No significant dizziness, palpitations or syncope. He is not sure if he has had any chest pain but says that when he leans forward or bends over sometimes he feels some chest pressure.   He used to be a runner but had to quit 2 years ago due to spinal problems.  He says that his lung capacity seems to have dropped some. Occasionally he will have some mild LE edema at the ankles which has gotten better lately.    He is compliant with his meds and is tolerating meds with no SE.  He used to smoke for 10 years but quit at age 67.  His mom died of an MI at age 65 and his Dad died from cardiac arrest at 31.    The patient does not have symptoms concerning for COVID-19 infection (fever, chills, cough, or new shortness of breath).   Prior CV studies:   The following studies were reviewed today:  Coronary calcium CT  Past Medical History:  Diagnosis Date  . Abdominal pain, unspecified site 05/30/2014  . Allergic rhinitis 04/08/2007   Qualifier: Diagnosis of  By: [name] MD, [name]   . Allergy   . Arthritis    lumbar spine  . BACK PAIN, LUMBAR 07/10/2008   Qualifier: Diagnosis of  By: [name] MD, [name]   . BENIGN PROSTATIC HYPERTROPHY 08/05/2007   Qualifier: Diagnosis of  By: [name] MD, Jeneen Rinks  W   . BPH (benign prostatic hypertrophy)   . Cataract    starting- mild   . Cervical radiculopathy 08/01/2017  . COLONIC POLYPS, HX OF 08/05/2007   Adenoma polpy per last coonscopy Sep 01, 2006, Dr Henrene Pastor   . Cough 11/12/2009   Qualifier: Diagnosis of  By: [name] MD, Arvid Right.   . Depression 04/08/2007   Qualifier: Diagnosis of  By: [name] MD, [name]   . Diverticulosis of colon   . DIVERTICULOSIS, COLON 08/05/2007   Qualifier: Diagnosis of  By: [name] MD, [place]inne, ATOPIC 11/12/2009   Qualifier: Diagnosis of  By: [name] MD, Arvid Right.   . HEMORRHOIDS 07/10/2008   Qualifier: Diagnosis of  By: [name] MD, [name]   .  History of nephrolithiasis   . History of shingles   . Hx of colonic polyp   . Hyperglycemia 11/19/2014  . Hyperlipidemia   . Hypertension    some past history   . Insomnia   . Insomnia, unspecified 04/08/2007   Centricity Description: SYMPTOM, INSOMNIA NOS Qualifier: Diagnosis of  By: [name] MD, [name]  Centricity Description: INSOMNIA-SLEEP DISORDER-UNSPEC Qualifier: Diagnosis of  By: [name] MD, [place], LEFT 07/10/2008   Qualifier: Diagnosis of  By: [name] MD, [name]   . Libido, decreased 10/04/2011  . Loss of weight 05/30/2014  . Multinodular goiter 12/14/2016  . Nausea in adult patient 05/30/2014  . NEPHROLITHIASIS, HX OF 08/05/2007   Qualifier: Diagnosis of  By: [name] MD, [name]   . RENAL INSUFFICIENCY 09/07/2010   Qualifier: Diagnosis of  By: [name] MD, [name]   . Right leg pain 10/04/2018  . Stroke ([place]) 10/2014  . TIA (transient ischemic attack)   . Vitiligo 10/31/2012   Past Surgical History:  Procedure Laterality Date  . CERVICAL DISC ARTHROPLASTY  12/08/2017  . COLONOSCOPY    . CYSTOSCOPY WITH URETEROSCOPY AND STENT PLACEMENT Left 07/04/2014   Procedure: CYSTOSCOPY WITH LEFT RETROGRADE PYLEOGRAM, LEFT URETERAL DILATION AND URETEROSCOPY, LASER LITHOTRISPY AND STENT PLACEMENT;  Surgeon: [name], MD;  Location: [facility];  Service: Urology;  Laterality: Left;  . HOLMIUM LASER APPLICATION N/A 32/05/5187   Procedure:  LASER LITHOTRIPSY;  Surgeon: [name], MD;  Location: [facility];  Service: Urology;  Laterality: N/A;  . lithotrypsy     x 3  . POLYPECTOMY    . TONSILLECTOMY       Current Meds  Medication Sig  . aspirin 81 MG tablet Take 81 mg by mouth daily.  . bisacodyl (DULCOLAX) 5 MG EC tablet Take 5 mg by mouth daily as needed.  . cetirizine (ZYRTEC) 10 MG tablet Take 10 mg by mouth daily as needed for allergies.  Marland Kitchen co-enzyme Q-10 30 MG capsule Take 100 mg by mouth as needed.   . docusate sodium (COLACE) 100 MG capsule Take 100 mg by mouth daily as  needed for mild constipation.  . fluticasone (FLONASE) 50 MCG/ACT nasal spray USE 2 SPRAYS IN EACH  NOSTRIL DAILY  . losartan-hydrochlorothiazide (HYZAAR) 100-12.5 MG tablet Take 1 tablet by mouth as needed.  . Melatonin 5 MG TABS Take 1 tablet by mouth as needed.   . metroNIDAZOLE (METROGEL) 1 % gel APPLY TO AFFECTED AREA(S)  TOPICALLY DAILY AS NEEDED  FOR ROSACEA  . Naphazoline-Pheniramine (OPCON-A OP) Apply 1 drop to eye as needed.   Marland Kitchen OVER THE COUNTER MEDICATION Trace mineral supplement daily- 10 drops  . psyllium (  METAMUCIL) 58.6 % powder Take 1 packet by mouth daily.  . rosuvastatin (CRESTOR) 20 MG tablet Take 1 tablet (20 mg total) by mouth daily.  . Vitamin D, Cholecalciferol, 10 MCG (400 UNIT) CAPS Take 1 capsule by mouth daily.     Allergies:   Crestor [rosuvastatin]; Lipitor [atorvastatin]; Zocor [simvastatin]; and Adhesive [tape]   Social History   Tobacco Use  . Smoking status: Former Research scientist (life sciences)  . Smokeless tobacco: Never Used  . Tobacco comment: Regular exercise - No  Substance Use Topics  . Alcohol use: Yes    Alcohol/week: 7.0 standard drinks    Types: 7 Cans of beer per week  . Drug use: No     Family Hx: The patient's family history includes CAD in his father and mother; Colitis in his sister; Heart disease in an other family member; Heart failure in his father, maternal grandfather, and mother; Hyperlipidemia in an other family member; Hypertension in an other family member; Lupus in his paternal grandfather; Ulcerative colitis in his sister. There is no history of Colon cancer, Esophageal cancer, Pancreatic cancer, Stomach cancer, Liver disease, Colon polyps, or Rectal cancer.  ROS:   Please see the history of present illness.     All other systems reviewed and are negative.   Labs/Other Tests and Data Reviewed:    Recent Labs: 10/01/2018: ALT 13; BUN 16; Creatinine, Ser 1.22; Hemoglobin 14.4; Platelets 223.0; Potassium 4.0; Sodium 141; TSH 1.88   Recent Lipid  Panel Lab Results  Component Value Date/Time   CHOL 229 (H) 10/01/2018 02:12 PM   TRIG 68.0 10/01/2018 02:12 PM   HDL 89.80 10/01/2018 02:12 PM   CHOLHDL 3 10/01/2018 02:12 PM   LDLCALC 125 (H) 10/01/2018 02:12 PM   LDLDIRECT 206.1 11/01/2013 09:42 AM    Wt Readings from Last 3 Encounters:  02/25/19 131 lb (59.4 kg)  10/22/18 147 lb (66.7 kg)  10/04/18 148 lb (67.1 kg)     Objective:    Vital Signs:  Ht 5\' 7"  (1.702 m)   Wt 131 lb (59.4 kg)   BMI 20.52 kg/m    CONSTITUTIONAL:  Well nourished, well developed male in no acute distress.  EYES: anicteric MOUTH: oral mucosa is pink RESPIRATORY: Normal respiratory effort, symmetric expansion CARDIOVASCULAR: No peripheral edema SKIN: No rash, lesions or ulcers MUSCULOSKELETAL: no digital cyanosis NEURO: Cranial Nerves II-XII grossly intact, moves all extremities PSYCH: Intact judgement and insight.  A&O x 3, Mood/affect appropriate   ASSESSMENT & PLAN:    1.  Coronary artery calcifications - coronary calcium was noted in all 3 coronary arteries including left main.  Calcium score is high at greater than 600.  I recommended a nuclear stress test to rule out ischemia.  He needs aggressive risk factor modification.  His last LDL was 125 in January.  He is now on Crestor 20 mg daily.  I am going to repeat an FLP and ALT.  His goal is less than 70.  He will continue on aspirin 81 mg daily.  2.  Hyperlipidemia -again as stated above his LDL goal is less than 70 and I have recommended repeating FLP and ALT.  He does not think that he will be able to tolerate a higher dose. If his LDL is elevated still then will refer to lipid clinic for PCSK9i.    I encouraged exercise daily as well as following the Mediterranean diet.  3.  Hypertension -his blood pressure is controlled at home around 125/54mmHg.   He will  continue on losartan HCT 100/12.5 mg daily.  His last creatinine was stable at 1.22.  4.  COVID-19 Education:The signs and symptoms  of COVID-19 were discussed with the patient and how to seek care for testing (follow up with PCP or arrange E-visit).  The importance of social distancing was discussed today.  Patient Risk:   After full review of this patient's clinical status, I feel that they are at least moderate risk at this time.  Time:   Today, I have spent 15 minutes directly with the patient on vidoe discussing medical problems including HTN, coronary ca, lipids.  We also reviewed the symptoms of COVID 19 and the ways to protect against contracting the virus with telehealth technology.  I spent an additional 5 minutes reviewing patient's chart including chest CT for coronary ca score, labs.  Medication Adjustments/Labs and Tests Ordered: Current medicines are reviewed at length with the patient today.  Concerns regarding medicines are outlined above.  Tests Ordered: No orders of the defined types were placed in this encounter.  Medication Changes: No orders of the defined types were placed in this encounter.   Disposition:  Follow up in 1 year(s)  Signed, Fransico Him, MD  02/25/2019 1:37 PM    [facility]

## 2019-02-25 NOTE — Telephone Encounter (Signed)
-----   Message from Marlon Pel, RN sent at 01/11/2019 10:29 AM EDT ----- Per CT report 10/31/18 patient needs MRI in April.  Patient will need to wait until after Covid- 19.

## 2019-02-25 NOTE — Patient Instructions (Addendum)
Medication Instructions:  Your physician recommends that you continue on your current medications as directed. Please refer to the Current Medication list given to you today.  If you need a refill on your cardiac medications before your next appointment, please call your pharmacy.   Lab work: Lipid and Liver: Same day as stress test  If you have labs (blood work) drawn today and your tests are completely normal, you will receive your results only by: Marland Kitchen MyChart Message (if you have MyChart) OR . A paper copy in the mail If you have any lab test that is abnormal or we need to change your treatment, we will call you to review the results.  Testing/Procedures: Your physician has requested that you have a lexiscan myoview. For further information please visit [email or web address]. Please follow instruction sheet, as given.  Follow-Up: At [facility], you and your health needs are our priority.  As part of our continuing mission to provide you with exceptional heart care, we have created designated Provider Care Teams.  These Care Teams include your primary Cardiologist (physician) and Advanced Practice Providers (APPs -  Physician Assistants and Nurse Practitioners) who all work together to provide you with the care you need, when you need it. You will need a follow up appointment in 1 years.  Please call our office 2 months in advance to schedule this appointment.  You may see Dr. Radford Pax or one of the following Advanced Practice Providers on your designated Care Team:   [place], PA-C Melina Copa, PA-C . Ermalinda Barrios, PA-C  STRESS TEST INSTRUCTIONS  You are scheduled for a Myocardial Perfusion Imaging Study. Please arrive 15 minutes prior to your appointment time for registration and insurance purposes.  The test will take approximately 3 to 4 hours to complete; you may bring reading material.  If someone comes with you to your appointment, they will need to remain in the main lobby  due to limited space in the testing area. **If you are pregnant or breastfeeding, please notify the nuclear lab prior to your appointment**  How to prepare for your Myocardial Perfusion Test: . Do not eat or drink 3 hours prior to your test, except you may have water. . Do not consume products containing caffeine (regular or decaffeinated) 12 hours prior to your test. (ex: coffee, chocolate, sodas, tea). Do wear comfortable clothes (no dresses or overalls) and walking shoes, tennis shoes preferred (No heels or open toe shoes are allowed). . Do NOT wear cologne, perfume, aftershave, or lotions (deodorant is allowed). . If these instructions are not followed, your test will have to be rescheduled.  Please report to [address], [address] for your test.  If you have questions or concerns about your appointment, you can call the Nuclear Lab at [phone].  If you cannot keep your appointment, please provide 24 hours notification to the Nuclear Lab, to avoid a possible $50 charge to your account.

## 2019-02-28 ENCOUNTER — Telehealth (HOSPITAL_COMMUNITY): Payer: Self-pay

## 2019-02-28 ENCOUNTER — Encounter: Payer: Self-pay | Admitting: Internal Medicine

## 2019-02-28 NOTE — Telephone Encounter (Signed)
Called pt no answer LMOM w/MD response../lmb 

## 2019-02-28 NOTE — Telephone Encounter (Signed)
Staff to contact pt - for virtual appt regarding 25 lb wt loss

## 2019-02-28 NOTE — Telephone Encounter (Signed)
Instructions left on the patient's answering phone, asked to call us back with any questions. S.Lonzy Mato EMTP

## 2019-03-02 ENCOUNTER — Ambulatory Visit: Payer: 59

## 2019-03-05 ENCOUNTER — Other Ambulatory Visit: Payer: Self-pay

## 2019-03-05 ENCOUNTER — Ambulatory Visit (HOSPITAL_BASED_OUTPATIENT_CLINIC_OR_DEPARTMENT_OTHER): Payer: 59

## 2019-03-05 ENCOUNTER — Other Ambulatory Visit: Payer: 59

## 2019-03-05 DIAGNOSIS — I2510 Atherosclerotic heart disease of native coronary artery without angina pectoris: Secondary | ICD-10-CM | POA: Diagnosis present

## 2019-03-05 DIAGNOSIS — E7800 Pure hypercholesterolemia, unspecified: Secondary | ICD-10-CM

## 2019-03-05 DIAGNOSIS — K769 Liver disease, unspecified: Secondary | ICD-10-CM | POA: Diagnosis present

## 2019-03-05 LAB — LIPID PANEL
Chol/HDL Ratio: 2.5 ratio (ref 0.0–5.0)
Cholesterol, Total: 254 mg/dL — ABNORMAL HIGH (ref 100–199)
HDL: 101 mg/dL (ref >39)
LDL Calculated: 145 mg/dL — ABNORMAL HIGH (ref 0–99)
Triglycerides: 41 mg/dL (ref 0–149)
VLDL Cholesterol Cal: 8 mg/dL (ref 5–40)

## 2019-03-05 LAB — MYOCARDIAL PERFUSION IMAGING
LV dias vol: 86 mL (ref 62–150)
LV sys vol: 37 mL
Peak HR: 83 {beats}/min
Rest HR: 50 {beats}/min
SDS: 3
SRS: 0
SSS: 3
TID: 1.05

## 2019-03-05 LAB — HEPATIC FUNCTION PANEL
ALT: 14 IU/L (ref 0–44)
AST: 22 IU/L (ref 0–40)
Albumin: 4.5 g/dL (ref 3.8–4.8)
Alkaline Phosphatase: 32 IU/L — ABNORMAL LOW (ref 39–117)
Bilirubin Total: 0.6 mg/dL (ref 0.0–1.2)
Bilirubin, Direct: 0.13 mg/dL (ref 0.00–0.40)
Total Protein: 6.3 g/dL (ref 6.0–8.5)

## 2019-03-05 MED ORDER — TECHNETIUM TC 99M TETROFOSMIN IV KIT
10.7000 | PACK | Freq: Once | INTRAVENOUS | Status: AC | PRN
  Administered 2019-03-05: 10.7 via INTRAVENOUS
  Filled 2019-03-05: qty 11

## 2019-03-05 MED ORDER — TECHNETIUM TC 99M TETROFOSMIN IV KIT
31.7000 | PACK | Freq: Once | INTRAVENOUS | Status: AC | PRN
  Administered 2019-03-05: 31.7 via INTRAVENOUS
  Filled 2019-03-05: qty 32

## 2019-03-05 MED ORDER — REGADENOSON 0.4 MG/5ML IV SOLN
0.4000 mg | Freq: Once | INTRAVENOUS | Status: AC
  Administered 2019-03-05: 0.4 mg via INTRAVENOUS

## 2019-03-06 ENCOUNTER — Ambulatory Visit (HOSPITAL_COMMUNITY)
Admission: RE | Admit: 2019-03-06 | Discharge: 2019-03-06 | Disposition: A | Discharge status: Home / Self Care | Payer: 59 | Source: Ambulatory Visit | Attending: Physician Assistant | Admitting: Physician Assistant

## 2019-03-06 DIAGNOSIS — K769 Liver disease, unspecified: Secondary | ICD-10-CM | POA: Insufficient documentation

## 2019-03-06 DIAGNOSIS — I2510 Atherosclerotic heart disease of native coronary artery without angina pectoris: Secondary | ICD-10-CM | POA: Insufficient documentation

## 2019-03-06 LAB — POCT I-STAT CREATININE: Creatinine, Ser: 1.6 mg/dL — ABNORMAL HIGH (ref 0.61–1.24)

## 2019-03-06 MED ORDER — GADOBUTROL 1 MMOL/ML IV SOLN
6.0000 mL | Freq: Once | INTRAVENOUS | Status: AC | PRN
  Administered 2019-03-06: 6 mL via INTRAVENOUS

## 2019-03-07 ENCOUNTER — Ambulatory Visit (INDEPENDENT_AMBULATORY_CARE_PROVIDER_SITE_OTHER): Payer: 59 | Admitting: Internal Medicine

## 2019-03-07 ENCOUNTER — Encounter: Payer: Self-pay | Admitting: Internal Medicine

## 2019-03-07 ENCOUNTER — Telehealth: Payer: Self-pay

## 2019-03-07 DIAGNOSIS — E7800 Pure hypercholesterolemia, unspecified: Secondary | ICD-10-CM

## 2019-03-07 DIAGNOSIS — I10 Essential (primary) hypertension: Secondary | ICD-10-CM | POA: Diagnosis not present

## 2019-03-07 DIAGNOSIS — R634 Abnormal weight loss: Secondary | ICD-10-CM

## 2019-03-07 DIAGNOSIS — R739 Hyperglycemia, unspecified: Secondary | ICD-10-CM

## 2019-03-07 NOTE — Assessment & Plan Note (Signed)
I suspect may be diet related but cant r/o other,  Lab Results  Component Value Date   WBC 6.1 10/01/2018   HGB 14.4 10/01/2018   HCT 41.7 10/01/2018   PLT 223.0 10/01/2018   GLUCOSE 101 (H) 10/01/2018   CHOL 254 (H) 03/05/2019   TRIG 41 03/05/2019   HDL 101 03/05/2019   LDLDIRECT 206.1 11/01/2013   LDLCALC 145 (H) 03/05/2019   ALT 14 03/05/2019   AST 22 03/05/2019   NA 141 10/01/2018   K 4.0 10/01/2018   CL 102 10/01/2018   CREATININE 1.60 (H) 03/06/2019   BUN 16 10/01/2018   CO2 30 10/01/2018   TSH 1.88 10/01/2018   PSA 1.45 10/01/2018   HGBA1C 5.6 12/23/2014  Recent labs ok, as well as recent CT abd/pelvis and liver MRI.  Will check cxr, and consider f/u with GI for possible EGD for further wt loss

## 2019-03-07 NOTE — Telephone Encounter (Signed)
Spoke with the patient, he expressed understanding about going to the lipid clinic.

## 2019-03-07 NOTE — Progress Notes (Signed)
Patient ID: Jerry Rios, male   DOB: 27-Jun-1951, 68 y.o.   MRN: [id number]  Virtual Visit via Video Note  I connected with Jerry Rios on 03/07/19 at  4:20 PM EDT by a video enabled telemedicine application and verified that I am speaking with the correct person using two identifiers.  Location: Patient: at home Provider: at office   I discussed the limitations of evaluation and management by telemedicine and the availability of in person appointments. The patient expressed understanding and agreed to proceed.  History of Present Illness: Here with concern for recent wt loss   Wt Readings from Last 3 Encounters:  03/05/19 131 lb (59.4 kg)  02/25/19 131 lb (59.4 kg)  10/22/18 147 lb (66.7 kg)  Pt did change his diet with less carbs and chol/fats and sugar but was concerned the wt dropped so much.  Denies worsening reflux, abd pain, dysphagia, n/v, bowel change or blood.   Pt denies fever, night sweats, loss of appetite, or other constitutional symptoms.  Pt denies chest pain, increased sob or doe, wheezing, orthopnea, PND, increased LE swelling, palpitations, dizziness or syncope.   Pt denies polydipsia, polyuria, Denies hyper or hypo thyroid symptoms such as voice, skin or hair change.  Denies worsening depressive symptoms, suicidal ideation, or panic.  Non smoker, no illicit drug use or incresed ETOH or inflammatory or degenerative chronic illness.  BP at home average 130/80 Past Medical History:  Diagnosis Date  . Abdominal pain, unspecified site 05/30/2014  . Allergic rhinitis 04/08/2007   Qualifier: Diagnosis of  By: [name] MD, [name]   . Allergy   . Arthritis    lumbar spine  . BACK PAIN, LUMBAR 07/10/2008   Qualifier: Diagnosis of  By: [name] MD, [name]   . BENIGN PROSTATIC HYPERTROPHY 08/05/2007   Qualifier: Diagnosis of  By: [name] MD, [name]   . BPH (benign prostatic hypertrophy)   . Cataract    starting- mild   . Cervical radiculopathy 08/01/2017  . COLONIC POLYPS, HX  OF 08/05/2007   Adenoma polpy per last coonscopy Sep 01, 2006, Dr Henrene Pastor   . Cough 11/12/2009   Qualifier: Diagnosis of  By: [name] MD, Arvid Right.   . Depression 04/08/2007   Qualifier: Diagnosis of  By: [name] MD, [name]   . Diverticulosis of colon   . DIVERTICULOSIS, COLON 08/05/2007   Qualifier: Diagnosis of  By: [name] MD, [place], ATOPIC 11/12/2009   Qualifier: Diagnosis of  By: [name] MD, Arvid Right.   . HEMORRHOIDS 07/10/2008   Qualifier: Diagnosis of  By: [name] MD, [name]   . History of nephrolithiasis   . History of shingles   . Hx of colonic polyp   . Hyperglycemia 11/19/2014  . Hyperlipidemia   . Hypertension    some past history   . Insomnia   . Insomnia, unspecified 04/08/2007   Centricity Description: SYMPTOM, INSOMNIA NOS Qualifier: Diagnosis of  By: [name] MD, [name]  Centricity Description: INSOMNIA-SLEEP DISORDER-UNSPEC Qualifier: Diagnosis of  By: [name] MD, [place], LEFT 07/10/2008   Qualifier: Diagnosis of  By: [name] MD, [name]   . Libido, decreased 10/04/2011  . Loss of weight 05/30/2014  . Multinodular goiter 12/14/2016  . Nausea in adult patient 05/30/2014  . NEPHROLITHIASIS, HX OF 08/05/2007   Qualifier: Diagnosis of  By: [name] MD, [name]   . RENAL INSUFFICIENCY 09/07/2010   Qualifier: Diagnosis of  By:  Reshard Guillet MD, [name]   . Right leg pain 10/04/2018  . Stroke ([place]) 10/2014  . TIA (transient ischemic attack)   . Vitiligo 10/31/2012   Past Surgical History:  Procedure Laterality Date  . CERVICAL DISC ARTHROPLASTY  12/08/2017  . COLONOSCOPY    . CYSTOSCOPY WITH URETEROSCOPY AND STENT PLACEMENT Left 07/04/2014   Procedure: CYSTOSCOPY WITH LEFT RETROGRADE PYLEOGRAM, LEFT URETERAL DILATION AND URETEROSCOPY, LASER LITHOTRISPY AND STENT PLACEMENT;  Surgeon: [name], MD;  Location: [facility];  Service: Urology;  Laterality: Left;  . HOLMIUM LASER APPLICATION N/A 56/10/1306   Procedure:  LASER LITHOTRIPSY;  Surgeon: [name], MD;  Location: [facility];   Service: Urology;  Laterality: N/A;  . lithotrypsy     x 3  . POLYPECTOMY    . TONSILLECTOMY      reports that he has quit smoking. He has never used smokeless tobacco. He reports current alcohol use of about 7.0 standard drinks of alcohol per week. He reports that he does not use drugs. family history includes CAD in his father and mother; Colitis in his sister; Heart disease in an other family member; Heart failure in his father, maternal grandfather, and mother; Hyperlipidemia in an other family member; Hypertension in an other family member; Lupus in his paternal grandfather; Ulcerative colitis in his sister. Allergies  Allergen Reactions  . Crestor [Rosuvastatin]     REACTION: myalgia  . Lipitor [Atorvastatin]     REACTION: myalgia  . Zocor [Simvastatin]     REACTION: myalgia  . Adhesive [Tape] Rash   Current Outpatient Medications on File Prior to Visit  Medication Sig Dispense Refill  . aspirin 81 MG tablet Take 81 mg by mouth daily.    . bisacodyl (DULCOLAX) 5 MG EC tablet Take 5 mg by mouth daily as needed.    . cetirizine (ZYRTEC) 10 MG tablet Take 10 mg by mouth daily as needed for allergies.    Marland Kitchen co-enzyme Q-10 30 MG capsule Take 100 mg by mouth as needed.     . docusate sodium (COLACE) 100 MG capsule Take 100 mg by mouth daily as needed for mild constipation.    . fluticasone (FLONASE) 50 MCG/ACT nasal spray USE 2 SPRAYS IN EACH  NOSTRIL DAILY 48 g 1  . losartan-hydrochlorothiazide (HYZAAR) 100-12.5 MG tablet Take 1 tablet by mouth as needed.    . Melatonin 5 MG TABS Take 1 tablet by mouth as needed.     . metroNIDAZOLE (METROGEL) 1 % gel APPLY TO AFFECTED AREA(S)  TOPICALLY DAILY AS NEEDED  FOR ROSACEA 60 g 2  . Naphazoline-Pheniramine (OPCON-A OP) Apply 1 drop to eye as needed.     Marland Kitchen OVER THE COUNTER MEDICATION Trace mineral supplement daily- 10 drops    . psyllium (METAMUCIL) 58.6 % powder Take 1 packet by mouth daily.    . rosuvastatin (CRESTOR) 20 MG tablet Take 1  tablet (20 mg total) by mouth daily. 90 tablet 3  . Vitamin D, Cholecalciferol, 10 MCG (400 UNIT) CAPS Take 1 capsule by mouth daily.     No current facility-administered medications on file prior to visit.    Observations/Objective: Alert, NAD, appropriate mood and affect, resps normal, cn 2-12 intact, moves all 4s, no visible rash or swelling Lab Results  Component Value Date   WBC 6.1 10/01/2018   HGB 14.4 10/01/2018   HCT 41.7 10/01/2018   PLT 223.0 10/01/2018   GLUCOSE 101 (H) 10/01/2018   CHOL 254 (H) 03/05/2019  TRIG 41 03/05/2019   HDL 101 03/05/2019   LDLDIRECT 206.1 11/01/2013   LDLCALC 145 (H) 03/05/2019   ALT 14 03/05/2019   AST 22 03/05/2019   NA 141 10/01/2018   K 4.0 10/01/2018   CL 102 10/01/2018   CREATININE 1.60 (H) 03/06/2019   BUN 16 10/01/2018   CO2 30 10/01/2018   TSH 1.88 10/01/2018   PSA 1.45 10/01/2018   HGBA1C 5.6 12/23/2014   Assessment and Plan: See notes  Follow Up Instructions: See notes   I discussed the assessment and treatment plan with the patient. The patient was provided an opportunity to ask questions and all were answered. The patient agreed with the plan and demonstrated an understanding of the instructions.   The patient was advised to call back or seek an in-person evaluation if the symptoms worsen or if the condition fails to improve as anticipated   Cathlean Cower, MD

## 2019-03-07 NOTE — Assessment & Plan Note (Signed)
stable overall by history and exam, recent data reviewed with pt, and pt to continue medical treatment as before,  to f/u any worsening symptoms or concerns  

## 2019-03-07 NOTE — Patient Instructions (Signed)
Please continue all other medications as before, and refills have been done if requested.  Please have the pharmacy call with any other refills you may need.  Please keep your appointments with your specialists as you may have planned  Please go to the XRAY Department in the Basement (go straight as you get off the elevator) for the x-ray testing at your convenience  You will be contacted by phone if any changes need to be made immediately.  Otherwise, you will receive a letter about your results with an explanation, but please check with MyChart first.  Please remember to sign up for MyChart if you have not done so, as this will be important to you in the future with finding out test results, communicating by private email, and scheduling acute appointments online when needed.  We may need to consider GI follow up for EGD if you have further wt loss

## 2019-03-07 NOTE — Telephone Encounter (Signed)
-----   Message from Sueanne Margarita, MD sent at 03/06/2019  7:01 PM EDT ----- Please see telephone note about referral to lipid clininc

## 2019-03-08 ENCOUNTER — Other Ambulatory Visit: Payer: Self-pay

## 2019-03-08 ENCOUNTER — Ambulatory Visit (INDEPENDENT_AMBULATORY_CARE_PROVIDER_SITE_OTHER)
Admission: RE | Admit: 2019-03-08 | Discharge: 2019-03-08 | Disposition: A | Discharge status: Home / Self Care | Payer: 59 | Source: Ambulatory Visit | Attending: Internal Medicine | Admitting: Internal Medicine

## 2019-03-08 DIAGNOSIS — R634 Abnormal weight loss: Secondary | ICD-10-CM

## 2019-03-12 ENCOUNTER — Telehealth: Payer: Self-pay | Admitting: Internal Medicine

## 2019-03-12 ENCOUNTER — Encounter: Payer: Self-pay | Admitting: Internal Medicine

## 2019-03-12 DIAGNOSIS — N183 Chronic kidney disease, stage 3 unspecified: Secondary | ICD-10-CM

## 2019-03-12 NOTE — Telephone Encounter (Signed)
Jerry Rios the pt is calling for results of the MRI, you reviewed?

## 2019-03-12 NOTE — Telephone Encounter (Signed)
Pt called regarding results of MRI.

## 2019-03-12 NOTE — Telephone Encounter (Signed)
Patient is advised.  

## 2019-03-12 NOTE — Telephone Encounter (Signed)
Please call  Pt and let him know the MRI of liver looks good - the tiny lesion seen on previous Ct scan is shown to be a hemangioma  Which is a benign vascular  Lesion, it is still less than a cm in size -  ,And nothing to worry about . Which is good news!

## 2019-03-28 ENCOUNTER — Telehealth: Payer: Self-pay

## 2019-03-28 NOTE — Telephone Encounter (Signed)

## 2019-03-28 NOTE — Telephone Encounter (Signed)
lmom for prescreen  

## 2019-04-04 ENCOUNTER — Ambulatory Visit (INDEPENDENT_AMBULATORY_CARE_PROVIDER_SITE_OTHER): Payer: 59 | Admitting: Pharmacist

## 2019-04-04 ENCOUNTER — Other Ambulatory Visit: Payer: Self-pay

## 2019-04-04 DIAGNOSIS — E7800 Pure hypercholesterolemia, unspecified: Secondary | ICD-10-CM

## 2019-04-04 MED ORDER — LIVALO 2 MG PO TABS: 1.0000 | ORAL_TABLET | Freq: Every day | ORAL | 11 refills | Status: DC

## 2019-04-04 MED ORDER — ROSUVASTATIN CALCIUM 5 MG PO TABS: 5.0000 mg | ORAL_TABLET | Freq: Every day | ORAL | 3 refills | Status: DC

## 2019-04-04 MED ORDER — PRALUENT 75 MG/ML ~~LOC~~ SOAJ: 1.0000 "pen " | SUBCUTANEOUS | 3 refills | Status: DC

## 2019-04-04 NOTE — Patient Instructions (Addendum)
It was nice to see you today  Your LDL is currently 145 and your goal is < 70  Decrease your rosuvastatin (Crestor) to 5mg  daily  I will submit paperwork for one of the cholesterol shots (either Praluent or Repatha) to start taking in addition to your Crestor  We will recheck fasting lab work on Friday, September 4th

## 2019-04-04 NOTE — Progress Notes (Addendum)
Patient ID: Jerry Rios                 DOB: June 03, 1951                    MRN: [id number]     HPI: Jerry Rios is a 68 y.o. male patient referred to lipid clinic by Dr Radford Pax. PMH is significant for CVA, HTN, and elevated calcium score of 684 with calcium noted in all 3 coronary arteries and LM. He has a history of statin intolerance and presents for further management.  Patient presents today in good spirits. He has tried multiple statins in the past including Crestor, Lipitor, Zocor, pravastatin, and Livalo. He experienced muscle pain on most statins - he did tolerate Livalo well but his insurance stopped covering it. Most recently, he has been taking Crestor 20mg  daily, however he has been noting muscle pain and weakness with this dose as well. He would be ok with restarting Livalo if affordable - checked with insurance company and is $25/month.  He has done a lot of research on how to prevent insulin resistance - reports that at his annual physical with his PCP this year, his fasting sugar was elevated for the first time. He has tried to cut out white carbs completely and has cut back on sugar this year.   Current Medications: rosuvastatin 20mg  daily, psyllium powder Intolerances: Crestor, Lipitor, Zocor, pravastatin 20mg  and 40mg , Livalo 2mg , Welchol - myalgias, Zetia - ineffective Risk Factors: stroke, HTN, elevated calcium score, family history of premature CAD LDL goal: 70mg /dL  Diet: Cut out white carbs in 2016, cut back on sugar this year.  Exercise: Used to be a runner - quit 2 years ago due to spinal problems  Family History: Mom died from MI at age 57, dad died from cardiac arrest at 66  Social History: Former smoker for 10 years, quit at age 40  Labs: 03/05/19: TC 254, TG 41, HDL 101, LDL 145 (rosuvastatin 20mg  daily)  Past Medical History:  Diagnosis Date  . Abdominal pain, unspecified site 05/30/2014  . Allergic rhinitis 04/08/2007   Qualifier: Diagnosis of   By: [name] MD, [name]   . Allergy   . Arthritis    lumbar spine  . BACK PAIN, LUMBAR 07/10/2008   Qualifier: Diagnosis of  By: [name] MD, [name]   . BENIGN PROSTATIC HYPERTROPHY 08/05/2007   Qualifier: Diagnosis of  By: [name] MD, [name]   . BPH (benign prostatic hypertrophy)   . Cataract    starting- mild   . Cervical radiculopathy 08/01/2017  . COLONIC POLYPS, HX OF 08/05/2007   Adenoma polpy per last coonscopy Sep 01, 2006, Dr Henrene Pastor   . Cough 11/12/2009   Qualifier: Diagnosis of  By: [name] MD, Arvid Right.   . Depression 04/08/2007   Qualifier: Diagnosis of  By: [name] MD, [name]   . Diverticulosis of colon   . DIVERTICULOSIS, COLON 08/05/2007   Qualifier: Diagnosis of  By: [name] MD, [place], ATOPIC 11/12/2009   Qualifier: Diagnosis of  By: [name] MD, Arvid Right.   . HEMORRHOIDS 07/10/2008   Qualifier: Diagnosis of  By: [name] MD, [name]   . History of nephrolithiasis   . History of shingles   . Hx of colonic polyp   . Hyperglycemia 11/19/2014  . Hyperlipidemia   . Hypertension    some past history   . Insomnia   . Insomnia, unspecified 04/08/2007  Centricity Description: SYMPTOM, INSOMNIA NOS Qualifier: Diagnosis of  By: [name] MD, [name]  Centricity Description: INSOMNIA-SLEEP DISORDER-UNSPEC Qualifier: Diagnosis of  By: [name] MD, [place], LEFT 07/10/2008   Qualifier: Diagnosis of  By: [name] MD, [name]   . Libido, decreased 10/04/2011  . Loss of weight 05/30/2014  . Multinodular goiter 12/14/2016  . Nausea in adult patient 05/30/2014  . NEPHROLITHIASIS, HX OF 08/05/2007   Qualifier: Diagnosis of  By: [name] MD, [name]   . RENAL INSUFFICIENCY 09/07/2010   Qualifier: Diagnosis of  By: [name] MD, [name]   . Right leg pain 10/04/2018  . Stroke ([place]) 10/2014  . TIA (transient ischemic attack)   . Vitiligo 10/31/2012    Current Outpatient Medications on File Prior to Visit  Medication Sig Dispense Refill  . aspirin 81 MG tablet Take 81 mg by mouth daily.    . bisacodyl  (DULCOLAX) 5 MG EC tablet Take 5 mg by mouth daily as needed.    . cetirizine (ZYRTEC) 10 MG tablet Take 10 mg by mouth daily as needed for allergies.    Marland Kitchen co-enzyme Q-10 30 MG capsule Take 100 mg by mouth as needed.     . docusate sodium (COLACE) 100 MG capsule Take 100 mg by mouth daily as needed for mild constipation.    . fluticasone (FLONASE) 50 MCG/ACT nasal spray USE 2 SPRAYS IN EACH  NOSTRIL DAILY 48 g 1  . losartan-hydrochlorothiazide (HYZAAR) 100-12.5 MG tablet Take 1 tablet by mouth as needed.    . Melatonin 5 MG TABS Take 1 tablet by mouth as needed.     . metroNIDAZOLE (METROGEL) 1 % gel APPLY TO AFFECTED AREA(S)  TOPICALLY DAILY AS NEEDED  FOR ROSACEA 60 g 2  . Naphazoline-Pheniramine (OPCON-A OP) Apply 1 drop to eye as needed.     Marland Kitchen OVER THE COUNTER MEDICATION Trace mineral supplement daily- 10 drops    . psyllium (METAMUCIL) 58.6 % powder Take 1 packet by mouth daily.    . rosuvastatin (CRESTOR) 20 MG tablet Take 1 tablet (20 mg total) by mouth daily. 90 tablet 3  . Vitamin D, Cholecalciferol, 10 MCG (400 UNIT) CAPS Take 1 capsule by mouth daily.     No current facility-administered medications on file prior to visit.     Allergies  Allergen Reactions  . Crestor [Rosuvastatin]     REACTION: myalgia  . Lipitor [Atorvastatin]     REACTION: myalgia  . Zocor [Simvastatin]     REACTION: myalgia  . Adhesive [Tape] Rash    Assessment/Plan:  1. Hyperlipidemia - LDL 145mg /dL on rosuvastatin 20mg  daily above goal < 70 due to hx of ASCVD. Pt is experiencing muscle aches on higher dose of rosuvastatin. Will decrease dose back to 5mg  daily (Livalo copay more expensive so he prefers to stay on rosuvastatin). Will also submit prior authorization for Praluent. Injection technique reviewed in clinic today, he will qualify for $0 copay card. Will recheck lipids in 3 months - pt would like insulin resistance checked so we'll check an advanced lipid panel which includes insulin sensitivity,  as well as CRP per patient request.    E. Supple, PharmD, BCACP, [facility] [address] [place], [place] [address] Phone: [phone]; Fax: [phone] 04/04/2019 9:40 AM  Addendum: Praluent PA approved through 04/03/20. Copay card activated and sent to pharmacy with rx.

## 2019-04-28 ENCOUNTER — Encounter: Payer: Self-pay | Admitting: Internal Medicine

## 2019-05-02 ENCOUNTER — Telehealth: Payer: Self-pay | Admitting: Internal Medicine

## 2019-05-02 NOTE — Telephone Encounter (Signed)
Pt states he has been having abd pain associated with eating or not eating, constipation, not sleeping, and abd bloating. Pt states she has been having these issues since December and he was seen by Nicoletta Ba PA and some tests were done but nothing was found. Pt states he has googled his symptoms and he thinks he may have SIBO. Is interested in having SIBO testing done. Discussed with pt that Dr. Henrene Pastor is out of the office but will check when him and call pt back once Dr. Henrene Pastor returns. Pt ok with this plan. Please advise.

## 2019-05-07 NOTE — Telephone Encounter (Signed)
Spoke with pt he wants to wait on the xifaxan and an OV. States Amy Rockwell PA ordered the SIBO testing for him and he just mailed it back in yesterday. He states he wants to wait on the results.

## 2019-05-07 NOTE — Telephone Encounter (Signed)
No practical tests for SIBO but we can empirically treat, see how he does, and arrange follow up.  1. Xifaxan 550 mg tid for 2 weeks (# 42); no refills 2. Office follow up with me 4-6 weeks  Thanks, JP

## 2019-05-16 ENCOUNTER — Telehealth: Payer: Self-pay | Admitting: Internal Medicine

## 2019-05-16 NOTE — Telephone Encounter (Signed)
Amy I think you ordered this test.

## 2019-05-30 ENCOUNTER — Telehealth: Payer: Self-pay

## 2019-05-30 NOTE — Telephone Encounter (Signed)
Patient is notified. He agrees to the plan of care and expresses understanding. Samples at the front desk for patient pick up.

## 2019-05-30 NOTE — Telephone Encounter (Signed)
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 I have a bunch of samples we can use  - they ar in my cabinte above my desk - thanks

## 2019-05-31 ENCOUNTER — Other Ambulatory Visit: Payer: Self-pay

## 2019-05-31 ENCOUNTER — Telehealth: Payer: Self-pay | Admitting: Internal Medicine

## 2019-05-31 ENCOUNTER — Other Ambulatory Visit: Payer: 59 | Admitting: *Deleted

## 2019-05-31 DIAGNOSIS — E7800 Pure hypercholesterolemia, unspecified: Secondary | ICD-10-CM

## 2019-05-31 NOTE — Telephone Encounter (Signed)
Dr Henrene Pastor is this ok?

## 2019-05-31 NOTE — Telephone Encounter (Signed)
April it is ok to schedule with Dr Silverio Decamp;

## 2019-05-31 NOTE — Telephone Encounter (Signed)
What's SIBO??? (just kidding).  Okay with me

## 2019-05-31 NOTE — Telephone Encounter (Signed)
Dr Silverio Decamp is this ok with you?

## 2019-05-31 NOTE — Telephone Encounter (Signed)
ok 

## 2019-06-01 LAB — NMR, LIPOPROFILE
Cholesterol, Total: 282 mg/dL — ABNORMAL HIGH (ref 100–199)
HDL Particle Number: 34.9 umol/L (ref >=30.5)
HDL-C: 104 mg/dL (ref >39)
LDL Particle Number: 1427 nmol/L — ABNORMAL HIGH (ref <1000)
LDL Size: 21.8 nm (ref >20.5)
LDL-C (NIH Calc): 170 mg/dL — ABNORMAL HIGH (ref 0–99)
LP-IR Score: <25 (ref <=45)
Small LDL Particle Number: <90 nmol/L (ref <=527)
Triglycerides: 53 mg/dL (ref 0–149)

## 2019-06-01 LAB — C-REACTIVE PROTEIN: CRP: <1 mg/L (ref 0–10)

## 2019-06-01 LAB — HEPATIC FUNCTION PANEL
ALT: 16 IU/L (ref 0–44)
AST: 23 IU/L (ref 0–40)
Albumin: 4.5 g/dL (ref 3.8–4.8)
Alkaline Phosphatase: 28 IU/L — ABNORMAL LOW (ref 39–117)
Bilirubin Total: 0.6 mg/dL (ref 0.0–1.2)
Bilirubin, Direct: 0.15 mg/dL (ref 0.00–0.40)
Total Protein: 6.5 g/dL (ref 6.0–8.5)

## 2019-06-01 LAB — APOLIPOPROTEIN B: Apolipoprotein B: 116 mg/dL — ABNORMAL HIGH (ref <90)

## 2019-06-04 ENCOUNTER — Telehealth: Payer: Self-pay | Admitting: Pharmacist

## 2019-06-04 DIAGNOSIS — E7800 Pure hypercholesterolemia, unspecified: Secondary | ICD-10-CM

## 2019-06-04 NOTE — Telephone Encounter (Signed)
Called pt to discuss lipid results. LDL previously 145 on rosuvastatin 20mg  daily which pt could not tolerate. He was seen in lipid clinic in July when rosuvastatin was decreased to 5mg  daily and Praluent injections were started. LDL has since gone up to 170.  Called pt who reports adherence to rosuvastatin 5mg  daily and Praluent injections Q2W. States he had an issue with his 3rd injection and overall has had trouble injecting the shots into his abdomen because he does not have much fat there. States part of the 3rd injection leaked out. Discussed rotating injection site to his upper outer thigh or the back of his upper arm if he has more fatty tissue there. He would like to come to clinic later this week to review injection technique and plans to change administration of the injections into his leg instead. Will plan to recheck lipids in another 2 months - can discuss increasing Praluent dose if LDL does not respond after injection technique is modified.

## 2019-06-06 NOTE — Telephone Encounter (Signed)
Patient presents for injection of Praluent into his thighs. Patient successfully injected Praluent into his right thigh.

## 2019-08-02 ENCOUNTER — Other Ambulatory Visit: Payer: Self-pay

## 2019-08-02 ENCOUNTER — Other Ambulatory Visit: Payer: 59 | Admitting: *Deleted

## 2019-08-02 DIAGNOSIS — E7800 Pure hypercholesterolemia, unspecified: Secondary | ICD-10-CM

## 2019-08-02 LAB — LIPID PANEL
Chol/HDL Ratio: 2.5 ratio (ref 0.0–5.0)
Cholesterol, Total: 288 mg/dL — ABNORMAL HIGH (ref 100–199)
HDL: 115 mg/dL (ref >39)
LDL Chol Calc (NIH): 166 mg/dL — ABNORMAL HIGH (ref 0–99)
Triglycerides: 51 mg/dL (ref 0–149)
VLDL Cholesterol Cal: 7 mg/dL (ref 5–40)

## 2019-08-02 LAB — HEPATIC FUNCTION PANEL
ALT: 21 IU/L (ref 0–44)
AST: 27 IU/L (ref 0–40)
Albumin: 4.6 g/dL (ref 3.8–4.8)
Alkaline Phosphatase: 37 IU/L — ABNORMAL LOW (ref 39–117)
Bilirubin Total: 0.4 mg/dL (ref 0.0–1.2)
Bilirubin, Direct: 0.13 mg/dL (ref 0.00–0.40)
Total Protein: 6.9 g/dL (ref 6.0–8.5)

## 2019-08-05 ENCOUNTER — Telehealth: Payer: Self-pay | Admitting: Pharmacist

## 2019-08-05 NOTE — Telephone Encounter (Signed)
Called pt to discuss lipid results. LDL in June ws 145 on rosuvastatin 20mg  daily. He was seen in July in lipid clinic - rosuvastatin dose was decreased to 5mg  daily for tolerability and Praluent 75mg  Q2W was started.   Follow up labs in September showed LDL even higher at 170, pt stated he was having trouble with injections into his abdomen due to lack of fat. He came to clinic and injection technique was reviewed and pt advised to start injecting Praluent into the fatty tissue in his upper thigh.  Follow up lipid panel now showing LDL unchanged at 166.  Left message for pt to discuss if he is using Praluent correctly as his LDL has not changed. Will also clarify if he is still taking rosuvastatin. May need to try changing to [place] if he reports compliance with Praluent and is not responding to therapy.

## 2019-08-05 NOTE — Telephone Encounter (Signed)
Pt returned call to clinic, spent 20 minutes on the phone with pt discussing his lab results and cholesterol management. He reports adherence with rosuvastatin 5mg  daily and Praluent 75mg  Q2W. He states he is eating a very low carb diet which may be affecting his cholesterol. Still would expect to see some response with PCSK9i therapy.   Discussed increasing Praluent dose or trying Repatha. Pt is willing to try Repatha to see if he responds to therapy better. Will plan to activate $5 copay card and recheck lipids in 2 months once prior authorization is approved.

## 2019-08-08 MED ORDER — REPATHA SURECLICK 140 MG/ML ~~LOC~~ SOAJ: 1.0000 "pen " | SUBCUTANEOUS | 11 refills | Status: DC

## 2019-08-08 NOTE — Telephone Encounter (Addendum)
Repatha prior authorization approved through 02/04/20. Rx sent to pharmacy. Attempted to activate copay card for pt but was unable to (likely because pt is over 65) - he will need to call Repatha Ready at [phone] to get card activated. Copay without card is $50. Pt is aware to call Repatha Ready to activate copay card before picking up rx.

## 2019-09-30 ENCOUNTER — Other Ambulatory Visit: Payer: Self-pay | Admitting: *Deleted

## 2019-09-30 ENCOUNTER — Encounter: Payer: Self-pay | Admitting: Internal Medicine

## 2019-09-30 ENCOUNTER — Telehealth: Payer: Self-pay | Admitting: Internal Medicine

## 2019-09-30 DIAGNOSIS — Z0001 Encounter for general adult medical examination with abnormal findings: Secondary | ICD-10-CM

## 2019-09-30 NOTE — Telephone Encounter (Signed)
Patient would like to have labs done prior to his visit with Dr [name] on 10/08/2019. Can these be entered for him? He would like to have these done in the morning.

## 2019-10-01 NOTE — Telephone Encounter (Signed)
Done per Vicente Males

## 2019-10-03 ENCOUNTER — Other Ambulatory Visit (INDEPENDENT_AMBULATORY_CARE_PROVIDER_SITE_OTHER): Payer: 59

## 2019-10-03 DIAGNOSIS — Z0001 Encounter for general adult medical examination with abnormal findings: Secondary | ICD-10-CM

## 2019-10-03 LAB — HEPATIC FUNCTION PANEL
ALT: 20 U/L (ref 0–53)
AST: 27 U/L (ref 0–37)
Albumin: 4.2 g/dL (ref 3.5–5.2)
Alkaline Phosphatase: 28 U/L — ABNORMAL LOW (ref 39–117)
Bilirubin, Direct: 0.1 mg/dL (ref 0.0–0.3)
Total Bilirubin: 0.7 mg/dL (ref 0.2–1.2)
Total Protein: 6.4 g/dL (ref 6.0–8.3)

## 2019-10-03 LAB — BASIC METABOLIC PANEL
BUN: 21 mg/dL (ref 6–23)
CO2: 30 mEq/L (ref 19–32)
Calcium: 9.5 mg/dL (ref 8.4–10.5)
Chloride: 101 mEq/L (ref 96–112)
Creatinine, Ser: 1.38 mg/dL (ref 0.40–1.50)
GFR: 51.07 mL/min — ABNORMAL LOW (ref >60.00)
Glucose, Bld: 98 mg/dL (ref 70–99)
Potassium: 3.7 mEq/L (ref 3.5–5.1)
Sodium: 139 mEq/L (ref 135–145)

## 2019-10-03 LAB — CBC WITH DIFFERENTIAL/PLATELET
Basophils Absolute: 0 10*3/uL (ref 0.0–0.1)
Basophils Relative: 0.6 % (ref 0.0–3.0)
Eosinophils Absolute: 0.1 10*3/uL (ref 0.0–0.7)
Eosinophils Relative: 2.1 % (ref 0.0–5.0)
HCT: 38.9 % — ABNORMAL LOW (ref 39.0–52.0)
Hemoglobin: 13.2 g/dL (ref 13.0–17.0)
Lymphocytes Relative: 38.4 % (ref 12.0–46.0)
Lymphs Abs: 2.1 10*3/uL (ref 0.7–4.0)
MCHC: 34 g/dL (ref 30.0–36.0)
MCV: 93.2 fl (ref 78.0–100.0)
Monocytes Absolute: 0.5 10*3/uL (ref 0.1–1.0)
Monocytes Relative: 9.2 % (ref 3.0–12.0)
Neutro Abs: 2.7 10*3/uL (ref 1.4–7.7)
Neutrophils Relative %: 49.7 % (ref 43.0–77.0)
Platelets: 196 10*3/uL (ref 150.0–400.0)
RBC: 4.17 Mil/uL — ABNORMAL LOW (ref 4.22–5.81)
RDW: 14 % (ref 11.5–15.5)
WBC: 5.5 10*3/uL (ref 4.0–10.5)

## 2019-10-03 LAB — URINALYSIS, ROUTINE W REFLEX MICROSCOPIC
Bilirubin Urine: NEGATIVE
Hgb urine dipstick: NEGATIVE
Ketones, ur: NEGATIVE
Leukocytes,Ua: NEGATIVE
Nitrite: NEGATIVE
RBC / HPF: NONE SEEN (ref 0–?)
Specific Gravity, Urine: 1.01 (ref 1.000–1.030)
Total Protein, Urine: NEGATIVE
Urine Glucose: NEGATIVE
Urobilinogen, UA: 0.2 (ref 0.0–1.0)
pH: 6 (ref 5.0–8.0)

## 2019-10-03 LAB — LIPID PANEL
Cholesterol: 261 mg/dL — ABNORMAL HIGH (ref 0–200)
HDL: 104 mg/dL (ref >39.00)
LDL Cholesterol: 147 mg/dL — ABNORMAL HIGH (ref 0–99)
NonHDL: 157.07
Total CHOL/HDL Ratio: 3
Triglycerides: 48 mg/dL (ref 0.0–149.0)
VLDL: 9.6 mg/dL (ref 0.0–40.0)

## 2019-10-03 LAB — PSA: PSA: 1.66 ng/mL (ref 0.10–4.00)

## 2019-10-03 LAB — TSH: TSH: 1.52 u[IU]/mL (ref 0.35–4.50)

## 2019-10-08 ENCOUNTER — Ambulatory Visit (INDEPENDENT_AMBULATORY_CARE_PROVIDER_SITE_OTHER): Payer: 59 | Admitting: Internal Medicine

## 2019-10-08 ENCOUNTER — Encounter: Payer: Self-pay | Admitting: Internal Medicine

## 2019-10-08 ENCOUNTER — Other Ambulatory Visit: Payer: Self-pay

## 2019-10-08 VITALS — BP 116/72 | HR 56 | Temp 98.2°F | Resp 12 | Ht 67.0 in | Wt 134.8 lb

## 2019-10-08 DIAGNOSIS — Z23 Encounter for immunization: Secondary | ICD-10-CM

## 2019-10-08 DIAGNOSIS — Z0000 Encounter for general adult medical examination without abnormal findings: Secondary | ICD-10-CM | POA: Diagnosis not present

## 2019-10-08 DIAGNOSIS — R739 Hyperglycemia, unspecified: Secondary | ICD-10-CM | POA: Diagnosis not present

## 2019-10-08 DIAGNOSIS — E8881 Metabolic syndrome: Secondary | ICD-10-CM

## 2019-10-08 NOTE — Assessment & Plan Note (Signed)

## 2019-10-08 NOTE — Addendum Note (Signed)
Addended by: Otilio Miu on: 10/08/2019 04:51 PM   Modules accepted: Orders

## 2019-10-08 NOTE — Assessment & Plan Note (Signed)
To endo per pt request

## 2019-10-08 NOTE — Progress Notes (Signed)
Subjective:    Patient ID: Jerry Rios, male    DOB: 1951/01/31, 69 y.o.   MRN: [id number]  HPI.  Here for wellness and f/u;  Overall doing ok;  Pt denies Chest pain, worsening SOB, DOE, wheezing, orthopnea, PND, worsening LE edema, palpitations, dizziness or syncope.  Pt denies neurological change such as new headache, facial or extremity weakness.  Pt denies polydipsia, polyuria, or low sugar symptoms. Pt states overall good compliance with treatment and medications, good tolerability, and has been trying to follow appropriate diet.  Pt denies worsening depressive symptoms, suicidal ideation or panic. No fever, night sweats, wt loss, loss of appetite, or other constitutional symptoms.  Pt states good ability with ADL's, has low fall risk, home safety reviewed and adequate, no other significant changes in hearing or vision, and only occasionally active with exercise. BP has been labile with occasional dizziness as well. No new complaints Past Medical History:  Diagnosis Date  . Abdominal pain, unspecified site 05/30/2014  . Allergic rhinitis 04/08/2007   Qualifier: Diagnosis of  By: [name] MD, [name]   . Allergy   . Arthritis    lumbar spine  . BACK PAIN, LUMBAR 07/10/2008   Qualifier: Diagnosis of  By: [name] MD, [name]   . BENIGN PROSTATIC HYPERTROPHY 08/05/2007   Qualifier: Diagnosis of  By: [name] MD, [name]   . BPH (benign prostatic hypertrophy)   . Cataract    starting- mild   . Cervical radiculopathy 08/01/2017  . COLONIC POLYPS, HX OF 08/05/2007   Adenoma polpy per last coonscopy Sep 01, 2006, Dr Henrene Pastor   . Cough 11/12/2009   Qualifier: Diagnosis of  By: [name] MD, Arvid Right.   . Depression 04/08/2007   Qualifier: Diagnosis of  By: [name] MD, [name]   . Diverticulosis of colon   . DIVERTICULOSIS, COLON 08/05/2007   Qualifier: Diagnosis of  By: [name] MD, [place], ATOPIC 11/12/2009   Qualifier: Diagnosis of  By: [name] MD, Arvid Right.   . HEMORRHOIDS 07/10/2008   Qualifier:  Diagnosis of  By: [name] MD, [name]   . History of nephrolithiasis   . History of shingles   . Hx of colonic polyp   . Hyperglycemia 11/19/2014  . Hyperlipidemia   . Hypertension    some past history   . Insomnia   . Insomnia, unspecified 04/08/2007   Centricity Description: SYMPTOM, INSOMNIA NOS Qualifier: Diagnosis of  By: [name] MD, [name]  Centricity Description: INSOMNIA-SLEEP DISORDER-UNSPEC Qualifier: Diagnosis of  By: [name] MD, [place], LEFT 07/10/2008   Qualifier: Diagnosis of  By: [name] MD, [name]   . Libido, decreased 10/04/2011  . Loss of weight 05/30/2014  . Multinodular goiter 12/14/2016  . Nausea in adult patient 05/30/2014  . NEPHROLITHIASIS, HX OF 08/05/2007   Qualifier: Diagnosis of  By: [name] MD, [name]   . RENAL INSUFFICIENCY 09/07/2010   Qualifier: Diagnosis of  By: [name] MD, [name]   . Right leg pain 10/04/2018  . Stroke ([place]) 10/2014  . TIA (transient ischemic attack)   . Vitiligo 10/31/2012   Past Surgical History:  Procedure Laterality Date  . CERVICAL DISC ARTHROPLASTY  12/08/2017  . COLONOSCOPY    . CYSTOSCOPY WITH URETEROSCOPY AND STENT PLACEMENT Left 07/04/2014   Procedure: CYSTOSCOPY WITH LEFT RETROGRADE PYLEOGRAM, LEFT URETERAL DILATION AND URETEROSCOPY, LASER LITHOTRISPY AND STENT PLACEMENT;  Surgeon: [name], MD;  Location: [facility];  Service: Urology;  Laterality: Left;  . HOLMIUM LASER APPLICATION N/A [id number]   Procedure:  LASER LITHOTRIPSY;  Surgeon: [name], MD;  Location: [facility];  Service: Urology;  Laterality: N/A;  . lithotrypsy     x 3  . POLYPECTOMY    . TONSILLECTOMY      reports that he has quit smoking. He has never used smokeless tobacco. He reports previous alcohol use of about 7.0 standard drinks of alcohol per week. He reports that he does not use drugs. family history includes CAD in his father and mother; Colitis in his sister; Heart disease in an other family member; Heart failure in his father, maternal  grandfather, and mother; Hyperlipidemia in an other family member; Hypertension in an other family member; Lupus in his paternal grandfather; Ulcerative colitis in his sister. Allergies  Allergen Reactions  . Crestor [Rosuvastatin]     REACTION: myalgia  . Lipitor [Atorvastatin]     REACTION: myalgia  . Zocor [Simvastatin]     REACTION: myalgia  . Adhesive [Tape] Rash   Current Outpatient Medications on File Prior to Visit  Medication Sig Dispense Refill  . bisacodyl (DULCOLAX) 5 MG EC tablet Take 5 mg by mouth daily as needed.    . docusate sodium (COLACE) 100 MG capsule Take 100 mg by mouth daily as needed for mild constipation.    . Evolocumab (REPATHA SURECLICK) [id number] MG/ML SOAJ Inject 1 pen into the skin every 14 (fourteen) days. 2 pen 11  . losartan-hydrochlorothiazide (HYZAAR) 100-12.5 MG tablet Take 1 tablet by mouth as needed.    . metroNIDAZOLE (METROGEL) 1 % gel APPLY TO AFFECTED AREA(S)  TOPICALLY DAILY AS NEEDED  FOR ROSACEA 60 g 2  . Naphazoline-Pheniramine (OPCON-A OP) Apply 1 drop to eye as needed.     Marland Kitchen OVER THE COUNTER MEDICATION Trace mineral supplement daily- 10 drops    . psyllium (METAMUCIL) 58.6 % powder Take 1 packet by mouth daily.    . rosuvastatin (CRESTOR) 5 MG tablet Take 1 tablet (5 mg total) by mouth daily. 90 tablet 3  . Vitamin D, Cholecalciferol, 10 MCG (400 UNIT) CAPS Take 1 capsule by mouth daily.    Marland Kitchen aspirin 81 MG tablet Take 81 mg by mouth daily.    . cetirizine (ZYRTEC) 10 MG tablet Take 10 mg by mouth daily as needed for allergies.    Marland Kitchen co-enzyme Q-10 30 MG capsule Take 100 mg by mouth as needed.     . fluticasone (FLONASE) 50 MCG/ACT nasal spray USE 2 SPRAYS IN EACH  NOSTRIL DAILY (Patient not taking: Reported on 10/08/2019) 48 g 1  . Melatonin 5 MG TABS Take 1 tablet by mouth as needed.      No current facility-administered medications on file prior to visit.   Review of Systems Constitutional: Negative for other unusual diaphoresis, sweats,  appetite or weight changes HENT: Negative for other worsening hearing loss, ear pain, facial swelling, mouth sores or neck stiffness.   Eyes: Negative for other worsening pain, redness or other visual disturbance.  Respiratory: Negative for other stridor or swelling Cardiovascular: Negative for other palpitations or other chest pain  Gastrointestinal: Negative for worsening diarrhea or loose stools, blood in stool, distention or other pain Genitourinary: Negative for hematuria, flank pain or other change in urine volume.  Musculoskeletal: Negative for myalgias or other joint swelling.  Skin: Negative for other color change, or other wound or worsening drainage.  Neurological: Negative for other syncope or numbness. Hematological: Negative  for other adenopathy or swelling Psychiatric/Behavioral: Negative for hallucinations, other worsening agitation, SI, self-injury, or new decreased concentration All otherwise neg per pt      Objective:   Physical Exam BP 116/72   Pulse (!) 56   Temp 98.2 F (36.8 C)   Resp 12   Ht 5\' 7"  (1.702 m)   Wt 134 lb 12.8 oz (61.1 kg)   SpO2 99%   BMI 21.11 kg/m  VS noted,  Constitutional: Pt is oriented to person, place, and time. Appears well-developed and well-nourished, in no significant distress and comfortable Head: Normocephalic and atraumatic  Eyes: Conjunctivae and EOM are normal. Pupils are equal, round, and reactive to light Right Ear: External ear normal without discharge Left Ear: External ear normal without discharge Nose: Nose without discharge or deformity Mouth/Throat: Oropharynx is without other ulcerations and moist  Neck: Normal range of motion. Neck supple. No JVD present. No tracheal deviation present or significant neck LA or mass Cardiovascular: Normal rate, regular rhythm, normal heart sounds and intact distal pulses.   Pulmonary/Chest: WOB normal and breath sounds without rales or wheezing  Abdominal: Soft. Bowel sounds are  normal. NT. No HSM  Musculoskeletal: Normal range of motion. Exhibits no edema Lymphadenopathy: Has no other cervical adenopathy.  Neurological: Pt is alert and oriented to person, place, and time. Pt has normal reflexes. No cranial nerve deficit. Motor grossly intact, Gait intact Skin: Skin is warm and dry. No rash noted or new ulcerations Psychiatric:  Has normal mood and affect. Behavior is normal without agitation All otherwise neg per pt Lab Results  Component Value Date   WBC 5.5 10/03/2019   HGB 13.2 10/03/2019   HCT 38.9 (L) 10/03/2019   PLT 196.0 10/03/2019   GLUCOSE 98 10/03/2019   CHOL 261 (H) 10/03/2019   TRIG 48.0 10/03/2019   HDL 104.00 10/03/2019   LDLDIRECT 206.1 11/01/2013   LDLCALC 147 (H) 10/03/2019   ALT 20 10/03/2019   AST 27 10/03/2019   NA 139 10/03/2019   K 3.7 10/03/2019   CL 101 10/03/2019   CREATININE 1.38 10/03/2019   BUN 21 10/03/2019   CO2 30 10/03/2019   TSH 1.52 10/03/2019   PSA 1.66 10/03/2019   HGBA1C 5.6 12/23/2014  '     Assessment & Plan:

## 2019-10-08 NOTE — Patient Instructions (Addendum)
You had the Tdap tetanus shot today  Please continue all other medications as before, and refills have been done if requested.  Please have the pharmacy call with any other refills you may need.  Please continue your efforts at being more active, low cholesterol diet, and weight control.  You are otherwise up to date with prevention measures today.  Please keep your appointments with your specialists as you may have planned  You will be contacted regarding the referral for: Endocrinology  Please return in 1 year for your yearly visit, or sooner if needed, with Lab testing done 3-5 days before at the [facility] - please make appt as you leave today for this

## 2019-10-14 ENCOUNTER — Telehealth: Payer: Self-pay | Admitting: Pharmacist

## 2019-10-14 MED ORDER — ROSUVASTATIN CALCIUM 10 MG PO TABS: 10.0000 mg | ORAL_TABLET | Freq: Every day | ORAL | 3 refills | Status: DC

## 2019-10-14 MED ORDER — NEXLIZET 180-10 MG PO TABS: 1.0000 | ORAL_TABLET | Freq: Every day | ORAL | 3 refills | Status: DC

## 2019-10-14 NOTE — Telephone Encounter (Signed)
Thank you :)

## 2019-10-14 NOTE — Telephone Encounter (Addendum)
Called patient to discuss lipids - he has not responded at all to either Praluent or Repatha. He may have a genetic mutation with his PCSK9 that is making medications ineffective.  He is tolerating rosuvastatin 5mg  daily well and is agreeable to increasing dose to 10mg  daily. Also discussed adding Nexlizet for additional 40% LDL lowering. He states he tried Zetia a long time ago and it didn't work either. Will still try combo pill and follow up with efficacy - his LDL is 147 and goal is < 70 so he needs ~50% LDL lowering. Insurance info through [employer] is as as follows -  ID: [id number] BIN: [id number] GRP: [facility] [id number]  This is a commercial plan so we can use copay card for Nexlizet. Prior authorization submitted and approved. Rx sent to pharmacy with 3 month supply for $10 with copay card. Pt is aware and will call with any tolerability issues.

## 2019-11-12 ENCOUNTER — Other Ambulatory Visit: Payer: Self-pay

## 2019-11-13 ENCOUNTER — Encounter: Payer: Self-pay | Admitting: Endocrinology

## 2019-11-13 ENCOUNTER — Ambulatory Visit: Payer: 59 | Admitting: Endocrinology

## 2019-11-13 VITALS — BP 118/76 | HR 59 | Ht 67.0 in | Wt 136.4 lb

## 2019-11-13 DIAGNOSIS — N289 Disorder of kidney and ureter, unspecified: Secondary | ICD-10-CM | POA: Diagnosis not present

## 2019-11-13 DIAGNOSIS — R739 Hyperglycemia, unspecified: Secondary | ICD-10-CM

## 2019-11-13 DIAGNOSIS — N183 Chronic kidney disease, stage 3 unspecified: Secondary | ICD-10-CM | POA: Insufficient documentation

## 2019-11-13 LAB — POCT GLYCOSYLATED HEMOGLOBIN (HGB A1C): Hemoglobin A1C: 5.3 % (ref 4.0–5.6)

## 2019-11-13 NOTE — Progress Notes (Signed)
Subjective:    Patient ID: Jerry Rios, male    DOB: [birth year], 69 y.o.   MRN: [id number]  HPI Pt is ref by Dr [name], for hyperglycemia.  This was dx'ed with hyperglycemia in 2016.  A1c has been lower since then.  pt states he feels well in general, except for slight cold intolerance.  He has no h/o pancreatitis.  He has never been on DM meds.  He takes intermitt prednisone, for allergic rashes.  Pt says cbg varies from 98-140.   Past Medical History:  Diagnosis Date  . Abdominal pain, unspecified site 05/30/2014  . Allergic rhinitis 04/08/2007   Qualifier: Diagnosis of  By: [name] MD, [name]   . Allergy   . Arthritis    lumbar spine  . BACK PAIN, LUMBAR 07/10/2008   Qualifier: Diagnosis of  By: [name] MD, [name]   . BENIGN PROSTATIC HYPERTROPHY 08/05/2007   Qualifier: Diagnosis of  By: [name] MD, [name]   . BPH (benign prostatic hypertrophy)   . Cataract    starting- mild   . Cervical radiculopathy 08/01/2017  . COLONIC POLYPS, HX OF 08/05/2007   Adenoma polpy per last coonscopy Sep 01, 2006, Dr Henrene Pastor   . Cough 11/12/2009   Qualifier: Diagnosis of  By: [name] MD, Arvid Right.   . Depression 04/08/2007   Qualifier: Diagnosis of  By: [name] MD, [name]   . Diverticulosis of colon   . DIVERTICULOSIS, COLON 08/05/2007   Qualifier: Diagnosis of  By: [name] MD, [place], ATOPIC 11/12/2009   Qualifier: Diagnosis of  By: [name] MD, Arvid Right.   . HEMORRHOIDS 07/10/2008   Qualifier: Diagnosis of  By: [name] MD, [name]   . History of nephrolithiasis   . History of shingles   . Hx of colonic polyp   . Hyperglycemia 11/19/2014  . Hyperlipidemia   . Hypertension    some past history   . Insomnia   . Insomnia, unspecified 04/08/2007   Centricity Description: SYMPTOM, INSOMNIA NOS Qualifier: Diagnosis of  By: [name] MD, [name]  Centricity Description: INSOMNIA-SLEEP DISORDER-UNSPEC Qualifier: Diagnosis of  By: [name] MD, [place], LEFT 07/10/2008   Qualifier: Diagnosis of  By: [name]  MD, [name]   . Libido, decreased 10/04/2011  . Loss of weight 05/30/2014  . Multinodular goiter 12/14/2016  . Nausea in adult patient 05/30/2014  . NEPHROLITHIASIS, HX OF 08/05/2007   Qualifier: Diagnosis of  By: [name] MD, [name]   . RENAL INSUFFICIENCY 09/07/2010   Qualifier: Diagnosis of  By: [name] MD, [name]   . Right leg pain 10/04/2018  . Stroke ([place]) 10/2014  . TIA (transient ischemic attack)   . Vitiligo 10/31/2012    Past Surgical History:  Procedure Laterality Date  . CERVICAL DISC ARTHROPLASTY  12/08/2017  . COLONOSCOPY    . CYSTOSCOPY WITH URETEROSCOPY AND STENT PLACEMENT Left 07/04/2014   Procedure: CYSTOSCOPY WITH LEFT RETROGRADE PYLEOGRAM, LEFT URETERAL DILATION AND URETEROSCOPY, LASER LITHOTRISPY AND STENT PLACEMENT;  Surgeon: [name], MD;  Location: [facility];  Service: Urology;  Laterality: Left;  . HOLMIUM LASER APPLICATION N/A [id number]   Procedure:  LASER LITHOTRIPSY;  Surgeon: [name], MD;  Location: [facility];  Service: Urology;  Laterality: N/A;  . lithotrypsy     x 3  . POLYPECTOMY    . TONSILLECTOMY      Social History   Socioeconomic History  . Marital  status: Single    Spouse name: Not on file  . Number of children: Not on file  . Years of education: Not on file  . Highest education level: Not on file  Occupational History  . Occupation: IT  Tobacco Use  . Smoking status: Former Research scientist (life sciences)  . Smokeless tobacco: Never Used  . Tobacco comment: Regular exercise - No  Substance and Sexual Activity  . Alcohol use: Not Currently    Alcohol/week: 7.0 standard drinks    Types: 7 Cans of beer per week  . Drug use: No  . Sexual activity: Never  Other Topics Concern  . Not on file  Social History Narrative   Works at [employer], divorce, no biological children   Social Determinants of Health   Financial Resource Strain:   . Difficulty of Paying Living Expenses: Not on file  Food Insecurity:   . Worried About Charity fundraiser in the Last  Year: Not on file  . Ran Out of Food in the Last Year: Not on file  Transportation Needs:   . Lack of Transportation (Medical): Not on file  . Lack of Transportation (Non-Medical): Not on file  Physical Activity:   . Days of Exercise per Week: Not on file  . Minutes of Exercise per Session: Not on file  Stress:   . Feeling of Stress : Not on file  Social Connections:   . Frequency of Communication with Friends and Family: Not on file  . Frequency of Social Gatherings with Friends and Family: Not on file  . Attends Religious Services: Not on file  . Active Member of Clubs or Organizations: Not on file  . Attends Archivist Meetings: Not on file  . Marital Status: Not on file  Intimate Partner Violence:   . Fear of Current or Ex-Partner: Not on file  . Emotionally Abused: Not on file  . Physically Abused: Not on file  . Sexually Abused: Not on file    Current Outpatient Medications on File Prior to Visit  Medication Sig Dispense Refill  . Bempedoic Acid-Ezetimibe (NEXLIZET) 180-10 MG TABS Take 1 tablet by mouth daily. 90 tablet 3  . bisacodyl (DULCOLAX) 5 MG EC tablet Take 5 mg by mouth daily as needed.    . cetirizine (ZYRTEC) 10 MG tablet Take 10 mg by mouth daily as needed for allergies.    Marland Kitchen co-enzyme Q-10 30 MG capsule Take 100 mg by mouth as needed.     . docusate sodium (COLACE) 100 MG capsule Take 100 mg by mouth daily as needed for mild constipation.    . fluticasone (FLONASE) 50 MCG/ACT nasal spray USE 2 SPRAYS IN EACH  NOSTRIL DAILY 48 g 1  . losartan-hydrochlorothiazide (HYZAAR) 100-12.5 MG tablet Take 1 tablet by mouth as needed.    . Melatonin 5 MG TABS Take 1 tablet by mouth as needed.     . metroNIDAZOLE (METROGEL) 1 % gel APPLY TO AFFECTED AREA(S)  TOPICALLY DAILY AS NEEDED  FOR ROSACEA 60 g 2  . Naphazoline-Pheniramine (OPCON-A OP) Apply 1 drop to eye as needed.     Marland Kitchen OVER THE COUNTER MEDICATION Trace mineral supplement daily- 10 drops    . psyllium  (METAMUCIL) 58.6 % powder Take 1 packet by mouth daily.    . rosuvastatin (CRESTOR) 10 MG tablet Take 1 tablet (10 mg total) by mouth daily. 90 tablet 3  . Vitamin D, Cholecalciferol, 10 MCG (400 UNIT) CAPS Take 1 capsule by mouth daily.  No current facility-administered medications on file prior to visit.    Allergies  Allergen Reactions  . Crestor [Rosuvastatin]     REACTION: myalgia  . Lipitor [Atorvastatin]     REACTION: myalgia  . Praluent [Alirocumab]     ineffective  . Repatha [Evolocumab]     ineffective  . Zocor [Simvastatin]     REACTION: myalgia  . Adhesive [Tape] Rash    Family History  Problem Relation Age of Onset  . Ulcerative colitis Sister   . Colitis Sister   . Heart disease Other        Grandparents  . Hyperlipidemia Other   . Hypertension Other   . Heart failure Mother   . CAD Mother        died of CAD at age 45  . Heart failure Father   . CAD Father        died from cardiac arrest at 73  . Lupus Paternal Grandfather   . Heart failure Maternal Grandfather   . Colon cancer Neg Hx   . Esophageal cancer Neg Hx   . Pancreatic cancer Neg Hx   . Stomach cancer Neg Hx   . Liver disease Neg Hx   . Colon polyps Neg Hx   . Rectal cancer Neg Hx   . Diabetes Neg Hx     BP 118/76 (BP Location: Left Arm, Patient Position: Sitting, Cuff Size: Normal)   Pulse (!) 59   Ht 5\' 7"  (1.702 m)   Wt 136 lb 6.4 oz (61.9 kg)   SpO2 99%   BMI 21.36 kg/m    Review of Systems denies sob, n/v, memory loss, and depression.  He has regained some of the weight he lost during episode of bacterial overgrowth.  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      Objective:   Physical Exam VS: see vs page GEN: no distress HEAD: head: no deformity eyes: no periorbital swelling, no proptosis external nose and ears are normal NECK: supple, thyroid is not enlarged CHEST WALL: no deformity LUNGS: clear to auscultation CV: reg rate and rhythm, no murmur.    MUSCULOSKELETAL: muscle bulk and strength are grossly normal.  no obvious joint swelling.  gait is normal and steady.   EXTEMITIES: no deformity.  Trace bilat leg edema PULSES: no carotid bruit NEURO:  cn 2-12 grossly intact.   readily moves all 4's.  sensation is intact to touch on all 4's SKIN:  Normal texture and temperature.  No rash or suspicious lesion is visible.   NODES:  None palpable at the neck PSYCH: alert, well-oriented.  Does not appear anxious nor depressed.     Lab Results  Component Value Date   HGBA1C 5.6 12/23/2014   Lab Results  Component Value Date   CREATININE 1.38 10/03/2019   BUN 21 10/03/2019   NA 139 10/03/2019   K 3.7 10/03/2019   CL 101 10/03/2019   CO2 30 10/03/2019   Lab Results  Component Value Date   TSH 1.52 10/03/2019   I have reviewed outside records, and summarized: Pt was noted to have elevated a1c, and referred here.  Wellness was also addressed.       Assessment & Plan:  Hyperglycemia, new to me Lean body habitus: I told pt hyperglycemia is prob due to decreased insulin production, rather than insulin resistance.  Pt requests to test this dx.  Patient Instructions  Please come in for fasting blood tests.  Then please go  eat breakfast, and redo the blood tests 1-2 HRS after eating. We'll let you know about the results.  You risk for developing diabetes is approx 10% per year.  Some diabetes medications reduce this to approx 5%. When we see the test results, we'll decide if medication would help you. Please come back for a follow-up appointment in 3 months.

## 2019-11-13 NOTE — Patient Instructions (Signed)
Please come in for fasting blood tests.  Then please go eat breakfast, and redo the blood tests 1-2 HRS after eating. We'll let you know about the results.  You risk for developing diabetes is approx 10% per year.  Some diabetes medications reduce this to approx 5%. When we see the test results, we'll decide if medication would help you. Please come back for a follow-up appointment in 3 months.

## 2020-01-15 ENCOUNTER — Other Ambulatory Visit: Payer: Self-pay | Admitting: Internal Medicine

## 2020-01-29 ENCOUNTER — Other Ambulatory Visit: Payer: 59

## 2020-01-29 ENCOUNTER — Other Ambulatory Visit: Payer: Self-pay | Admitting: Endocrinology

## 2020-01-29 ENCOUNTER — Encounter: Payer: Self-pay | Admitting: Endocrinology

## 2020-01-29 ENCOUNTER — Telehealth: Payer: Self-pay | Admitting: Endocrinology

## 2020-01-29 DIAGNOSIS — N289 Disorder of kidney and ureter, unspecified: Secondary | ICD-10-CM

## 2020-01-29 DIAGNOSIS — R739 Hyperglycemia, unspecified: Secondary | ICD-10-CM

## 2020-01-29 NOTE — Telephone Encounter (Signed)
Order Information  Order ID: [id number]  Procedure/Medication: Glucose, random [LAB82]  Patient: [name], [name] [id number]  Encounter Date: 01/29/20 (DAT: [id number])  Encounter Type: Orders Only [111]  Department: [facility] [id number]  Order Workstation: [id number]  Order Transmittal Group: Maureen Ralphs [id number]  Order Sent By: Renato Shin, MD [[id number]]  Standing/Future Order: Future [2]      Blood      Resulting Agency: [facility]       Order Information  Order ID: [id number]  Procedure/Medication: Insulin, random [LAB575]  Patient: [name], [name] [id number]  Encounter Date: 01/29/20 (DAT: [id number])  Encounter Type: Orders Only [111]  Department: [facility] [id number]  Order Workstation: [id number]  Order Transmittal Group: Maureen Ralphs [id number]  Order Sent By: Renato Shin, MD [[id number]]  Standing/Future Order: Future [2]    Rules Used   Resulting Agency: [facility]

## 2020-01-29 NOTE — Telephone Encounter (Signed)
Patient went to [facility] to get labs drawn and called saying they could not do them. I called [facility] and they said they cannot do labs unless it says "future labs" - requesting to please re order the labs to say "future" for the glucose and insulin.

## 2020-01-29 NOTE — Telephone Encounter (Signed)
Please edit your orders so labs can be completed

## 2020-01-29 NOTE — Telephone Encounter (Signed)
I replaced orders. F/u OV is due

## 2020-02-04 ENCOUNTER — Encounter: Payer: Self-pay | Admitting: Endocrinology

## 2020-02-04 ENCOUNTER — Other Ambulatory Visit (INDEPENDENT_AMBULATORY_CARE_PROVIDER_SITE_OTHER): Payer: 59

## 2020-02-04 ENCOUNTER — Other Ambulatory Visit: Payer: Self-pay

## 2020-02-04 ENCOUNTER — Other Ambulatory Visit: Payer: Self-pay | Admitting: Endocrinology

## 2020-02-04 DIAGNOSIS — R739 Hyperglycemia, unspecified: Secondary | ICD-10-CM | POA: Diagnosis not present

## 2020-02-04 LAB — GLUCOSE, RANDOM
Glucose, Bld: 98 mg/dL (ref 70–99)
Glucose, Bld: 78 mg/dL (ref 70–99)

## 2020-02-05 LAB — INSULIN, RANDOM
Insulin: 3.3 u[IU]/mL
Insulin: 4.3 u[IU]/mL

## 2020-02-11 DIAGNOSIS — E7800 Pure hypercholesterolemia, unspecified: Secondary | ICD-10-CM

## 2020-02-18 IMAGING — MR MRI ABDOMEN WITH AND WITHOUT CONTRAST
9 of 18 series · 21 of 48 positions shown · IV contrast (agent unspecified)
Comparison: CT abdomen/pelvis dated 10/31/2018

CLINICAL DATA: Liver lesion on CT

EXAM:
MRI ABDOMEN WITHOUT AND WITH CONTRAST
TECHNIQUE: Multiplanar multisequence MR imaging of the abdomen was performed
both before and after the administration of intravenous contrast.
CONTRAST:  6 mL Gadovist IV

[Series 3: T2 fat-sat · axial · 5.0mm · 0.78mm/px · z∈[-167,+83]mm · 3 of 51 slices shown]
[im 1/51]
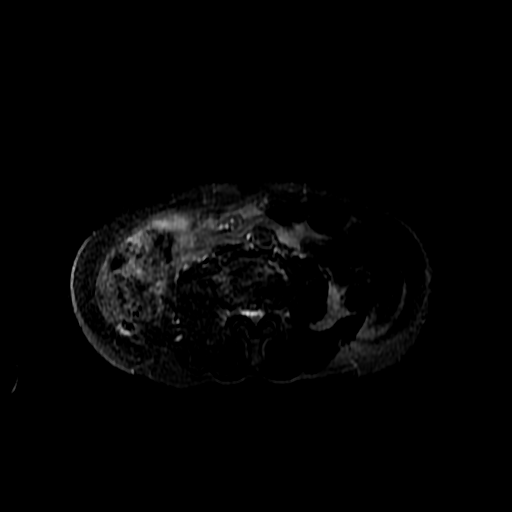
[im 26/51]
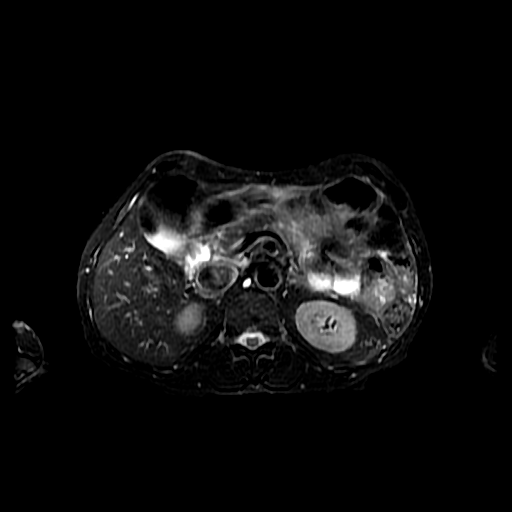
[im 51/51]
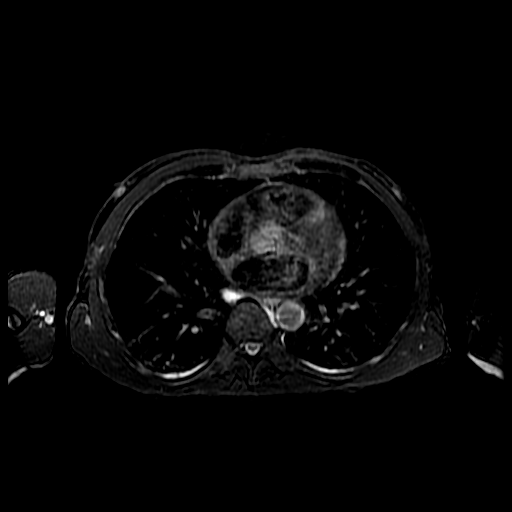

[Series 4: DWI b500 · axial · 6.0mm · 1.48mm/px · z∈[-175,+82]mm · 2 of 68 slices shown]
[im 1/68]
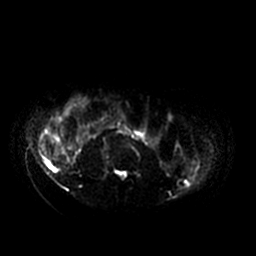
[im 68/68]
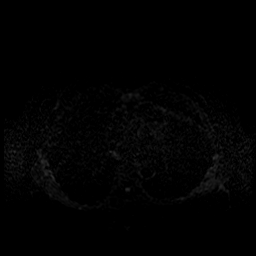

[Series 5: T2 · axial · 5.0mm · 0.78mm/px · z∈[-177,+83]mm · 2 of 53 slices shown (1 of 2)]
[im 1/53]
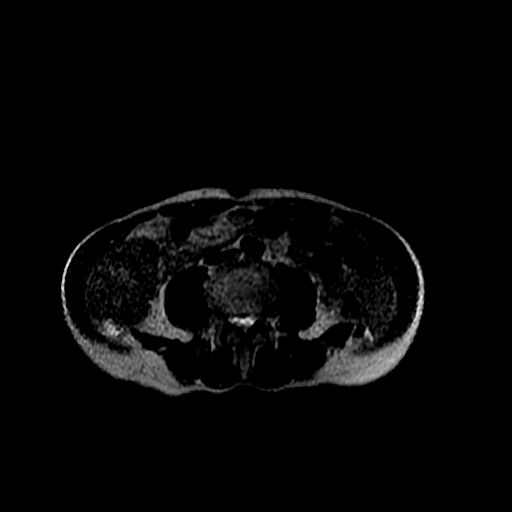
[im 53/53]
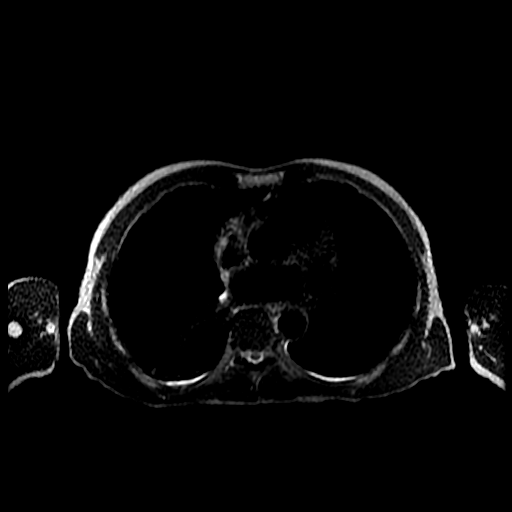

[Series 6: T2 · coronal · 5.0mm · 0.78mm/px · 1 of 40 slices shown (2 of 2)]
[im 1/40]
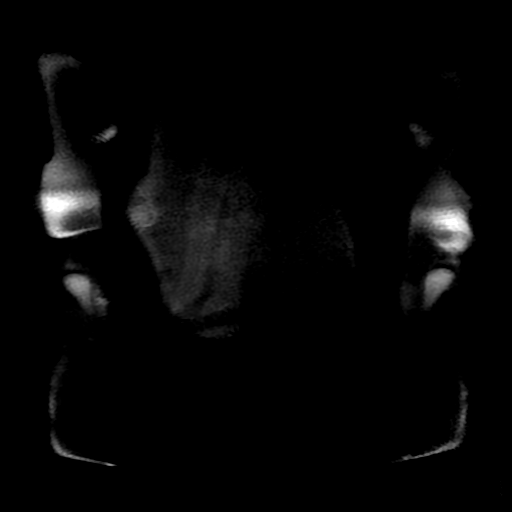

[Series 7: bSSFP · axial · 5.0mm · 0.78mm/px · z∈[-177,+83]mm · 2 of 53 slices shown]
[im 1/53]
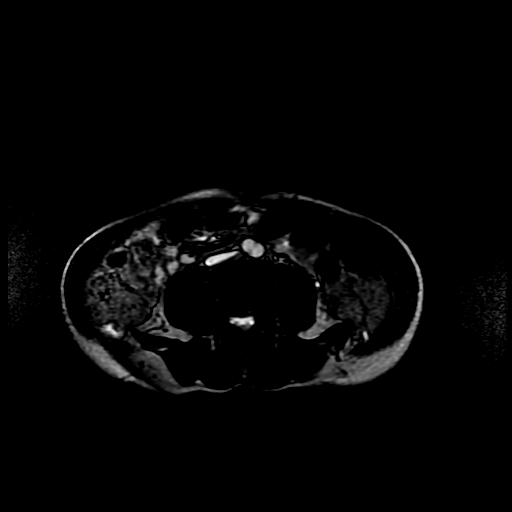
[im 53/53]
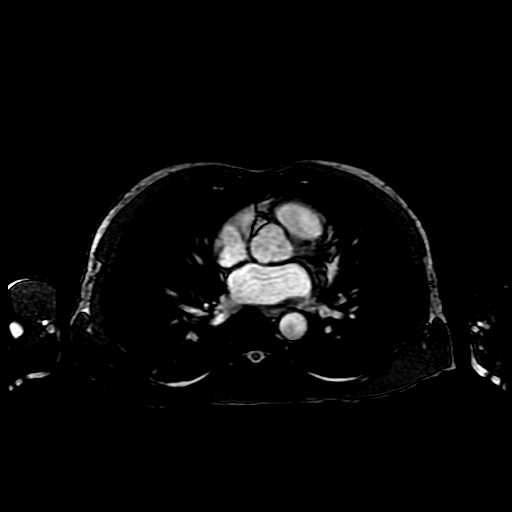

[Series 8: ax dualecho bh · axial · 5.0mm · 0.78mm/px · z∈[-168,+82]mm · 4 of 102 slices shown]
[im 1/102]
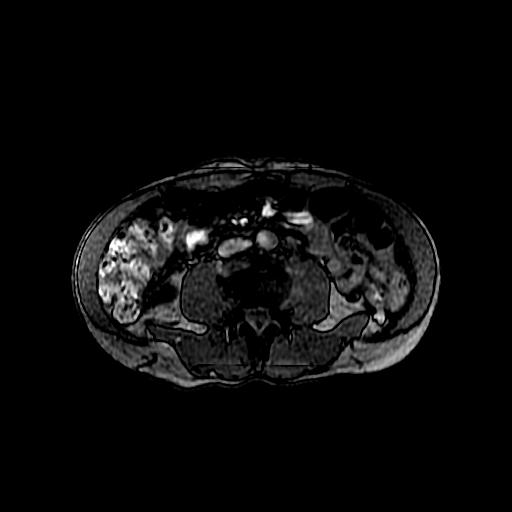
[im 34/102]
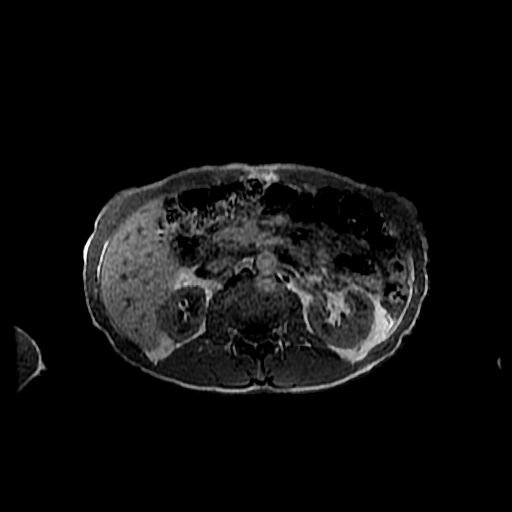
[im 68/102]
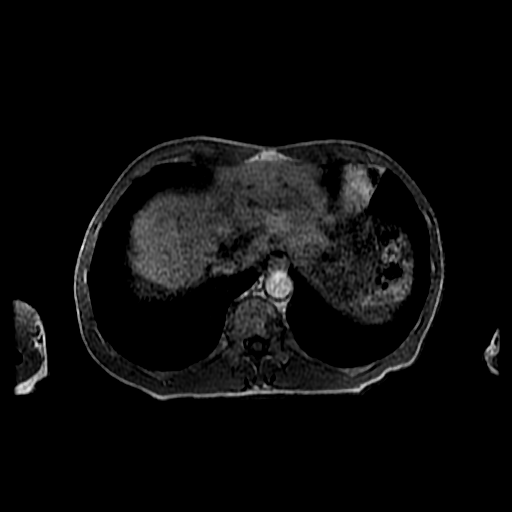
[im 102/102]
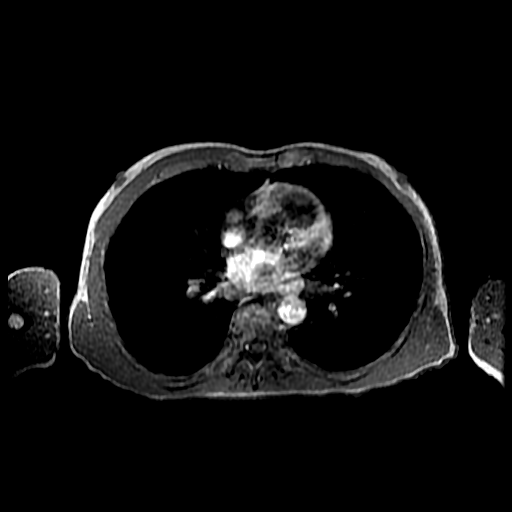

[Series 400: DWI · axial · 6.0mm · 1.48mm/px · 1 of 34 slices shown]
[im 1/34]
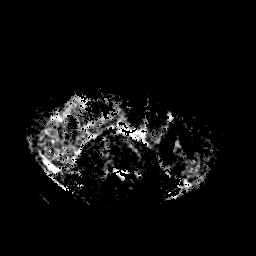

[Series 900: T1 dynamic · axial · 5.6mm · 0.78mm/px · z∈[-165,+79]mm · 3 of 88 slices shown (1 of 2)]
[im 1/88]
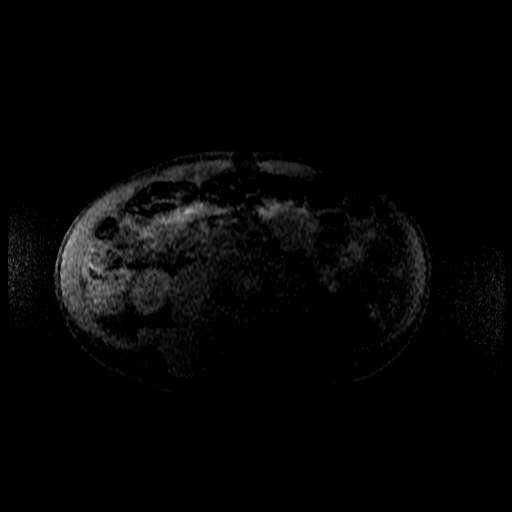
[im 44/88]
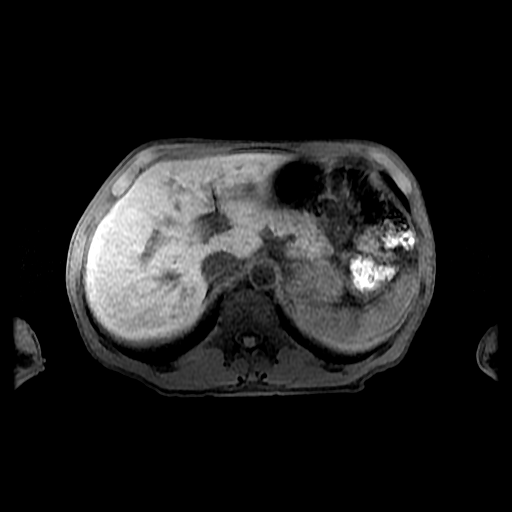
[im 88/88]
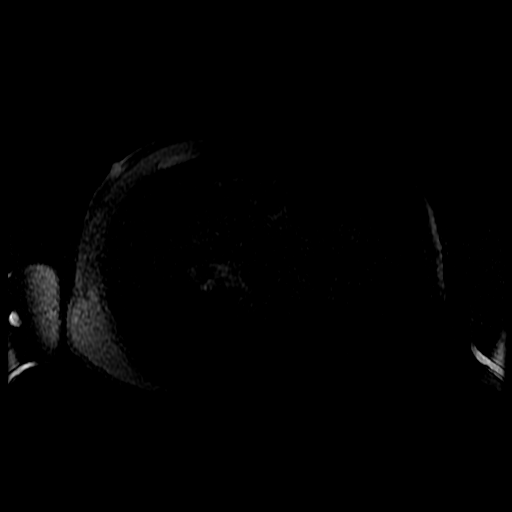

[Series 901: T1 dynamic · axial · 5.6mm · 0.78mm/px · z∈[-165,+79]mm · 3 of 88 slices shown (2 of 2)]
[im 1/88]
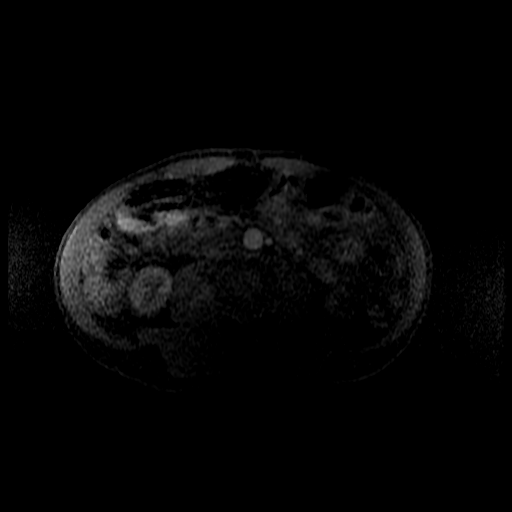
[im 44/88]
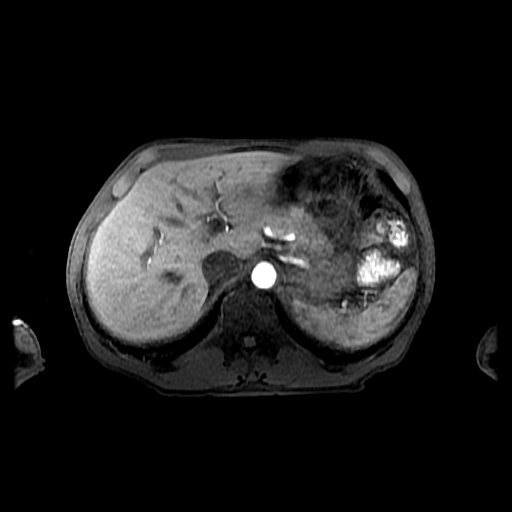
[im 88/88]
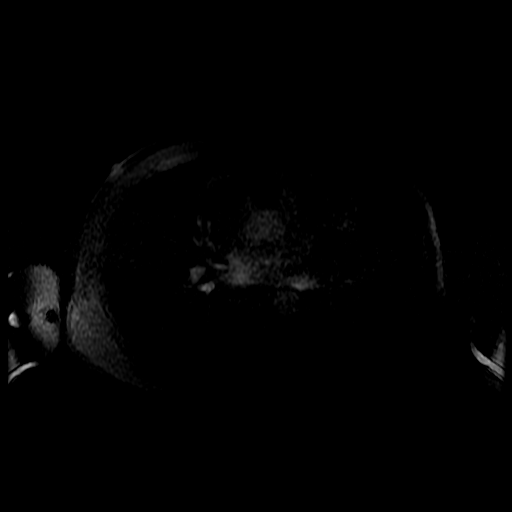

[21 of 48 positions shown; findings below may reference images not displayed]

FINDINGS: Lower chest: Lung bases are clear.

Hepatobiliary: No hepatic steatosis. No morphologic findings of
cirrhosis. Possible subcentimeter hypervascular lesion in segment 4
(series 902/image 41), equivocal. If this reflects a true lesion,
this would favor a flash filling hemangioma versus a perfusion
anomaly.

Gallbladder is unremarkable. No intrahepatic or extrahepatic ductal
dilatation.

Pancreas:  Within normal limits.

Spleen:  Within normal limits.

Adrenals/Urinary Tract:  Adrenal glands are within normal limits.

Kidneys are within normal limits.  No hydronephrosis.

Stomach/Bowel: Stomach is within normal limits.

Visualized bowel is unremarkable.

Vascular/Lymphatic:  No evidence of abdominal aortic aneurysm.

No suspicious abdominal lymphadenopathy.

Other:  No abdominal ascites.

Musculoskeletal: No focal osseous lesions.
IMPRESSION: Possible subcentimeter hypervascular lesion in segment 4, equivocal.
If this reflects a true lesion, this would favor a flash filling
hemangioma versus a perfusion anomaly, benign.

No suspicious hepatic lesions. No hepatic steatosis. No morphologic
findings of cirrhosis.

## 2020-02-20 IMAGING — DX CHEST - 2 VIEW
2 series · 2 of 2 positions shown · non-contrast
Comparison: 05/30/2014

CLINICAL DATA: Unintentional weight loss of 25 lb in past 5 months.
Hypertension. Former smoker.

EXAM:
CHEST - 2 VIEW

[chest pa]
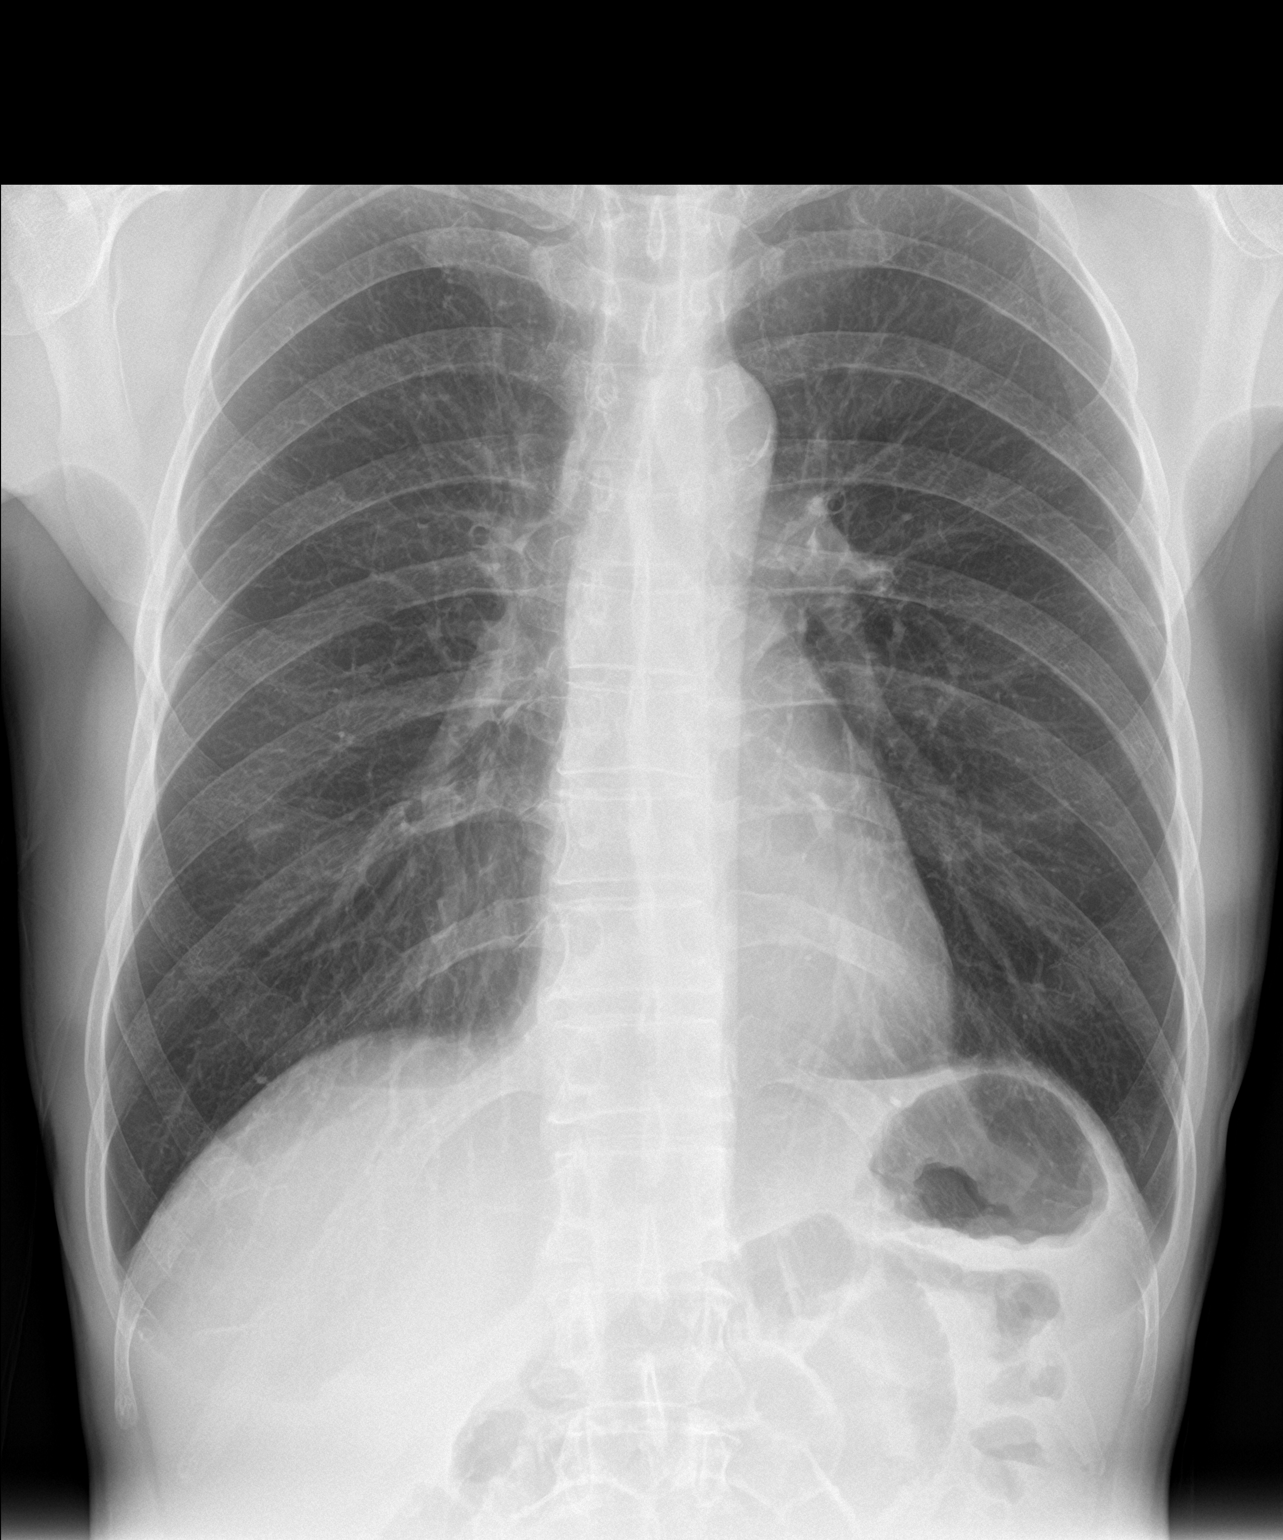

[chest lat]
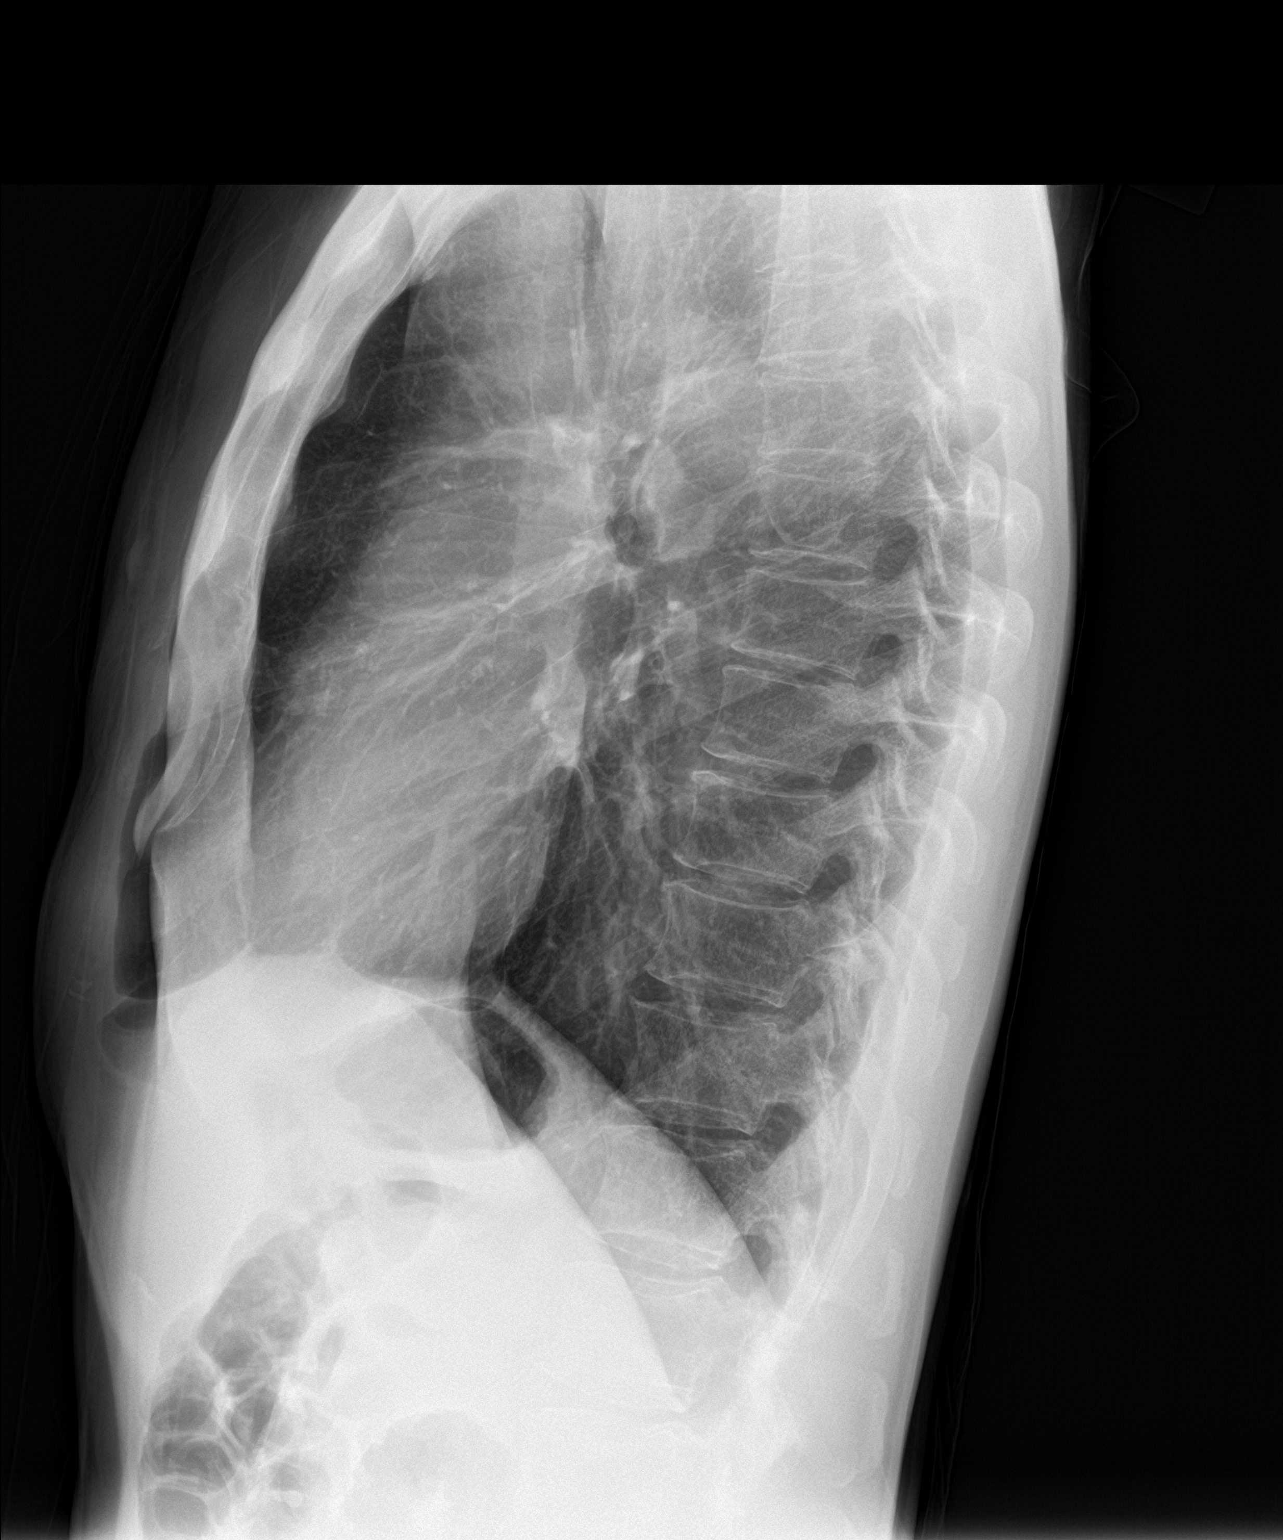

[2 of 2 positions shown; findings below may reference images not displayed]

FINDINGS: The heart size and mediastinal contours are within normal limits.
Aortic atherosclerosis. Both lungs are clear. The visualized
skeletal structures are unremarkable.
IMPRESSION: No active cardiopulmonary disease.

## 2020-02-29 DIAGNOSIS — B372 Candidiasis of skin and nail: Secondary | ICD-10-CM | POA: Insufficient documentation

## 2020-03-05 ENCOUNTER — Encounter: Payer: Self-pay | Admitting: Internal Medicine

## 2020-03-06 ENCOUNTER — Ambulatory Visit: Payer: 59 | Admitting: Family

## 2020-03-06 ENCOUNTER — Other Ambulatory Visit: Payer: Self-pay

## 2020-03-06 VITALS — BP 110/60 | HR 61 | Temp 98.9°F | Ht 67.0 in | Wt 137.0 lb

## 2020-03-06 DIAGNOSIS — L304 Erythema intertrigo: Secondary | ICD-10-CM

## 2020-03-06 MED ORDER — CEPHALEXIN 500 MG PO CAPS: 500.0000 mg | ORAL_CAPSULE | Freq: Three times a day (TID) | ORAL | 0 refills | Status: AC

## 2020-03-06 MED ORDER — FLUCONAZOLE 100 MG PO TABS
100.0000 mg | ORAL_TABLET | Freq: Every day | ORAL | 0 refills | Status: DC
Start: 2020-03-06 — End: 2020-10-05

## 2020-03-06 MED ORDER — NYSTATIN-TRIAMCINOLONE 100000-0.1 UNIT/GM-% EX CREA: 1.0000 "application " | TOPICAL_CREAM | Freq: Two times a day (BID) | CUTANEOUS | 0 refills | Status: DC

## 2020-03-06 NOTE — Patient Instructions (Signed)
Intertrigo Intertrigo is skin irritation or inflammation (dermatitis) that occurs when folds of skin rub together. The irritation can cause a rash and make skin raw and itchy. This condition most commonly occurs in the skin folds of these areas:  Toes.  Armpits.  Groin.  Under the belly.  Under the breasts.  Buttocks. Intertrigo is not passed from person to person (is not contagious). What are the causes? This condition is caused by heat, moisture, rubbing (friction), and not enough air circulation. The condition can be made worse by:  Sweat.  Bacteria.  A fungus, such as yeast. What increases the risk? This condition is more likely to occur if you have moisture in your skin folds. You are more likely to develop this condition if you:  Have diabetes.  Are overweight.  Are not able to move around or are not active.  Live in a warm and moist climate.  Wear splints, braces, or other medical devices.  Are not able to control your bowels or bladder (have incontinence). What are the signs or symptoms? Symptoms of this condition include:  A pink or red skin rash in the skin fold or near the skin fold.  Raw or scaly skin.  Itchiness.  A burning feeling.  Bleeding.  Leaking fluid.  A bad smell. How is this diagnosed? This condition is diagnosed with a medical history and physical exam. You may also have a skin swab to test for bacteria or a fungus. How is this treated? This condition may be treated by:  Cleaning and drying your skin.  Taking an antibiotic medicine or using an antibiotic skin cream for a bacterial infection.  Using an antifungal cream on your skin or taking pills for an infection that was caused by a fungus, such as yeast.  Using a steroid ointment to relieve itchiness and irritation.  Separating the skin fold with a clean cotton cloth to absorb moisture and allow air to flow into the area. Follow these instructions at home:  Keep the  affected area clean and dry.  Do not scratch your skin.  Stay in a cool environment as much as possible. Use an air conditioner or fan, if available.  Apply over-the-counter and prescription medicines only as told by your health care provider.  If you were prescribed an antibiotic medicine, use it as told by your health care provider. Do not stop using the antibiotic even if your condition improves.  Keep all follow-up visits as told by your health care provider. This is important. How is this prevented?   Maintain a healthy weight.  Take care of your feet, especially if you have diabetes. Foot care includes: ? Wearing shoes that fit well. ? Keeping your feet dry. ? Wearing clean, breathable socks.  Protect the skin around your groin and buttocks, especially if you have incontinence. Skin protection includes: ? Following a regular cleaning routine. ? Using skin protectant creams, powders, or ointments. ? Changing protection pads frequently.  Do not wear tight clothes. Wear clothes that are loose, absorbent, and made of cotton.  Wear a bra that gives good support, if needed.  Shower and dry yourself well after activity or exercise. Use a hair dryer on a cool setting to dry between skin folds, especially after you bathe.  If you have diabetes, keep your blood sugar under control. Contact a health care provider if:  Your symptoms do not improve with treatment.  Your symptoms get worse or they spread.  You notice increased redness and   warmth.  You have a fever. Summary  Intertrigo is skin irritation or inflammation (dermatitis) that occurs when folds of skin rub together.  This condition is caused by heat, moisture, rubbing (friction), and not enough air circulation.  This condition may be treated by cleaning and drying your skin and with medicines.  Apply over-the-counter and prescription medicines only as told by your health care provider.  Keep all follow-up visits  as told by your health care provider. This is important. This information is not intended to replace advice given to you by your health care provider. Make sure you discuss any questions you have with your health care provider. Document Revised: 02/12/2018 Document Reviewed: 02/12/2018 Elsevier Patient Education  2020 [employer]  

## 2020-03-06 NOTE — Progress Notes (Signed)
Jerry Rios is a 69 y.o. male with the following history as recorded in [employer]:  Patient Active Problem List   Diagnosis Date Noted  . Renal insufficiency 11/13/2019  . Weight loss 03/07/2019  . Right leg pain 10/04/2018  . Cervical radiculopathy 08/01/2017  . Multinodular goiter 12/14/2016  . HTN (hypertension) 07/21/2016  . Hyperglycemia 11/19/2014  . TIA (transient ischemic attack) 11/18/2014  . Abdominal pain, unspecified site 05/30/2014  . Nausea in adult patient 05/30/2014  . Loss of weight 05/30/2014  . Vitiligo 10/31/2012  . Libido, decreased 10/04/2011  . Preventative health care 10/01/2011  . RENAL INSUFFICIENCY 09/07/2010  . ECZEMA, ATOPIC 11/12/2009  . HEMORRHOIDS 07/10/2008  . BACK PAIN, LUMBAR 07/10/2008  . Hyperlipidemia 08/05/2007  . DIVERTICULOSIS, COLON 08/05/2007  . BENIGN PROSTATIC HYPERTROPHY 08/05/2007  . COLONIC POLYPS, HX OF 08/05/2007  . NEPHROLITHIASIS, HX OF 08/05/2007  . Depression 04/08/2007  . Allergic rhinitis 04/08/2007  . Insomnia, unspecified 04/08/2007    Current Outpatient Medications  Medication Sig Dispense Refill  . Bempedoic Acid-Ezetimibe (NEXLIZET) 180-10 MG TABS Take 1 tablet by mouth daily. 90 tablet 3  . bisacodyl (DULCOLAX) 5 MG EC tablet Take 5 mg by mouth daily as needed.    . cephALEXin (KEFLEX) 500 MG capsule Take 1 capsule (500 mg total) by mouth 3 (three) times daily for 5 days. 15 capsule 0  . cetirizine (ZYRTEC) 10 MG tablet Take 10 mg by mouth daily as needed for allergies.    Rios Kitchen co-enzyme Q-10 30 MG capsule Take 100 mg by mouth as needed.     . docusate sodium (COLACE) 100 MG capsule Take 100 mg by mouth daily as needed for mild constipation.    . fluconazole (DIFLUCAN) 100 MG tablet Take 1 tablet (100 mg total) by mouth daily. 7 tablet 0  . fluticasone (FLONASE) 50 MCG/ACT nasal spray USE 2 SPRAYS IN EACH  NOSTRIL DAILY 48 g 1  . losartan-hydrochlorothiazide (HYZAAR) 100-12.5 MG tablet Take 1 tablet by mouth as  needed.    . Melatonin 5 MG TABS Take 1 tablet by mouth as needed.     . metroNIDAZOLE (METROGEL) 1 % gel APPLY TO AFFECTED AREA(S)  TOPICALLY DAILY AS NEEDED  FOR ROSACEA 60 g 2  . Naphazoline-Pheniramine (OPCON-A OP) Apply 1 drop to eye as needed.     . nystatin-triamcinolone (MYCOLOG II) cream Apply 1 application topically 2 (two) times daily. 60 g 0  . OVER THE COUNTER MEDICATION Trace mineral supplement daily- 10 drops    . psyllium (METAMUCIL) 58.6 % powder Take 1 packet by mouth daily.    . rosuvastatin (CRESTOR) 10 MG tablet Take 1 tablet (10 mg total) by mouth daily. 90 tablet 3  . Vitamin D, Cholecalciferol, 10 MCG (400 UNIT) CAPS Take 1 capsule by mouth daily.     No current facility-administered medications for this visit.    Allergies: Crestor [rosuvastatin], Lipitor [atorvastatin], Praluent [alirocumab], Repatha [evolocumab], Zocor [simvastatin], and Adhesive [tape]  Past Medical History:  Diagnosis Date  . Abdominal pain, unspecified site 05/30/2014  . Allergic rhinitis 04/08/2007   Qualifier: Diagnosis of  By: [name] MD, [name]   . Allergy   . Arthritis    lumbar spine  . BACK PAIN, LUMBAR 07/10/2008   Qualifier: Diagnosis of  By: [name] MD, [name]   . BENIGN PROSTATIC HYPERTROPHY 08/05/2007   Qualifier: Diagnosis of  By: [name] MD, [name]   . BPH (benign prostatic hypertrophy)   . Cataract  starting- mild   . Cervical radiculopathy 08/01/2017  . COLONIC POLYPS, HX OF 08/05/2007   Adenoma polpy per last coonscopy Sep 01, 2006, Dr Henrene Pastor   . Cough 11/12/2009   Qualifier: Diagnosis of  By: [name] MD, Arvid Right.   . Depression 04/08/2007   Qualifier: Diagnosis of  By: [name] MD, [name]   . Diverticulosis of colon   . DIVERTICULOSIS, COLON 08/05/2007   Qualifier: Diagnosis of  By: [name] MD, [place], ATOPIC 11/12/2009   Qualifier: Diagnosis of  By: [name] MD, Arvid Right.   . HEMORRHOIDS 07/10/2008   Qualifier: Diagnosis of  By: [name] MD, [name]   . History of  nephrolithiasis   . History of shingles   . Hx of colonic polyp   . Hyperglycemia 11/19/2014  . Hyperlipidemia   . Hypertension    some past history   . Insomnia   . Insomnia, unspecified 04/08/2007   Centricity Description: SYMPTOM, INSOMNIA NOS Qualifier: Diagnosis of  By: [name] MD, [name]  Centricity Description: INSOMNIA-SLEEP DISORDER-UNSPEC Qualifier: Diagnosis of  By: [name] MD, [place], LEFT 07/10/2008   Qualifier: Diagnosis of  By: [name] MD, [name]   . Libido, decreased 10/04/2011  . Loss of weight 05/30/2014  . Multinodular goiter 12/14/2016  . Nausea in adult patient 05/30/2014  . NEPHROLITHIASIS, HX OF 08/05/2007   Qualifier: Diagnosis of  By: [name] MD, [name]   . RENAL INSUFFICIENCY 09/07/2010   Qualifier: Diagnosis of  By: [name] MD, [name]   . Right leg pain 10/04/2018  . Stroke ([place]) 10/2014  . TIA (transient ischemic attack)   . Vitiligo 10/31/2012    Past Surgical History:  Procedure Laterality Date  . CERVICAL DISC ARTHROPLASTY  12/08/2017  . COLONOSCOPY    . CYSTOSCOPY WITH URETEROSCOPY AND STENT PLACEMENT Left 07/04/2014   Procedure: CYSTOSCOPY WITH LEFT RETROGRADE PYLEOGRAM, LEFT URETERAL DILATION AND URETEROSCOPY, LASER LITHOTRISPY AND STENT PLACEMENT;  Surgeon: [name], MD;  Location: [facility];  Service: Urology;  Laterality: Left;  . HOLMIUM LASER APPLICATION N/A 25/05/5637   Procedure:  LASER LITHOTRIPSY;  Surgeon: [name], MD;  Location: [facility];  Service: Urology;  Laterality: N/A;  . lithotrypsy     x 3  . POLYPECTOMY    . TONSILLECTOMY      Family History  Problem Relation Age of Onset  . Ulcerative colitis Sister   . Colitis Sister   . Heart disease Other        Grandparents  . Hyperlipidemia Other   . Hypertension Other   . Heart failure Mother   . CAD Mother        died of CAD at age 18  . Heart failure Father   . CAD Father        died from cardiac arrest at 58  . Lupus Paternal Grandfather   . Heart failure Maternal  Grandfather   . Colon cancer Neg Hx   . Esophageal cancer Neg Hx   . Pancreatic cancer Neg Hx   . Stomach cancer Neg Hx   . Liver disease Neg Hx   . Colon polyps Neg Hx   . Rectal cancer Neg Hx   . Diabetes Neg Hx     Social History   Tobacco Use  . Smoking status: Former Research scientist (life sciences)  . Smokeless tobacco: Never Used  . Tobacco comment: Regular exercise - No  Substance Use Topics  .  Alcohol use: Not Currently    Alcohol/week: 7.0 standard drinks    Types: 7 Cans of beer per week    Subjective:  Rash in groin area- started on Sunday; was exposed to chiggers and is suspicious that this is source of exposure; has been using oral and topical Benadryl with limited benefit; + weeping; denies any new soaps, foods, detergents or medications. Feels that his penis and testicles are swollen; urinating normally; Had some mild lower abdominal pain this am but able to go work today; no fever;    Objective:  Vitals:   03/06/20 1139  BP: 110/60  Pulse: 61  Temp: 98.9 F (37.2 C)  TempSrc: Oral  SpO2: 96%  Weight: 137 lb (62.1 kg)  Height: 5\' 7"  (1.702 m)    General: Well developed, well nourished, in no acute distress  Skin : Warm and dry. Marked erythema/ swelling noted in skin folds of groin region; no obvious weeping sores/ no streaking Head: Normocephalic and atraumatic  Lungs: Respirations unlabored;  Musculoskeletal: No deformities; no active joint inflammation  Extremities: No edema, cyanosis, clubbing  Vessels: Symmetric bilaterally  Neurologic: Alert and oriented; speech intact; face symmetrical; moves all extremities well; CNII-XII intact without focal deficit   Assessment:  1. Intertrigo     Plan:  Less likely allergic reaction to chiggers as patient has suspected; will treat for fungal infection with secondary bacterial infection; encouraged to try and keep area dry and cool; follow-up worse, no better.   This visit occurred during the SARS-CoV-2 public health emergency.   Safety protocols were in place, including screening questions prior to the visit, additional usage of staff PPE, and extensive cleaning of exam room while observing appropriate contact time as indicated for disinfecting solutions.     No follow-ups on file.  No orders of the defined types were placed in this encounter.   Requested Prescriptions   Signed Prescriptions Disp Refills  . fluconazole (DIFLUCAN) 100 MG tablet 7 tablet 0    Sig: Take 1 tablet (100 mg total) by mouth daily.  . cephALEXin (KEFLEX) 500 MG capsule 15 capsule 0    Sig: Take 1 capsule (500 mg total) by mouth 3 (three) times daily for 5 days.  Rios Kitchen nystatin-triamcinolone (MYCOLOG II) cream 60 g 0    Sig: Apply 1 application topically 2 (two) times daily.

## 2020-03-23 ENCOUNTER — Other Ambulatory Visit: Payer: Self-pay

## 2020-03-23 ENCOUNTER — Other Ambulatory Visit: Payer: 59 | Admitting: *Deleted

## 2020-03-23 DIAGNOSIS — E7800 Pure hypercholesterolemia, unspecified: Secondary | ICD-10-CM

## 2020-03-23 LAB — COMPREHENSIVE METABOLIC PANEL
ALT: 12 IU/L (ref 0–44)
AST: 22 IU/L (ref 0–40)
Albumin/Globulin Ratio: 2 (ref 1.2–2.2)
Albumin: 4.5 g/dL (ref 3.8–4.8)
Alkaline Phosphatase: 27 IU/L — ABNORMAL LOW (ref 48–121)
BUN/Creatinine Ratio: 13 (ref 10–24)
BUN: 19 mg/dL (ref 8–27)
Bilirubin Total: 0.5 mg/dL (ref 0.0–1.2)
CO2: 26 mmol/L (ref 20–29)
Calcium: 9.7 mg/dL (ref 8.6–10.2)
Chloride: 101 mmol/L (ref 96–106)
Creatinine, Ser: 1.46 mg/dL — ABNORMAL HIGH (ref 0.76–1.27)
GFR calc Af Amer: 56 mL/min/{1.73_m2} — ABNORMAL LOW (ref >59)
GFR calc non Af Amer: 48 mL/min/{1.73_m2} — ABNORMAL LOW (ref >59)
Globulin, Total: 2.3 g/dL (ref 1.5–4.5)
Glucose: 102 mg/dL — ABNORMAL HIGH (ref 65–99)
Potassium: 4.1 mmol/L (ref 3.5–5.2)
Sodium: 139 mmol/L (ref 134–144)
Total Protein: 6.8 g/dL (ref 6.0–8.5)

## 2020-03-23 LAB — LIPID PANEL
Chol/HDL Ratio: 1.8 ratio (ref 0.0–5.0)
Cholesterol, Total: 168 mg/dL (ref 100–199)
HDL: 91 mg/dL (ref >39)
LDL Chol Calc (NIH): 69 mg/dL (ref 0–99)
Triglycerides: 33 mg/dL (ref 0–149)
VLDL Cholesterol Cal: 8 mg/dL (ref 5–40)

## 2020-03-24 ENCOUNTER — Telehealth: Payer: Self-pay

## 2020-03-24 NOTE — Telephone Encounter (Signed)
Spoke with patient who reports that he has not taken losartan-hctz in about 6 months. I have updated his medication list.

## 2020-03-24 NOTE — Telephone Encounter (Signed)
-----   Message from Sueanne Margarita, MD sent at 03/24/2020 12:51 PM EDT ----- Please forward labs to PCP to review renal function.  He is currently not on drugs that would affect his kidneys.  Lipids look good. Also can you update med list to reflect that he is not taking [place]

## 2020-03-31 NOTE — Telephone Encounter (Signed)
In a lab result note there was a note stating that he needed an appointment to go over meds. Please advise on below.

## 2020-04-02 ENCOUNTER — Other Ambulatory Visit: Payer: Self-pay

## 2020-04-02 ENCOUNTER — Ambulatory Visit: Payer: 59 | Admitting: Internal Medicine

## 2020-04-02 VITALS — BP 100/58 | HR 50 | Temp 98.1°F | Ht 67.0 in | Wt 134.2 lb

## 2020-04-02 DIAGNOSIS — I10 Essential (primary) hypertension: Secondary | ICD-10-CM | POA: Diagnosis not present

## 2020-04-02 DIAGNOSIS — N183 Chronic kidney disease, stage 3 unspecified: Secondary | ICD-10-CM | POA: Diagnosis not present

## 2020-04-02 DIAGNOSIS — R070 Pain in throat: Secondary | ICD-10-CM | POA: Diagnosis not present

## 2020-04-02 DIAGNOSIS — E049 Nontoxic goiter, unspecified: Secondary | ICD-10-CM | POA: Diagnosis not present

## 2020-04-02 NOTE — Progress Notes (Signed)
Subjective:    Patient ID: Jerry Rios, male    DOB: 10/14/1950, 69 y.o.   MRN: [id number]  HPI  Here to f/u; overall doing ok,  Pt denies chest pain, increasing sob or doe, wheezing, orthopnea, PND, increased LE swelling, palpitations, dizziness or syncope.  Pt denies new neurological symptoms such as new headache, or facial or extremity weakness or numbness.  Pt denies polydipsia, polyuria, or low sugar episode.  Pt states overall good compliance with meds, mostly trying to follow appropriate diet, with wt overall stable,  Denies hyper or hypo thyroid symptoms such as voice, skin or hair change, but does have mild enlarged thyroid.  Also has ongoing throat pain, mild persistent, wondering if thyroid casuing this, requests ent referral   Pt denies fever, wt loss, night sweats, loss of appetite, or other constitutional symptoms Past Medical History:  Diagnosis Date  . Abdominal pain, unspecified site 05/30/2014  . Allergic rhinitis 04/08/2007   Qualifier: Diagnosis of  By: [name] MD, [name]   . Allergy   . Arthritis    lumbar spine  . BACK PAIN, LUMBAR 07/10/2008   Qualifier: Diagnosis of  By: [name] MD, [name]   . BENIGN PROSTATIC HYPERTROPHY 08/05/2007   Qualifier: Diagnosis of  By: [name] MD, [name]   . BPH (benign prostatic hypertrophy)   . Cataract    starting- mild   . Cervical radiculopathy 08/01/2017  . COLONIC POLYPS, HX OF 08/05/2007   Adenoma polpy per last coonscopy Sep 01, 2006, Dr Henrene Pastor   . Cough 11/12/2009   Qualifier: Diagnosis of  By: [name] MD, Arvid Right.   . Depression 04/08/2007   Qualifier: Diagnosis of  By: [name] MD, [name]   . Diverticulosis of colon   . DIVERTICULOSIS, COLON 08/05/2007   Qualifier: Diagnosis of  By: [name] MD, [place], ATOPIC 11/12/2009   Qualifier: Diagnosis of  By: [name] MD, Arvid Right.   . HEMORRHOIDS 07/10/2008   Qualifier: Diagnosis of  By: [name] MD, [name]   . History of nephrolithiasis   . History of shingles   . Hx of colonic polyp    . Hyperglycemia 11/19/2014  . Hyperlipidemia   . Hypertension    some past history   . Insomnia   . Insomnia, unspecified 04/08/2007   Centricity Description: SYMPTOM, INSOMNIA NOS Qualifier: Diagnosis of  By: [name] MD, [name]  Centricity Description: INSOMNIA-SLEEP DISORDER-UNSPEC Qualifier: Diagnosis of  By: [name] MD, [place], LEFT 07/10/2008   Qualifier: Diagnosis of  By: [name] MD, [name]   . Libido, decreased 10/04/2011  . Loss of weight 05/30/2014  . Multinodular goiter 12/14/2016  . Nausea in adult patient 05/30/2014  . NEPHROLITHIASIS, HX OF 08/05/2007   Qualifier: Diagnosis of  By: [name] MD, [name]   . RENAL INSUFFICIENCY 09/07/2010   Qualifier: Diagnosis of  By: [name] MD, [name]   . Right leg pain 10/04/2018  . Stroke ([place]) 10/2014  . TIA (transient ischemic attack)   . Vitiligo 10/31/2012   Past Surgical History:  Procedure Laterality Date  . CERVICAL DISC ARTHROPLASTY  12/08/2017  . COLONOSCOPY    . CYSTOSCOPY WITH URETEROSCOPY AND STENT PLACEMENT Left 07/04/2014   Procedure: CYSTOSCOPY WITH LEFT RETROGRADE PYLEOGRAM, LEFT URETERAL DILATION AND URETEROSCOPY, LASER LITHOTRISPY AND STENT PLACEMENT;  Surgeon: [name], MD;  Location: [facility];  Service: Urology;  Laterality: Left;  . HOLMIUM LASER APPLICATION N/A  07/04/2014   Procedure:  LASER LITHOTRIPSY;  Surgeon: [name], MD;  Location: [facility];  Service: Urology;  Laterality: N/A;  . lithotrypsy     x 3  . POLYPECTOMY    . TONSILLECTOMY      reports that he has quit smoking. He has never used smokeless tobacco. He reports previous alcohol use of about 7.0 standard drinks of alcohol per week. He reports that he does not use drugs. family history includes CAD in his father and mother; Colitis in his sister; Heart disease in an other family member; Heart failure in his father, maternal grandfather, and mother; Hyperlipidemia in an other family member; Hypertension in an other family member; Lupus in his  paternal grandfather; Ulcerative colitis in his sister. Allergies  Allergen Reactions  . Crestor [Rosuvastatin]     REACTION: myalgia  . Lipitor [Atorvastatin]     REACTION: myalgia  . Praluent [Alirocumab]     ineffective  . Repatha [Evolocumab]     ineffective  . Zocor [Simvastatin]     REACTION: myalgia  . Adhesive [Tape] Rash   Current Outpatient Medications on File Prior to Visit  Medication Sig Dispense Refill  . Bempedoic Acid-Ezetimibe (NEXLIZET) 180-10 MG TABS Take 1 tablet by mouth daily. 90 tablet 3  . bisacodyl (DULCOLAX) 5 MG EC tablet Take 5 mg by mouth daily as needed.    . cetirizine (ZYRTEC) 10 MG tablet Take 10 mg by mouth daily as needed for allergies.    Marland Kitchen co-enzyme Q-10 30 MG capsule Take 100 mg by mouth as needed.     . docusate sodium (COLACE) 100 MG capsule Take 100 mg by mouth daily as needed for mild constipation.    . fluconazole (DIFLUCAN) 100 MG tablet Take 1 tablet (100 mg total) by mouth daily. 7 tablet 0  . fluticasone (FLONASE) 50 MCG/ACT nasal spray USE 2 SPRAYS IN EACH  NOSTRIL DAILY 48 g 1  . Melatonin 5 MG TABS Take 1 tablet by mouth as needed.     . metroNIDAZOLE (METROGEL) 1 % gel APPLY TO AFFECTED AREA(S)  TOPICALLY DAILY AS NEEDED  FOR ROSACEA 60 g 2  . Naphazoline-Pheniramine (OPCON-A OP) Apply 1 drop to eye as needed.     . nystatin-triamcinolone (MYCOLOG II) cream Apply 1 application topically 2 (two) times daily. 60 g 0  . OVER THE COUNTER MEDICATION Trace mineral supplement daily- 10 drops    . psyllium (METAMUCIL) 58.6 % powder Take 1 packet by mouth daily.    . rosuvastatin (CRESTOR) 10 MG tablet Take 1 tablet (10 mg total) by mouth daily. 90 tablet 3  . Vitamin D, Cholecalciferol, 10 MCG (400 UNIT) CAPS Take 1 capsule by mouth daily.     No current facility-administered medications on file prior to visit.   Review of Systems All otherwise neg per pt     Objective:   Physical Exam BP (!) 100/58 (BP Location: Left Arm, Patient  Position: Sitting, Cuff Size: Normal)   Pulse (!) 50   Temp 98.1 F (36.7 C) (Oral)   Ht 5\' 7"  (1.702 m)   Wt 134 lb 4 oz (60.9 kg)   SpO2 98%   BMI 21.03 kg/m  VS noted,  Constitutional: Pt appears in NAD HENT: Head: NCAT.  Right Ear: External ear normal.  Left Ear: External ear normal.  Eyes: . Pupils are equal, round, and reactive to light. Conjunctivae and EOM are normal, thyroid 1+ enlarged diffuse Nose: without d/c or deformity  Neck: Neck supple. Gross normal ROM Cardiovascular: Normal rate and regular rhythm.   Pulmonary/Chest: Effort normal and breath sounds without rales or wheezing.  Abd:  Soft, NT, ND, + BS, no organomegaly Neurological: Pt is alert. At baseline orientation, motor grossly intact Skin: Skin is warm. No rashes, other new lesions, no LE edema Psychiatric: Pt behavior is normal without agitation  All otherwise neg per pt Lab Results  Component Value Date   WBC 5.5 10/03/2019   HGB 13.2 10/03/2019   HCT 38.9 (L) 10/03/2019   PLT 196.0 10/03/2019   GLUCOSE 102 (H) 03/23/2020   CHOL 168 03/23/2020   TRIG 33 03/23/2020   HDL 91 03/23/2020   LDLDIRECT 206.1 11/01/2013   LDLCALC 69 03/23/2020   ALT 12 03/23/2020   AST 22 03/23/2020   NA 139 03/23/2020   K 4.1 03/23/2020   CL 101 03/23/2020   CREATININE 1.46 (H) 03/23/2020   BUN 19 03/23/2020   CO2 26 03/23/2020   TSH 1.52 10/03/2019   PSA 1.66 10/03/2019   HGBA1C 5.3 11/13/2019      Assessment & Plan:

## 2020-04-02 NOTE — Patient Instructions (Addendum)
Please continue all other medications as before, and refills have been done if requested.  Please have the pharmacy call with any other refills you may need.  Please continue your efforts at being more active, low cholesterol diet, and weight control.  Please keep your appointments with your specialists as you may have planned  You will be contacted regarding the referral for: thyroid ultrasound, and ENT  Please make an Appointment to return in 3 months, or sooner if needed

## 2020-04-05 ENCOUNTER — Encounter: Payer: Self-pay | Admitting: Internal Medicine

## 2020-04-05 NOTE — Assessment & Plan Note (Signed)
For thyroid u/s, o/w stable overall by history and exam, recent data reviewed with pt, and pt to continue medical treatment as before,  to f/u any worsening symptoms or concerns

## 2020-04-05 NOTE — Assessment & Plan Note (Signed)
stable overall by history and exam, recent data reviewed with pt, and pt to continue medical treatment as before,  to f/u any worsening symptoms or concerns  

## 2020-04-05 NOTE — Assessment & Plan Note (Signed)
Atypical, unexplained, pt requests ent referral

## 2020-04-05 NOTE — Assessment & Plan Note (Addendum)
stable overall by history and exam, recent data reviewed with pt, and pt to continue medical treatment as before,  to f/u any worsening symptoms or concerns  I spent 31 minutes in preparing to see the patient by review of recent labs, imaging and procedures, obtaining and reviewing separately obtained history, communicating with the patient and family or caregiver, ordering medications, tests or procedures, and documenting clinical information in the EHR including the differential Dx, treatment, and any further evaluation and other management of ckd, htn, throat pain, enlarged thyroid

## 2020-04-06 ENCOUNTER — Other Ambulatory Visit: Payer: Self-pay | Admitting: Internal Medicine

## 2020-04-06 DIAGNOSIS — E049 Nontoxic goiter, unspecified: Secondary | ICD-10-CM

## 2020-04-07 NOTE — Telephone Encounter (Signed)
I dont understand this question since pt was seen for this issue July 8  Please let me know if I can be of any further assist

## 2020-04-13 ENCOUNTER — Ambulatory Visit
Admission: RE | Admit: 2020-04-13 | Discharge: 2020-04-13 | Disposition: A | Discharge status: Home / Self Care | Payer: 59 | Source: Ambulatory Visit | Attending: Internal Medicine | Admitting: Internal Medicine

## 2020-04-13 DIAGNOSIS — E049 Nontoxic goiter, unspecified: Secondary | ICD-10-CM

## 2020-05-18 ENCOUNTER — Other Ambulatory Visit: Payer: Self-pay

## 2020-05-18 ENCOUNTER — Encounter (INDEPENDENT_AMBULATORY_CARE_PROVIDER_SITE_OTHER): Payer: Self-pay | Admitting: Otolaryngology

## 2020-05-18 ENCOUNTER — Ambulatory Visit (INDEPENDENT_AMBULATORY_CARE_PROVIDER_SITE_OTHER): Payer: 59 | Admitting: Otolaryngology

## 2020-05-18 VITALS — Temp 97.7°F

## 2020-05-18 DIAGNOSIS — J310 Chronic rhinitis: Secondary | ICD-10-CM | POA: Diagnosis not present

## 2020-05-18 DIAGNOSIS — K219 Gastro-esophageal reflux disease without esophagitis: Secondary | ICD-10-CM

## 2020-05-18 NOTE — Progress Notes (Signed)
HPI: Jerry Rios is a 69 y.o. male who presents is referred by Dr. [name] for evaluation of chronic throat problems.  He apparently has had history of thyroid nodules and had a recent ultrasound performed that demonstrated multiple thyroid nodules that have been stable over the past 5 years.  He has complained of some occasional difficulty swallowing where he has to swallow repeatedly to get food to go down sometimes.  He also notices nasal congestion and occasional trouble breathing at night.  He also complains of trouble breathing through his nose when he puts pressure in the region of his larynx. He has not lost any substantial weight. He has not noted any hoarseness or trouble breathing during the daytime.  Breathing problem seems worse at night. He has used Flonase and Zyrtec in the past for seasonal allergies..  Past Medical History:  Diagnosis Date  . Abdominal pain, unspecified site 05/30/2014  . Allergic rhinitis 04/08/2007   Qualifier: Diagnosis of  By: [name] MD, [name]   . Allergy   . Arthritis    lumbar spine  . BACK PAIN, LUMBAR 07/10/2008   Qualifier: Diagnosis of  By: [name] MD, [name]   . BENIGN PROSTATIC HYPERTROPHY 08/05/2007   Qualifier: Diagnosis of  By: [name] MD, [name]   . BPH (benign prostatic hypertrophy)   . Cataract    starting- mild   . Cervical radiculopathy 08/01/2017  . COLONIC POLYPS, HX OF 08/05/2007   Adenoma polpy per last coonscopy Sep 01, 2006, Dr Henrene Pastor   . Cough 11/12/2009   Qualifier: Diagnosis of  By: [name] MD, Arvid Right.   . Depression 04/08/2007   Qualifier: Diagnosis of  By: [name] MD, [name]   . Diverticulosis of colon   . DIVERTICULOSIS, COLON 08/05/2007   Qualifier: Diagnosis of  By: [name] MD, [place], ATOPIC 11/12/2009   Qualifier: Diagnosis of  By: [name] MD, Arvid Right.   . HEMORRHOIDS 07/10/2008   Qualifier: Diagnosis of  By: [name] MD, [name]   . History of nephrolithiasis   . History of shingles   . Hx of colonic polyp   .  Hyperglycemia 11/19/2014  . Hyperlipidemia   . Hypertension    some past history   . Insomnia   . Insomnia, unspecified 04/08/2007   Centricity Description: SYMPTOM, INSOMNIA NOS Qualifier: Diagnosis of  By: [name] MD, [name]  Centricity Description: INSOMNIA-SLEEP DISORDER-UNSPEC Qualifier: Diagnosis of  By: [name] MD, [place], LEFT 07/10/2008   Qualifier: Diagnosis of  By: [name] MD, [name]   . Libido, decreased 10/04/2011  . Loss of weight 05/30/2014  . Multinodular goiter 12/14/2016  . Nausea in adult patient 05/30/2014  . NEPHROLITHIASIS, HX OF 08/05/2007   Qualifier: Diagnosis of  By: [name] MD, [name]   . RENAL INSUFFICIENCY 09/07/2010   Qualifier: Diagnosis of  By: [name] MD, [name]   . Right leg pain 10/04/2018  . Stroke ([place]) 10/2014  . TIA (transient ischemic attack)   . Vitiligo 10/31/2012   Past Surgical History:  Procedure Laterality Date  . CERVICAL DISC ARTHROPLASTY  12/08/2017  . COLONOSCOPY    . CYSTOSCOPY WITH URETEROSCOPY AND STENT PLACEMENT Left 07/04/2014   Procedure: CYSTOSCOPY WITH LEFT RETROGRADE PYLEOGRAM, LEFT URETERAL DILATION AND URETEROSCOPY, LASER LITHOTRISPY AND STENT PLACEMENT;  Surgeon: [name], MD;  [facility];  Service: Urology;  Laterality: Left;  . HOLMIUM LASER APPLICATION N/A 41/03/4080  Procedure:  LASER LITHOTRIPSY;  Surgeon: [name], MD;  Location: [facility];  Service: Urology;  Laterality: N/A;  . lithotrypsy     x 3  . POLYPECTOMY    . TONSILLECTOMY     Social History   Socioeconomic History  . Marital status: Single    Spouse name: Not on file  . Number of children: Not on file  . Years of education: Not on file  . Highest education level: Not on file  Occupational History  . Occupation: IT  Tobacco Use  . Smoking status: Former Smoker    Packs/day: 0.50    Years: 10.00    Pack years: 5.00  . Smokeless tobacco: Never Used  . Tobacco comment: Regular exercise - No  Substance and Sexual Activity  .  Alcohol use: Not Currently    Alcohol/week: 7.0 standard drinks    Types: 7 Cans of beer per week  . Drug use: No  . Sexual activity: Never  Other Topics Concern  . Not on file  Social History Narrative   Works at [employer], divorce, no biological children   Social Determinants of Health   Financial Resource Strain:   . Difficulty of Paying Living Expenses: Not on file  Food Insecurity:   . Worried About Charity fundraiser in the Last Year: Not on file  . Ran Out of Food in the Last Year: Not on file  Transportation Needs:   . Lack of Transportation (Medical): Not on file  . Lack of Transportation (Non-Medical): Not on file  Physical Activity:   . Days of Exercise per Week: Not on file  . Minutes of Exercise per Session: Not on file  Stress:   . Feeling of Stress : Not on file  Social Connections:   . Frequency of Communication with Friends and Family: Not on file  . Frequency of Social Gatherings with Friends and Family: Not on file  . Attends Religious Services: Not on file  . Active Member of Clubs or Organizations: Not on file  . Attends Archivist Meetings: Not on file  . Marital Status: Not on file   Family History  Problem Relation Age of Onset  . Ulcerative colitis Sister   . Colitis Sister   . Heart disease Other        Grandparents  . Hyperlipidemia Other   . Hypertension Other   . Heart failure Mother   . CAD Mother        died of CAD at age 60  . Heart failure Father   . CAD Father        died from cardiac arrest at 5  . Lupus Paternal Grandfather   . Heart failure Maternal Grandfather   . Colon cancer Neg Hx   . Esophageal cancer Neg Hx   . Pancreatic cancer Neg Hx   . Stomach cancer Neg Hx   . Liver disease Neg Hx   . Colon polyps Neg Hx   . Rectal cancer Neg Hx   . Diabetes Neg Hx    Allergies  Allergen Reactions  . Crestor [Rosuvastatin]     REACTION: myalgia  . Lipitor [Atorvastatin]     REACTION: myalgia  . Praluent  [Alirocumab]     ineffective  . Repatha [Evolocumab]     ineffective  . Zocor [Simvastatin]     REACTION: myalgia  . Adhesive [Tape] Rash   Prior to Admission medications   Medication Sig Start Date End  Date Taking? Authorizing Provider  Bempedoic Acid-Ezetimibe (NEXLIZET) 180-10 MG TABS Take 1 tablet by mouth daily. 10/14/19  Yes Turner, Eber Hong, MD  bisacodyl (DULCOLAX) 5 MG EC tablet Take 5 mg by mouth daily as needed.   Yes [provider]  cetirizine (ZYRTEC) 10 MG tablet Take 10 mg by mouth daily as needed for allergies.   Yes [provider]  co-enzyme Q-10 30 MG capsule Take 100 mg by mouth as needed.    Yes [provider]  docusate sodium (COLACE) 100 MG capsule Take 100 mg by mouth daily as needed for mild constipation.   Yes [provider]  fluconazole (DIFLUCAN) 100 MG tablet Take 1 tablet (100 mg total) by mouth daily. 03/06/20  Yes Marrian Salvage, FNP  fluticasone [facility]) 50 MCG/ACT nasal spray USE 2 SPRAYS IN [facility]  NOSTRIL DAILY 01/15/20  Yes [name], MD  Melatonin 5 MG TABS Take 1 tablet by mouth as needed.    Yes [provider]  metroNIDAZOLE (METROGEL) 1 % gel APPLY TO AFFECTED AREA(S)  TOPICALLY DAILY AS NEEDED  FOR ROSACEA 11/26/18  Yes [name], MD  Naphazoline-Pheniramine (OPCON-A OP) Apply 1 drop to eye as needed.    Yes [provider]  nystatin-triamcinolone (MYCOLOG II) cream Apply 1 application topically 2 (two) times daily. 03/06/20  Yes Marrian Salvage, FNP  psyllium (METAMUCIL) 58.6 % powder Take 1 packet by mouth daily.   Yes [provider]  rosuvastatin (CRESTOR) 10 MG tablet Take 1 tablet (10 mg total) by mouth daily. 10/14/19 10/13/20 Yes Turner, Eber Hong, MD  Vitamin D, Cholecalciferol, 10 MCG (400 UNIT) CAPS Take 1 capsule by mouth daily.   Yes [provider]  OVER THE COUNTER MEDICATION Trace mineral supplement daily- 10 drops    [provider]      Positive ROS: Otherwise negative  All other systems have been reviewed and were otherwise negative with the exception of those mentioned in the HPI and as above.  Physical Exam: Constitutional: Alert, well-appearing, no acute distress.  Patient is having no respiratory problems or trouble breathing through his nose presently. Ears: External ears without lesions or tenderness. Ear canals are clear bilaterally with intact, clear TMs.  Nasal: External nose without lesions. Septum midline with mild rhinitis.  Both middle meatus regions are clear.  No polyps noted..  Both middle meatus regions are clear with no signs of infection. Oral: Lips and gums without lesions. Tongue and palate mucosa without lesions. Posterior oropharynx clear.  Patient is status post tonsillectomy. Fiberoptic laryngoscopy was performed to the left nostril.  The nasopharynx was clear.  The base of tongue was clear.  The vallecula and epiglottis were normal.  Vocal cords were clear bilaterally with normal vocal cord mobility.  He had minimal edema of the arytenoid mucosa.  The fiberoptic laryngoscope was passed through the upper esophageal sphincter without difficulty and the upper cervical esophagus was clear on limited exam. Neck: No palpable adenopathy or supraclavicular adenopathy.  Thyroid nodules are only minimally palpable.Marland Kitchen Respiratory: Breathing comfortably  Skin: No facial/neck lesions or rash noted. Of note in the office on pressure of his larynx he expresses difficulty breathing through his nose although on nasal examination there is no structural abnormality.  Laryngoscopy  Date/Time: 05/18/2020 6:23 PM Performed by: Rozetta Nunnery, MD Authorized by: Rozetta Nunnery, MD   Consent:    Consent obtained:  Verbal   Consent given by:  Patient Procedure details:  Indications: direct visualization of the upper aerodigestive tract     Medication:  Afrin   Instrument: flexible fiberoptic  laryngoscope     Scope location: left nare   Septum:    normal   Sinus:    Right middle meatus: normal     Left middle meatus: normal     Right nasopharynx: normal     Left nasopharynx: normal   Mouth:    Oropharynx: normal     Vallecula: normal     Epiglottis: normal   Throat:    Right hypopharynx: normal     Left hypopharynx: normal     Pyriform sinus: normal     True vocal cords: normal   Comments:     On fiberoptic laryngoscopy the nasal cavity and nasopharynx was clear.  The hypopharynx and larynx were clear with normal examination.  Perhaps mild arytenoid edema where reflux could be contributing to his symptoms.    Assessment: Throat problems with occasional difficulty breathing especially at night. Mild rhinitis with no structural abnormalities noted.  Plan: Prescribed omeprazole 40 mg to take for the next month 1 dose daily before dinner as he may be having some silent reflux contributing to his symptoms. Also recommended regular use of Flonase 2 sprays at night as this will help some with chronic nasal congestion. If he continues to have trouble with breathing at night he may need to have a sleep test scheduled although he does not have the body type of someone with sleep apnea.  He apparently sleeps alone and has no one that can observe his sleeping or breathing at night.   Radene Journey, MD   CC:

## 2020-06-30 ENCOUNTER — Other Ambulatory Visit: Payer: Self-pay | Admitting: Internal Medicine

## 2020-07-02 ENCOUNTER — Telehealth: Payer: Self-pay | Admitting: Pharmacist

## 2020-07-02 NOTE — Telephone Encounter (Signed)
Patient called stating he needs a PA for his nexlizet. He states that he did stop taking it for about 3 weeks bc his hands and feet hurt, but it improved and he resumed. States its not as bad. Requesting that if the pain gets bad if he could have his uric acid checks. Advised that this would be ok, to call us if it worsens. PA submitted via covermymeds

## 2020-08-13 ENCOUNTER — Other Ambulatory Visit: Payer: Self-pay | Admitting: Cardiology

## 2020-08-22 ENCOUNTER — Emergency Department (HOSPITAL_COMMUNITY)
Admission: EM | Admit: 2020-08-22 | Discharge: 2020-08-23 | Disposition: A | Discharge status: Home / Self Care | Payer: 59 | Attending: Emergency Medicine | Admitting: Emergency Medicine

## 2020-08-22 ENCOUNTER — Other Ambulatory Visit: Payer: Self-pay

## 2020-08-22 ENCOUNTER — Encounter (HOSPITAL_COMMUNITY): Payer: Self-pay | Admitting: *Deleted

## 2020-08-22 DIAGNOSIS — Z87891 Personal history of nicotine dependence: Secondary | ICD-10-CM | POA: Insufficient documentation

## 2020-08-22 DIAGNOSIS — R21 Rash and other nonspecific skin eruption: Secondary | ICD-10-CM | POA: Insufficient documentation

## 2020-08-22 DIAGNOSIS — I129 Hypertensive chronic kidney disease with stage 1 through stage 4 chronic kidney disease, or unspecified chronic kidney disease: Secondary | ICD-10-CM | POA: Insufficient documentation

## 2020-08-22 DIAGNOSIS — M79602 Pain in left arm: Secondary | ICD-10-CM | POA: Diagnosis not present

## 2020-08-22 DIAGNOSIS — R001 Bradycardia, unspecified: Secondary | ICD-10-CM | POA: Insufficient documentation

## 2020-08-22 DIAGNOSIS — Z79899 Other long term (current) drug therapy: Secondary | ICD-10-CM | POA: Diagnosis not present

## 2020-08-22 DIAGNOSIS — N183 Chronic kidney disease, stage 3 unspecified: Secondary | ICD-10-CM | POA: Insufficient documentation

## 2020-08-22 NOTE — ED Triage Notes (Signed)
The pt is c/o rash to his lt arm for one week  No better  Has been draining

## 2020-08-23 MED ORDER — PREDNISONE 10 MG PO TABS: 40.0000 mg | ORAL_TABLET | Freq: Every day | ORAL | 0 refills | Status: AC

## 2020-08-23 NOTE — Discharge Instructions (Addendum)
You were seen in the ER today for a rash.  We are sending you home with a prescription for a course of steroids. Please start these tomorrow morning.   We have prescribed you new medication(s) today. Discuss the medications prescribed today with your pharmacist as they can have adverse effects and interactions with your other medicines including over the counter and prescribed medications. Seek medical evaluation if you start to experience new or abnormal symptoms after taking one of these medicines, seek care immediately if you start to experience difficulty breathing, feeling of your throat closing, facial swelling, or rash as these could be indications of a more serious allergic reaction  We would like you to follow up with your primary care provider within 3 days for re-evaluation. We have also provided information for dermatology follow up.  Return to the ER for new or worsening symptoms including but not limited to worsening rash, fever, rash in your mouth or on your palms/soles, neck stiffness, trouble breathing, facial swelling, or any other concerns.   [facility] Dermatologists:   [facility]  3.2 [phone])  Dermatologist  [place] [address] [place]  [phone]   Dr. Michelene Gardener, MD  2.6 [phone])  Dermatologist  [place]  [phone]  [facility]  3.5 (3)  [facility]  [place]  [phone]   [place]  4.0 (4)  Dermatologist  [place]  [phone]  Lavonna Monarch MD  3.0 (2)  Dermatologist  [place]  [phone]  Katrina Stack  2.7 (6)  Dermatologist  [place]  [phone]  [place] Amy Y MD  2.0 (1)  Dermatologist  [place]  [phone]  [place]  5.0 (3)  Doctor  [address]  [phone]

## 2020-08-23 NOTE — ED Provider Notes (Signed)
[facility] EMERGENCY DEPARTMENT Provider Note   CSN: [id number] Arrival date & time: 08/22/20  2227     History Chief Complaint  Patient presents with   Rash    Jerry Rios is a 69 y.o. male with a history of prior stroke, eczema, depression, prior shingles, hypertension, & hyperlipidemia who presents to the ED with complaints of worsening rash over the past 1 week. Patient states that rash started as areas of redness to the left forearm with progressive worsening in this location. He has also had some spots to the abdomen & lower extremities however these frequently occur with the cold weather. The left arm area is mildly painful at times and has become swollen, does itch at times. No alleviating/aggravating factors. Has tried applying topical abx ointment without relief. Denies fever, chills, nausea, vomiting, dyspnea, throat closing sensation, or facial swelling. No new medications. No recent new products/exposures that he is aware of. He does work outside, no known exposure to anything or known insect/tick bites.   HPI     Past Medical History:  Diagnosis Date   Abdominal pain, unspecified site 05/30/2014   Allergic rhinitis 04/08/2007   Qualifier: Diagnosis of  By: [name] MD, [name]    Allergy    Arthritis    lumbar spine   BACK PAIN, LUMBAR 07/10/2008   Qualifier: Diagnosis of  By: [name] MD, [place] HYPERTROPHY 08/05/2007   Qualifier: Diagnosis of  By: [name] MD, [name]    BPH (benign prostatic hypertrophy)    Cataract    starting- mild    Cervical radiculopathy 08/01/2017   COLONIC POLYPS, HX OF 08/05/2007   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    Depression 04/08/2007   Qualifier: Diagnosis of  By: [name] MD, [name]    Diverticulosis of colon    DIVERTICULOSIS, COLON 08/05/2007   Qualifier: Diagnosis of  By: [name] MD, [place], ATOPIC 11/12/2009   Qualifier: Diagnosis of  By: [name] MD, Arvid Right.    HEMORRHOIDS 07/10/2008   Qualifier: Diagnosis of  By: [name] MD, [name]    History of nephrolithiasis    History of shingles    Hx of colonic polyp    Hyperglycemia 11/19/2014   Hyperlipidemia    Hypertension    some past history    Insomnia    Insomnia, unspecified 04/08/2007   Centricity Description: SYMPTOM, INSOMNIA NOS Qualifier: Diagnosis of  By: [name] MD, [name]  Centricity Description: INSOMNIA-SLEEP DISORDER-UNSPEC Qualifier: Diagnosis of  By: [name] MD, [name]    KNEE PAIN, LEFT 07/10/2008   Qualifier: Diagnosis of  By: [name] MD, [name]    Libido, decreased 10/04/2011   Loss of weight 05/30/2014   Multinodular goiter 12/14/2016   Nausea in adult patient 05/30/2014   NEPHROLITHIASIS, HX OF 08/05/2007   Qualifier: Diagnosis of  By: [name] MD, [name]    RENAL INSUFFICIENCY 09/07/2010   Qualifier: Diagnosis of  By: [name] MD, [name]    Right leg pain 10/04/2018   Stroke ([place]) 10/2014   TIA (transient ischemic attack)    Vitiligo 10/31/2012    Patient Active Problem List   Diagnosis Date Noted   Enlarged thyroid 04/02/2020   Throat pain 04/02/2020   CKD (chronic kidney disease) stage 3, GFR 30-59 ml/min ([place])  11/13/2019   Weight loss 03/07/2019   Right leg pain 10/04/2018   Cervical radiculopathy 08/01/2017   Multinodular goiter 12/14/2016   HTN (hypertension) 07/21/2016   Hyperglycemia 11/19/2014   TIA (transient ischemic attack) 11/18/2014   Abdominal pain, unspecified site 05/30/2014   Nausea in adult patient 05/30/2014   Loss of weight 05/30/2014   Vitiligo 10/31/2012   Libido, decreased 10/04/2011   Preventative health care 10/01/2011   RENAL INSUFFICIENCY 09/07/2010   ECZEMA, ATOPIC 11/12/2009   HEMORRHOIDS 07/10/2008   BACK PAIN, LUMBAR 07/10/2008   Hyperlipidemia 08/05/2007   DIVERTICULOSIS, COLON 08/05/2007   BENIGN PROSTATIC HYPERTROPHY  08/05/2007   COLONIC POLYPS, HX OF 08/05/2007   NEPHROLITHIASIS, HX OF 08/05/2007   Depression 04/08/2007   Allergic rhinitis 04/08/2007   Insomnia, unspecified 04/08/2007    Past Surgical History:  Procedure Laterality Date   CERVICAL DISC ARTHROPLASTY  12/08/2017   COLONOSCOPY     CYSTOSCOPY WITH URETEROSCOPY AND STENT PLACEMENT Left 07/04/2014   Procedure: CYSTOSCOPY WITH LEFT RETROGRADE PYLEOGRAM, LEFT URETERAL DILATION AND URETEROSCOPY, LASER LITHOTRISPY AND STENT PLACEMENT;  Surgeon: [name], MD;  Location: [facility];  Service: Urology;  Laterality: Left;   HOLMIUM LASER APPLICATION N/A 25/05/5637   Procedure:  LASER LITHOTRIPSY;  Surgeon: [name], MD;  Location: [facility];  Service: Urology;  Laterality: N/A;   lithotrypsy     x 3   POLYPECTOMY     TONSILLECTOMY         Family History  Problem Relation Age of Onset   Ulcerative colitis Sister    Colitis Sister    Heart disease Other        Grandparents   Hyperlipidemia Other    Hypertension Other    Heart failure Mother    CAD Mother        died of CAD at age 93   Heart failure Father    CAD Father        died from cardiac arrest at 32   Lupus Paternal Grandfather    Heart failure Maternal Grandfather    Colon cancer Neg Hx    Esophageal cancer Neg Hx    Pancreatic cancer Neg Hx    Stomach cancer Neg Hx    Liver disease Neg Hx    Colon polyps Neg Hx    Rectal cancer Neg Hx    Diabetes Neg Hx     Social History   Tobacco Use   Smoking status: Former Smoker    Packs/day: 0.50    Years: 10.00    Pack years: 5.00   Smokeless tobacco: Never Used   Tobacco comment: Regular exercise - No  Substance Use Topics   Alcohol use: Not Currently    Alcohol/week: 7.0 standard drinks    Types: 7 Cans of beer per week   Drug use: No    Home Medications Prior to Admission medications   Medication Sig Start Date End Date Taking? Authorizing Provider  Bempedoic  Acid-Ezetimibe (NEXLIZET) 180-10 MG TABS Take 1 tablet by mouth daily. 10/14/19   Sueanne Margarita, MD  bisacodyl (DULCOLAX) 5 MG EC tablet Take 5 mg by mouth daily as needed.    [provider]  cetirizine (ZYRTEC) 10 MG tablet Take 10 mg by mouth daily as needed for allergies.    [provider]  co-enzyme Q-10 30 MG capsule Take 100 mg by mouth as needed.     [provider]  docusate sodium (COLACE) 100 MG capsule  Take 100 mg by mouth daily as needed for mild constipation.    [provider]  fluconazole (DIFLUCAN) 100 MG tablet Take 1 tablet (100 mg total) by mouth daily. 03/06/20   Marrian Salvage, FNP  fluticasone [facility]) 50 MCG/ACT nasal spray USE 2 SPRAYS IN [facility]  NOSTRIL DAILY 01/15/20   [name], MD  Melatonin 5 MG TABS Take 1 tablet by mouth as needed.     [provider]  metroNIDAZOLE (METROGEL) 1 % gel APPLY TO AFFECTED AREA(S)  TOPICALLY DAILY AS NEEDED  FOR ROSACEA 06/30/20   [name], MD  Naphazoline-Pheniramine (OPCON-A OP) Apply 1 drop to eye as needed.     [provider]  nystatin-triamcinolone (MYCOLOG II) cream Apply 1 application topically 2 (two) times daily. 03/06/20   Marrian Salvage, FNP  OVER THE COUNTER MEDICATION Trace mineral supplement daily- 10 drops    [provider]  psyllium (METAMUCIL) 58.6 % powder Take 1 packet by mouth daily.    [provider]  rosuvastatin (CRESTOR) 10 MG tablet Take 1 tablet (10 mg total) by mouth daily. Please make overdue appt with Dr. Radford Pax before anymore refills. Thank you 1st attempt 08/13/20   Sueanne Margarita, MD  Vitamin D, Cholecalciferol, 10 MCG (400 UNIT) CAPS Take 1 capsule by mouth daily.    [provider]    Allergies    Crestor [rosuvastatin], Lipitor [atorvastatin], Praluent [alirocumab], Repatha [evolocumab], Zocor [simvastatin], and Adhesive [tape]  Review of Systems   Review of Systems  Constitutional: Negative  for chills and fever.  HENT: Negative for facial swelling, trouble swallowing and voice change.   Respiratory: Negative for cough and shortness of breath.   Cardiovascular: Negative for chest pain.  Gastrointestinal: Negative for abdominal pain, nausea and vomiting.  Musculoskeletal: Positive for myalgias.  Skin: Positive for wound.  Neurological: Negative for syncope, weakness and numbness.  All other systems reviewed and are negative.   Physical Exam Updated Vital Signs BP 129/62    Pulse (!) 50    Temp 98.4 F (36.9 C)    Resp 16    Ht 5\' 7"  (1.702 m)    Wt 60.9 kg    SpO2 100%    BMI 21.03 kg/m   Physical Exam Vitals and nursing note reviewed.  Constitutional:      General: He is not in acute distress.    Appearance: Normal appearance. He is well-developed. He is not ill-appearing or toxic-appearing.  HENT:     Head: Normocephalic and atraumatic.     Mouth/Throat:     Mouth: Mucous membranes are moist.     Comments: No obvious MM lesions.  Eyes:     General:        Right eye: No discharge.        Left eye: No discharge.     Conjunctiva/sclera: Conjunctivae normal.  Neck:     Comments: No midline tenderness.  Cardiovascular:     Rate and Rhythm: Regular rhythm. Bradycardia present.     Pulses:          Radial pulses are 2+ on the right side and 2+ on the left side.  Pulmonary:     Effort: Pulmonary effort is normal. No respiratory distress.     Breath sounds: Normal breath sounds. No wheezing, rhonchi or rales.  Abdominal:     General: There is no distension.     Palpations: Abdomen is soft.     Tenderness: There is  no abdominal tenderness.  Musculoskeletal:     Cervical back: Normal range of motion and neck supple. No rigidity.     Comments: Upper extremities: See skin exam regarding rash. Mild left forearm swelling noted dorsally. Intact AROM throughout. No focal tenderness to palpation.  Lower extremities: See skin exam for further details. Intact AROM  throughout. No focal tenderness.   Soft compartments throughout extremities.   Skin:    General: Skin is warm and dry.     Capillary Refill: Capillary refill takes less than 2 seconds.     Findings: Rash present.     Comments: L forearm: dorsal forearm with thickened skin and erythematous rash in nonspecific pattern. Not overly hot to the touch. No induration/fluctuance. No gross abscess. No palm/sole involvement.  Abdomen/LEs: few scattered very small areas of erythema.   Neurological:     Mental Status: He is alert.     Comments: Alert. Clear speech. Sensation grossly intact to bilateral upper extremities. 5/5 symmetric grip strength. Ambulatory.   Psychiatric:        Mood and Affect: Mood normal.        Behavior: Behavior normal.           ED Results / Procedures / Treatments   Labs (all labs ordered are listed, but only abnormal results are displayed) Labs Reviewed - No data to display  EKG None  Radiology No results found.  Procedures Procedures (including critical care time)  Medications Ordered in ED Medications - No data to display  ED Course  I have reviewed the triage vital signs and the nursing notes.  Pertinent labs & imaging results that were available during my care of the patient were reviewed by me and considered in my medical decision making (see chart for details).    MDM Rules/Calculators/A&P                         Patient presents to the ED with progressively worsening rash to the L forearm x 1 week.  Nontoxic, vitals with mild bradycardia- otherwise unremarkable.  Exam does not seem consistent w/ cellulitis or abscess at this time.  NVI distally, soft compartment- does not seem consistent w/ compartment syndrome.  Not particularly tender to palpation.  No palm/sole or MM involvement.  Overall unclear etiology. I discussed findings & plan of care with supervising physician Dr. Roxanne Mins who has personally evaluated patient as shared visit-  recommends discharge home with short steroid burst- PCP follow up- I am in agreement. We discussed treatment plan, need for follow-up, and return precautions with the patient. Provided opportunity for questions, patient confirmed understanding and is in agreement with plan.   Final Clinical Impression(s) / ED Diagnoses Final diagnoses:  Rash    Rx / DC Orders ED Discharge Orders         Ordered    predniSONE (DELTASONE) 10 MG tablet  Daily        08/23/20 0057           Amaryllis Dyke, PA-C 75/17/00 1749    Delora Fuel, MD 44/96/75 [phone]

## 2020-10-05 ENCOUNTER — Telehealth: Payer: Self-pay | Admitting: Pharmacist

## 2020-10-05 ENCOUNTER — Encounter: Payer: Self-pay | Admitting: Gastroenterology

## 2020-10-05 ENCOUNTER — Ambulatory Visit (INDEPENDENT_AMBULATORY_CARE_PROVIDER_SITE_OTHER): Payer: Medicare Other | Admitting: Gastroenterology

## 2020-10-05 VITALS — BP 90/50 | HR 56 | Ht 67.0 in | Wt 139.2 lb

## 2020-10-05 DIAGNOSIS — R110 Nausea: Secondary | ICD-10-CM | POA: Diagnosis not present

## 2020-10-05 DIAGNOSIS — R109 Unspecified abdominal pain: Secondary | ICD-10-CM

## 2020-10-05 DIAGNOSIS — I2510 Atherosclerotic heart disease of native coronary artery without angina pectoris: Secondary | ICD-10-CM

## 2020-10-05 DIAGNOSIS — R1013 Epigastric pain: Secondary | ICD-10-CM

## 2020-10-05 DIAGNOSIS — K219 Gastro-esophageal reflux disease without esophagitis: Secondary | ICD-10-CM | POA: Diagnosis not present

## 2020-10-05 DIAGNOSIS — K581 Irritable bowel syndrome with constipation: Secondary | ICD-10-CM

## 2020-10-05 DIAGNOSIS — Z87442 Personal history of urinary calculi: Secondary | ICD-10-CM

## 2020-10-05 MED ORDER — ROSUVASTATIN CALCIUM 10 MG PO TABS
10.0000 mg | ORAL_TABLET | Freq: Every day | ORAL | 0 refills | Status: DC
Start: 2020-10-05 — End: 2020-12-24

## 2020-10-05 MED ORDER — NEXLIZET 180-10 MG PO TABS: 1.0000 | ORAL_TABLET | Freq: Every day | ORAL | 3 refills | Status: DC

## 2020-10-05 NOTE — Telephone Encounter (Addendum)
Pt called stating Nexlizet needed new rx sent in since pharmacy told him he didn't have any refills. He also stated that he changed insurance plans to [facility]. He will need a new authorization sent for him to continue on Nexlizet.  New insurance, still commercial: ID: [id number] BIN: [id number] PCN: [id number] GRP: [id number]  New prior authorization has been sent in and approved through 09/25/21. Pt also requesting refill on rosuvastatin. Advised him that he is overdue for annual appt with Dr Radford Pax. Provided him with main # to schedule annual follow up, did sent in 1 refill on rosuvastatin.

## 2020-10-05 NOTE — Patient Instructions (Signed)
You have been scheduled for an endoscopy. Please follow written instructions given to you at your visit today. If you use inhalers (even only as needed), please bring them with you on the day of your procedure.   You have been scheduled for a CT scan of the abdomen and pelvis at [facility] are scheduled on  at . You should arrive 15 minutes prior to your appointment time for registration. Please follow the written instructions below on the day of your exam:  WARNING: IF YOU ARE ALLERGIC TO IODINE/X-RAY DYE, PLEASE NOTIFY RADIOLOGY IMMEDIATELY AT [facility].  1) Do not eat or drink anything after 5am (4 hours prior to your test) 2) You have been given 2 bottles of oral contrast to drink. The solution may taste better if refrigerated, but do NOT add ice or any other liquid to this solution. Shake well before drinking.    Drink 1 bottle of contrast @ 7am (2 hours prior to your exam)  Drink 1 bottle of contrast @ 8am (1 hour prior to your exam)  You may take any medications as prescribed with a small amount of water, if necessary. If you take any of the following medications: METFORMIN, GLUCOPHAGE, GLUCOVANCE, AVANDAMET, RIOMET, FORTAMET, [place] MET, JANUMET, GLUMETZA or METAGLIP, you MAY be asked to HOLD this medication 48 hours AFTER the exam.  The purpose of you drinking the oral contrast is to aid in the visualization of your intestinal tract. The contrast solution may cause some diarrhea. Depending on your individual set of symptoms, you may also receive an intravenous injection of x-ray contrast/dye. Plan on being at [facility] for 30 minutes or longer, depending on the type of exam you are having performed.  This test typically takes 30-45 minutes to complete.  If you have any questions regarding your exam or if you need to reschedule, you may call the CT department at [phone] between the hours of 8:00 am  and 5:00 pm, Monday-Friday.  Take benefiber 1 teaspoon 2-3 times daily with meals  We will send Linzess to your pharmacy  Increase water intake to 8-10 cups daily  Due to recent changes in healthcare laws, you may see the results of your imaging and laboratory studies on MyChart before your provider has had a chance to review them.  We understand that in some cases there may be results that are confusing or concerning to you. Not all laboratory results come back in the same time frame and the provider may be waiting for multiple results in order to interpret others.  Please give [place] 48 hours in order for your provider to thoroughly review all the results before contacting the office for clarification of your results.   I appreciate the  opportunity to care for you  Thank You   Harl Bowie , MD

## 2020-10-05 NOTE — Progress Notes (Signed)
Jerry Rios    [id number]    [birth year]  Primary Care Physician:Jerry Rios, Jerry Oris, MD  Referring Physician: [name], MD [address] [place],  [place] [id number]   Chief complaint:  SIBO  HPI:  70 year old very Jerry gentleman here for follow-up visit with complaints of constipation, abdominal bloating, left side discomfort and intermittent nausea.  He has been experiencing worsening constipation since October, which worsened in November but his bowel movements have improved in the past 3 months.  He started taking magnesium citrate every night and also taking small amount of psyllium  He has taken probiotics with no improvement.  He continues to have significant abdominal bloating.  Colonoscopy 01/03/2017: By Dr. Henrene Rios showed diverticulosis and hemorrhoids otherwise unremarkable exam  Outpatient Encounter Medications as of 10/05/2020  Medication Sig  . B Complex-C (SUPER B COMPLEX/VITAMIN C) TABS Take 1 tablet by mouth daily.  . Bempedoic Acid-Ezetimibe (NEXLIZET) 180-10 MG TABS Take 1 tablet by mouth daily.  . bisacodyl (DULCOLAX) 5 MG EC tablet Take 5 mg by mouth daily as needed.  . cetirizine (ZYRTEC) 10 MG tablet Take 10 mg by mouth daily as needed for allergies.  Marland Kitchen co-enzyme Q-10 30 MG capsule Take 100 mg by mouth as needed.   . docusate sodium (COLACE) 100 MG capsule Take 100 mg by mouth daily as needed for mild constipation.  . fluticasone (FLONASE) 50 MCG/ACT nasal spray USE 2 SPRAYS IN EACH  NOSTRIL DAILY  . Magnesium 500 MG CAPS Take 1 tablet by mouth daily.  . Melatonin 5 MG TABS Take 1 tablet by mouth as needed.   . metroNIDAZOLE (METROGEL) 1 % gel APPLY TO AFFECTED AREA(S)  TOPICALLY DAILY AS NEEDED  FOR ROSACEA  . Naphazoline-Pheniramine (OPCON-A OP) Apply 1 drop to eye as needed.   . nystatin-triamcinolone (MYCOLOG II) cream Apply 1 application topically 2 (two) times daily.  . psyllium (METAMUCIL) 58.6 % powder Take 1 packet by mouth  daily.  . rosuvastatin (CRESTOR) 10 MG tablet Take 1 tablet (10 mg total) by mouth daily.  . Vitamin D, Cholecalciferol, 10 MCG (400 UNIT) CAPS Take 1 capsule by mouth daily.  Marland Kitchen OVER THE COUNTER MEDICATION Trace mineral supplement daily- 10 drops  . [DISCONTINUED] fluconazole (DIFLUCAN) 100 MG tablet Take 1 tablet (100 mg total) by mouth daily.   No facility-administered encounter medications on file as of 10/05/2020.    Allergies as of 10/05/2020 - Review Complete 10/05/2020  Allergen Reaction Noted  . Crestor [rosuvastatin]  04/08/2007  . Lipitor [atorvastatin]  04/08/2007  . Praluent [alirocumab]  10/14/2019  . Repatha [evolocumab]  10/14/2019  . Zocor [simvastatin]  04/08/2007  . Adhesive [tape] Rash 06/20/2014    Past Medical History:  Diagnosis Date  . Abdominal pain, unspecified site 05/30/2014  . Allergic rhinitis 04/08/2007   Qualifier: Diagnosis of  By: Jerry Reichmann MD, Jerry Rios   . Allergy   . Arthritis    lumbar spine  . Atherosclerosis   . BACK PAIN, LUMBAR 07/10/2008   Qualifier: Diagnosis of  By: Jerry Reichmann MD, Jerry Rios   . BENIGN PROSTATIC HYPERTROPHY 08/05/2007   Qualifier: Diagnosis of  By: Jerry Reichmann MD, Jerry Rios   . BPH (benign prostatic hypertrophy)   . Cataract    starting- mild   . Cervical radiculopathy 08/01/2017  . CKD (chronic kidney disease), stage III (Jerry Rios)   . COLONIC POLYPS, HX OF 08/05/2007   Adenoma polpy per last coonscopy  Sep 01, 2006, Dr Jerry Rios   . Cough 11/12/2009   Qualifier: Diagnosis of  By: Jerry Ramp MD, Jerry Rios.   . Depression 04/08/2007   Qualifier: Diagnosis of  By: Jerry Reichmann MD, Jerry Rios   . Diverticulosis of colon   . DIVERTICULOSIS, COLON 08/05/2007   Qualifier: Diagnosis of  By: Jerry Reichmann MD, Jerry Rios, ATOPIC 11/12/2009   Qualifier: Diagnosis of  By: Jerry Ramp MD, Jerry Rios.   . HEMORRHOIDS 07/10/2008   Qualifier: Diagnosis of  By: Jerry Reichmann MD, Jerry Rios   . History of nephrolithiasis   . History of shingles   . Hx of colonic polyp   . Hyperglycemia 11/19/2014  .  Hyperlipidemia   . Hypertension    some past history   . Insomnia   . Insomnia, unspecified 04/08/2007   Centricity Description: SYMPTOM, INSOMNIA NOS Qualifier: Diagnosis of  By: Jerry Reichmann MD, Jerry Rios  Centricity Description: INSOMNIA-SLEEP DISORDER-UNSPEC Qualifier: Diagnosis of  By: Jerry Reichmann MD, [place], LEFT 07/10/2008   Qualifier: Diagnosis of  By: Jerry Reichmann MD, Jerry Rios   . Libido, decreased 10/04/2011  . Loss of weight 05/30/2014  . Multinodular goiter 12/14/2016  . Nausea in adult patient 05/30/2014  . NEPHROLITHIASIS, HX OF 08/05/2007   Qualifier: Diagnosis of  By: Jerry Reichmann MD, Jerry Rios   . RENAL INSUFFICIENCY 09/07/2010   Qualifier: Diagnosis of  By: Jerry Reichmann MD, Jerry Rios   . Rios leg pain 10/04/2018  . Stroke ([place]) 10/2014  . TIA (transient ischemic attack)   . Vitiligo 10/31/2012    Past Surgical History:  Procedure Laterality Date  . CERVICAL DISC ARTHROPLASTY  12/08/2017  . COLONOSCOPY    . CYSTOSCOPY WITH URETEROSCOPY AND STENT PLACEMENT Left 07/04/2014   Procedure: CYSTOSCOPY WITH LEFT RETROGRADE PYLEOGRAM, LEFT URETERAL DILATION AND URETEROSCOPY, LASER LITHOTRISPY AND STENT PLACEMENT;  Surgeon: [name], MD;  Location: [facility];  Service: Urology;  Laterality: Left;  . HOLMIUM LASER APPLICATION N/A [id number]   Procedure:  LASER LITHOTRIPSY;  Surgeon: [name], MD;  Location: [facility];  Service: Urology;  Laterality: N/A;  . lithotrypsy     x 3  . POLYPECTOMY    . TONSILLECTOMY      Family History  Problem Relation Age of Onset  . Ulcerative colitis Sister   . Colitis Sister   . Heart disease Other        Grandparents  . Hyperlipidemia Other   . Hypertension Other   . Heart failure Mother   . CAD Mother        died of CAD at age 48  . Heart failure Father   . CAD Father        died from cardiac arrest at 50  . Lupus Paternal Grandfather   . Heart failure Maternal Grandfather   . Colon cancer Neg Hx   . Esophageal cancer Neg Hx   . Pancreatic cancer Neg Hx    . Stomach cancer Neg Hx   . Liver disease Neg Hx   . Colon polyps Neg Hx   . Rectal cancer Neg Hx   . Diabetes Neg Hx     Social History   Socioeconomic History  . Marital status: Single    Spouse name: Not on file  . Number of children: Not on file  . Years of education: Not on file  . Highest education level: Not on file  Occupational History  . Occupation: IT  Tobacco Use  .  Smoking status: Former Smoker    Packs/day: 0.50    Years: 10.00    Pack years: 5.00  . Smokeless tobacco: Never Used  . Tobacco comment: Regular exercise - No  Substance and Sexual Activity  . Alcohol use: Not Currently    Alcohol/week: 7.0 standard drinks    Types: 7 Cans of beer per week  . Drug use: No  . Sexual activity: Never  Other Topics Concern  . Not on file  Social History Narrative   Works at [employer], divorce, no biological children   Social Determinants of Radio broadcast assistant Strain: Not on file  Food Insecurity: Not on file  Transportation Needs: Not on file  Physical Activity: Not on file  Stress: Not on file  Social Connections: Not on file  Intimate Partner Violence: Not on file      Review of systems: All other review of systems negative except as mentioned in the HPI.   Physical Exam: Vitals:   10/05/20 1458  BP: (!) 90/50  Pulse: (!) 56   Body mass index is 21.81 kg/m. Gen:      No acute distress HEENT:  sclera anicteric Abd:      soft, non-tender; no palpable masses, no distension Ext:    No edema Neuro: alert and oriented x 3 Psych: normal mood and affect  Data Reviewed:  Reviewed labs, radiology imaging, old records and pertinent past GI work up   Assessment and Plan/Recommendations:  70 year old very Jerry gentleman here for second opinion with complaints of change in bowel habits, abdominal discomfort, bloating and intermittent nausea  We will do a trial of low-dose Linzess 72 mcg daily Start Benefiber 1 teaspoon 2-3 times  daily with meals Increase water intake to 8 to 10 cups daily  We will obtain CT abdomen and pelvis to exclude neoplastic lesion or any acute intra-abdominal pathology/diverticular disease or nephrolithiasis  Intermittent epigastric abdominal pain and nausea.  Will need to exclude uncontrolled GERD, erosive esophagitis, gastritis or peptic ulcer disease.  We will plan to proceed with EGD for further evaluation.  We will also obtain biopsies to exclude H. pylori bacterial infection and evaluate duodenum for possible sprue  The risks and benefits as well as alternatives of endoscopic procedure(s) have been discussed and reviewed. All questions answered. The patient agrees to proceed.  Stop probiotics  If above work-up is unrevealing, will consider empiric treatment with Xifaxan for possible SIBO   This visit required >35 minutes of patient care (this includes precharting, chart review, review of results, face-to-face time used for counseling as well as treatment plan and follow-up. The patient was provided an opportunity to ask questions and all were answered. The patient agreed with the plan and demonstrated an understanding of the instructions.  Damaris Hippo , MD    CC: Jerry Borg, MD

## 2020-10-06 MED ORDER — NEXLIZET 180-10 MG PO TABS: 1.0000 | ORAL_TABLET | Freq: Every day | ORAL | 3 refills | Status: DC

## 2020-10-06 NOTE — Telephone Encounter (Signed)
Pt called clinic stating 3 month supply of Nexletol is going to cost > $600 and his copay card needed to be reactivated. I reactivated his card and called his pharmacy who stated he has Medicare Part D, not commercial insurance now like he did last year. When I asked pt this question yesterday, he stated he had a commercial plan.  Called pt back, no answer. Will ask him again about type of prescription insurance. Can pursue [place] if he does have Medicare.

## 2020-10-06 NOTE — Addendum Note (Signed)
Addended by: Hang Ammon E on: 10/06/2020 05:07 PM   Modules accepted: Orders

## 2020-10-06 NOTE — Telephone Encounter (Signed)
Pt returned call to clinic, he does have Medicare Part D now so he is ineligible for copay card. His annual income is also too high for the [employer]. He is ok paying through his deductible but wanted rx sent to [facility] which I have sent in. Advised pt to call clinic when he retires which he's planning to do this year. Can then apply for [employer].

## 2020-10-09 ENCOUNTER — Telehealth: Payer: Self-pay | Admitting: *Deleted

## 2020-10-09 ENCOUNTER — Encounter: Payer: Self-pay | Admitting: Internal Medicine

## 2020-10-09 ENCOUNTER — Other Ambulatory Visit: Payer: Self-pay | Admitting: *Deleted

## 2020-10-09 DIAGNOSIS — M545 Low back pain, unspecified: Secondary | ICD-10-CM

## 2020-10-09 DIAGNOSIS — M542 Cervicalgia: Secondary | ICD-10-CM

## 2020-10-09 DIAGNOSIS — R109 Unspecified abdominal pain: Secondary | ICD-10-CM

## 2020-10-09 NOTE — Telephone Encounter (Signed)
Put in orders for pt to have a BMET in before his CT scan which will have to be rescheduled

## 2020-10-12 ENCOUNTER — Ambulatory Visit (HOSPITAL_BASED_OUTPATIENT_CLINIC_OR_DEPARTMENT_OTHER): Admission: RE | Admit: 2020-10-12 | Payer: Medicare Other | Source: Ambulatory Visit

## 2020-10-14 ENCOUNTER — Encounter: Payer: Self-pay | Admitting: Cardiology

## 2020-10-14 ENCOUNTER — Other Ambulatory Visit: Payer: Self-pay

## 2020-10-14 ENCOUNTER — Ambulatory Visit (INDEPENDENT_AMBULATORY_CARE_PROVIDER_SITE_OTHER): Payer: Medicare Other | Admitting: Cardiology

## 2020-10-14 VITALS — BP 110/60 | HR 54 | Ht 67.0 in | Wt 140.0 lb

## 2020-10-14 DIAGNOSIS — E7800 Pure hypercholesterolemia, unspecified: Secondary | ICD-10-CM

## 2020-10-14 DIAGNOSIS — I10 Essential (primary) hypertension: Secondary | ICD-10-CM

## 2020-10-14 DIAGNOSIS — I2510 Atherosclerotic heart disease of native coronary artery without angina pectoris: Secondary | ICD-10-CM

## 2020-10-14 NOTE — Addendum Note (Signed)
Addended by: Antonieta Iba on: 10/14/2020 03:52 PM   Modules accepted: Orders

## 2020-10-14 NOTE — Patient Instructions (Addendum)
Medication Instructions:  Your physician has recommended you make the following change in your medication:  1) STOP taking Nexilant  *If you need a refill on your cardiac medications before your next appointment, please call your pharmacy*  Lab Work: Uric acid - go to [facility] to have this drawn If you have labs (blood work) drawn today and your tests are completely normal, you will receive your results only by: Marland Kitchen MyChart Message (if you have MyChart) OR . A paper copy in the mail If you have any lab test that is abnormal or we need to change your treatment, we will call you to review the results.   Testing/Procedures: Your physician has requested that you have an exercise tolerance test. For further information please visit [email or web address]. Please also follow instruction sheet, as given.  Follow-Up: At [facility], you and your health needs are our priority.  As part of our continuing mission to provide you with exceptional heart care, we have created designated Provider Care Teams.  These Care Teams include your primary Cardiologist (physician) and Advanced Practice Providers (APPs -  Physician Assistants and Nurse Practitioners) who all work together to provide you with the care you need, when you need it.  Your next appointment:   1 year(s)  The format for your next appointment:   In Person  Provider:   You may see Fransico Him, MD or one of the following Advanced Practice Providers on your designated Care Team:    Melina Copa, PA-C  Ermalinda Barrios, PA-C  Other Instructions: You have been referred to see our Pharmacist in the [facility]

## 2020-10-14 NOTE — Addendum Note (Signed)
Addended by: Antonieta Iba on: 10/14/2020 03:58 PM   Modules accepted: Orders

## 2020-10-14 NOTE — Progress Notes (Signed)
Date:  10/14/2020   ID:  CARDEN TEEL, DOB 05-20-1951, MRN [id number]  PCP:  [name], MD  Cardiologist:   Electrophysiologist:  None   Chief Complaint:  Coronary artery calcium   History of Present Illness:    Jerry Rios is a 70 y.o. male with a history of CVA, HTN and hyperlipidemia.  He was seen for routine PE and his PCP ordered a coronary calcium score which was elevated at  684 with calcium noted in all 3 coronary arteries and LM.  He used to smoke for 10 years but quit at age 29.  His mom died of an MI at age 49 and his Dad died from cardiac arrest at 8.  He underwent coronary Ca score for risk assessment showing an elevated score of 684 and Lexiscan myoview showed no ischemia and normal LVF.    He is here today for followup and is doing well.  He denies any exertional chest pain or pressure, SOB, DOE, PND, orthopnea, dizziness (except when he moves too fast), palpitations or syncope. Occasionally he has some LE edema that is minimal.  He walks for 30 min daily and works out at [employer] and has no problems with chest discomfort.  He has noticed some discomfort in his abdomen at times and thinks it is related to his DJD and only occurs when going up stairs.  He is compliant with his meds and is tolerating meds with no SE.    Prior CV studies:   The following studies were reviewed today:  EKG  Coronary calcium CT 10/2018 IMPRESSION: Coronary calcium score of 684. This was 69 nd percentile for age and sex matched control.  Consider f/u perfusion study given extent of calcium seen  Leane Call 02/2019 Study Highlights    Nuclear stress EF: 57%.  There was no ST segment deviation noted during stress.  Defect 1: There is a small defect of mild severity present in the apex location. No ischemia.  This is a low risk study. (Known coronary calcification)    Past Medical History:  Diagnosis Date  . Abdominal pain, unspecified site 05/30/2014  . Allergic  rhinitis 04/08/2007   Qualifier: Diagnosis of  By: [name] MD, [name]   . Allergy   . Arthritis    lumbar spine  . Atherosclerosis   . BACK PAIN, LUMBAR 07/10/2008   Qualifier: Diagnosis of  By: [name] MD, [name]   . BENIGN PROSTATIC HYPERTROPHY 08/05/2007   Qualifier: Diagnosis of  By: [name] MD, [name]   . BPH (benign prostatic hypertrophy)   . Cataract    starting- mild   . Cervical radiculopathy 08/01/2017  . CKD (chronic kidney disease), stage III ([place])   . COLONIC POLYPS, HX OF 08/05/2007   Adenoma polpy per last coonscopy Sep 01, 2006, Dr Henrene Pastor   . Cough 11/12/2009   Qualifier: Diagnosis of  By: [name] MD, Arvid Right.   . Depression 04/08/2007   Qualifier: Diagnosis of  By: [name] MD, [name]   . Diverticulosis of colon   . DIVERTICULOSIS, COLON 08/05/2007   Qualifier: Diagnosis of  By: [name] MD, [place], ATOPIC 11/12/2009   Qualifier: Diagnosis of  By: [name] MD, Arvid Right.   . HEMORRHOIDS 07/10/2008   Qualifier: Diagnosis of  By: [name] MD, [name]   . History of nephrolithiasis   . History of shingles   . Hx of colonic polyp   . Hyperglycemia 11/19/2014  .  Hyperlipidemia   . Hypertension    some past history   . Insomnia   . Insomnia, unspecified 04/08/2007   Centricity Description: SYMPTOM, INSOMNIA NOS Qualifier: Diagnosis of  By: [name] MD, [name]  Centricity Description: INSOMNIA-SLEEP DISORDER-UNSPEC Qualifier: Diagnosis of  By: [name] MD, [place], LEFT 07/10/2008   Qualifier: Diagnosis of  By: [name] MD, [name]   . Libido, decreased 10/04/2011  . Loss of weight 05/30/2014  . Multinodular goiter 12/14/2016  . Nausea in adult patient 05/30/2014  . NEPHROLITHIASIS, HX OF 08/05/2007   Qualifier: Diagnosis of  By: [name] MD, [name]   . RENAL INSUFFICIENCY 09/07/2010   Qualifier: Diagnosis of  By: [name] MD, [name]   . Right leg pain 10/04/2018  . Stroke ([place]) 10/2014  . TIA (transient ischemic attack)   . Vitiligo 10/31/2012   Past Surgical History:  Procedure  Laterality Date  . CERVICAL DISC ARTHROPLASTY  12/08/2017  . COLONOSCOPY    . CYSTOSCOPY WITH URETEROSCOPY AND STENT PLACEMENT Left 07/04/2014   Procedure: CYSTOSCOPY WITH LEFT RETROGRADE PYLEOGRAM, LEFT URETERAL DILATION AND URETEROSCOPY, LASER LITHOTRISPY AND STENT PLACEMENT;  Surgeon: [name], MD;  Location: [facility];  Service: Urology;  Laterality: Left;  . HOLMIUM LASER APPLICATION N/A 13/0/8657   Procedure:  LASER LITHOTRIPSY;  Surgeon: [name], MD;  Location: [facility];  Service: Urology;  Laterality: N/A;  . lithotrypsy     x 3  . POLYPECTOMY    . TONSILLECTOMY       Current Meds  Medication Sig  . B Complex-C (SUPER B COMPLEX/VITAMIN C) TABS Take 1 tablet by mouth daily.  . Bempedoic Acid-Ezetimibe (NEXLIZET) 180-10 MG TABS Take 1 tablet by mouth daily.  . bisacodyl (DULCOLAX) 5 MG EC tablet Take 5 mg by mouth daily as needed.  . cetirizine (ZYRTEC) 10 MG tablet Take 10 mg by mouth daily as needed for allergies.  Marland Kitchen co-enzyme Q-10 30 MG capsule Take 100 mg by mouth as needed.   . docusate sodium (COLACE) 100 MG capsule Take 100 mg by mouth daily as needed for mild constipation.  . fluticasone (FLONASE) 50 MCG/ACT nasal spray USE 2 SPRAYS IN EACH  NOSTRIL DAILY  . Magnesium 500 MG CAPS Take 1 tablet by mouth daily.  . metroNIDAZOLE (METROGEL) 1 % gel APPLY TO AFFECTED AREA(S)  TOPICALLY DAILY AS NEEDED  FOR ROSACEA  . Naphazoline-Pheniramine (OPCON-A OP) Apply 1 drop to eye as needed.   . rosuvastatin (CRESTOR) 10 MG tablet Take 1 tablet (10 mg total) by mouth daily.  . Vitamin D, Cholecalciferol, 10 MCG (400 UNIT) CAPS Take 1 capsule by mouth daily.     Allergies:   Crestor [rosuvastatin], Lipitor [atorvastatin], Praluent [alirocumab], Repatha [evolocumab], Zocor [simvastatin], and Adhesive [tape]   Social History   Tobacco Use  . Smoking status: Former Smoker    Packs/day: 0.50    Years: 10.00    Pack years: 5.00  . Smokeless tobacco: Never Used  .  Tobacco comment: Regular exercise - No  Substance Use Topics  . Alcohol use: Not Currently    Alcohol/week: 7.0 standard drinks    Types: 7 Cans of beer per week  . Drug use: No     Family Hx: The patient's family history includes CAD in his father and mother; Colitis in his sister; Heart disease in an other family member; Heart failure in his father, maternal grandfather, and mother; Hyperlipidemia in an other family  member; Hypertension in an other family member; Lupus in his paternal grandfather; Ulcerative colitis in his sister. There is no history of Colon cancer, Esophageal cancer, Pancreatic cancer, Stomach cancer, Liver disease, Colon polyps, Rectal cancer, or Diabetes.  ROS:   Please see the history of present illness.     All other systems reviewed and are negative.   Labs/Other Tests and Data Reviewed:    Recent Labs: 03/23/2020: ALT 12; BUN 19; Creatinine, Ser 1.46; Potassium 4.1; Sodium 139   Recent Lipid Panel Lab Results  Component Value Date/Time   CHOL 168 03/23/2020 12:00 AM   TRIG 33 03/23/2020 12:00 AM   HDL 91 03/23/2020 12:00 AM   CHOLHDL 1.8 03/23/2020 12:00 AM   CHOLHDL 3 10/03/2019 08:42 AM   LDLCALC 69 03/23/2020 12:00 AM   LDLDIRECT 206.1 11/01/2013 09:42 AM    Wt Readings from Last 3 Encounters:  10/14/20 140 lb (63.5 kg)  10/05/20 139 lb 4 oz (63.2 kg)  08/22/20 134 lb 4.2 oz (60.9 kg)     Objective:    Vital Signs:  BP 110/60   Pulse (!) 54   Ht 5\' 7"  (1.702 m)   Wt 140 lb (63.5 kg)   SpO2 99%   BMI 21.93 kg/m    GEN: Well nourished, well developed in no acute distress HEENT: Normal NECK: No JVD; No carotid bruits LYMPHATICS: No lymphadenopathy CARDIAC:RRR, no murmurs, rubs, gallops RESPIRATORY:  Clear to auscultation without rales, wheezing or rhonchi  ABDOMEN: Soft, non-tender, non-distended MUSCULOSKELETAL:  No edema; No deformity  SKIN: Warm and dry NEUROLOGIC:  Alert and oriented x 3 PSYCHIATRIC:  Normal affect   EKG was  performed in the office today and showed sinus bradycardia at 54bpm with no ST changes  ASSESSMENT & PLAN:    1.  Coronary artery calcifications  - coronary calcium was noted in all 3 coronary arteries including left main.   -Calcium score high at > 600.   -nuclear stress test 02/2019 showed no ischemia -he denies any anginal symptoms but does have some abdominal pain when he goes up stairs but not with walking or working out at the gym -I will get an ETT to rule out ischemia  2.  Hyperlipidemia  -LDL goal is less than 70 -LDL was 69 in June 2021 -he is intolerant to PCSK9i, statins and doses > 10mg  of Crestor daily -continue Crestor 10mg  daily -he has been having problems with joint pain in his fingers, wrists toes and ankles and improved when he held the [place].   -will have him stop it for now and check a uric acid test and refer to lipid clinic  3.  Hypertension  -BP controlled on exam today -he is currently on no antihypertensive therapy  Medication Adjustments/Labs and Tests Ordered: Current medicines are reviewed at length with the patient today.  Concerns regarding medicines are outlined above.  Tests Ordered: Orders Placed This Encounter  Procedures  . EKG 12-Lead   Medication Changes: No orders of the defined types were placed in this encounter.   Disposition:  Follow up in 1 year(s)  Signed, Fransico Him, MD  10/14/2020 3:42 PM    [place]

## 2020-10-19 ENCOUNTER — Telehealth: Payer: Self-pay | Admitting: Gastroenterology

## 2020-10-19 NOTE — Telephone Encounter (Signed)
Spoke with the patient. Orders for the required kidney functions test are already ordered under basic metabolic panel (BMet). Patient requests to have these done through an outside labs. He will facilitate getting the results to [place]. Understands radiology will need the labs before the CT imaging can be done.

## 2020-10-19 NOTE — Telephone Encounter (Signed)
Pt is requesting labs ordered prior to having his CT scan.

## 2020-10-22 ENCOUNTER — Other Ambulatory Visit: Payer: Self-pay

## 2020-10-23 ENCOUNTER — Telehealth: Payer: Self-pay | Admitting: Internal Medicine

## 2020-10-23 ENCOUNTER — Encounter: Payer: Self-pay | Admitting: Internal Medicine

## 2020-10-23 ENCOUNTER — Ambulatory Visit (INDEPENDENT_AMBULATORY_CARE_PROVIDER_SITE_OTHER): Payer: 59 | Admitting: Internal Medicine

## 2020-10-23 VITALS — BP 106/62 | HR 60 | Temp 98.2°F | Ht 67.0 in | Wt 138.0 lb

## 2020-10-23 DIAGNOSIS — E7800 Pure hypercholesterolemia, unspecified: Secondary | ICD-10-CM | POA: Diagnosis not present

## 2020-10-23 DIAGNOSIS — R739 Hyperglycemia, unspecified: Secondary | ICD-10-CM | POA: Diagnosis not present

## 2020-10-23 DIAGNOSIS — I2510 Atherosclerotic heart disease of native coronary artery without angina pectoris: Secondary | ICD-10-CM

## 2020-10-23 DIAGNOSIS — F32A Depression, unspecified: Secondary | ICD-10-CM | POA: Diagnosis not present

## 2020-10-23 DIAGNOSIS — M542 Cervicalgia: Secondary | ICD-10-CM

## 2020-10-23 DIAGNOSIS — M109 Gout, unspecified: Secondary | ICD-10-CM

## 2020-10-23 DIAGNOSIS — M545 Low back pain, unspecified: Secondary | ICD-10-CM

## 2020-10-23 DIAGNOSIS — N1831 Chronic kidney disease, stage 3a: Secondary | ICD-10-CM

## 2020-10-23 NOTE — Progress Notes (Signed)
Established Patient Office Visit  Subjective:  Patient ID: Jerry Rios, male    DOB: 04/17/1951  Age: 70 y.o. MRN: [id number]      Chief Complaint: follow up neck and lbp, ckd, hld, depression, hyperglycemia       HPI:  Jerry Rios is a 70 y.o. male here with c/o > 6 mo ongoing neck and lbp midline, mild to mod constant sharp without worsening UE or LE radicular symptoms, asks for PT referral today, since has to be done at a visit due to medicare rules, though already done prior about a week ago.  No worsening balance issues or falls.  Also following closely with GI with CT ordered recently to r/o renal stone per pt, needs BMP done.  Has ongoing CKD without significant recent worsening.  Was seeing nephrology in the past, but only a few times, as did not see a benefit to continue for monitoring. Has maintained lower wt with diet, and tyring to follow lower cholesterol diet.   Pt denies polydipsia, polyuria, Pt denies chest pain, increased sob or doe, wheezing, orthopnea, PND, increased LE swelling, palpitations, dizziness or syncope.  Pt denies new neurological symptoms such as new headache, or facial or extremity weakness or numbness  Denies worsening depressive symptoms, suicidal ideation, or panic  Wt Readings from Last 3 Encounters:  10/23/20 138 lb (62.6 kg)  10/14/20 140 lb (63.5 kg)  10/05/20 139 lb 4 oz (63.2 kg)   BP Readings from Last 3 Encounters:  10/23/20 106/62  10/14/20 110/60  10/05/20 (!) 90/50   Immunization History  Administered Date(s) Administered  . Fluad Quad(high Dose 65+) 06/08/2019  . Influenza Whole 06/26/2009  . Influenza, Seasonal, Injecte, Preservative Fre 06/26/2013  . Influenza,inj,Quad PF,6+ Mos 09/27/2019  . Influenza-Unspecified 06/08/2017, 08/10/2018, 07/02/2020  . PFIZER(Purple Top)SARS-COV-2 Vaccination 12/11/2019, 12/31/2019, 07/23/2020  . Pneumococcal Conjugate-13 12/14/2016  . Pneumococcal Polysaccharide-23 10/04/2018  . Td  09/27/1995, 07/13/2009  . Tdap 10/08/2019  . Zoster 09/07/2010   There are no preventive care reminders to display for this patient.        Past Medical History:  Diagnosis Date  . Abdominal pain, unspecified site 05/30/2014  . Allergic rhinitis 04/08/2007   Qualifier: Diagnosis of  By: [name] MD, [name]   . Allergy   . Arthritis    lumbar spine  . Atherosclerosis   . BACK PAIN, LUMBAR 07/10/2008   Qualifier: Diagnosis of  By: [name] MD, [name]   . BENIGN PROSTATIC HYPERTROPHY 08/05/2007   Qualifier: Diagnosis of  By: [name] MD, [name]   . BPH (benign prostatic hypertrophy)   . Cataract    starting- mild   . Cervical radiculopathy 08/01/2017  . CKD (chronic kidney disease), stage III ([place])   . COLONIC POLYPS, HX OF 08/05/2007   Adenoma polpy per last coonscopy Sep 01, 2006, Dr Henrene Pastor   . Cough 11/12/2009   Qualifier: Diagnosis of  By: [name] MD, Arvid Right.   . Depression 04/08/2007   Qualifier: Diagnosis of  By: [name] MD, [name]   . Diverticulosis of colon   . DIVERTICULOSIS, COLON 08/05/2007   Qualifier: Diagnosis of  By: [name] MD, [place], ATOPIC 11/12/2009   Qualifier: Diagnosis of  By: [name] MD, Arvid Right.   . HEMORRHOIDS 07/10/2008   Qualifier: Diagnosis of  By: [name] MD, [name]   . History of nephrolithiasis   . History of shingles   . Hx of colonic polyp   .  Hyperglycemia 11/19/2014  . Hyperlipidemia   . Hypertension    some past history   . Insomnia   . Insomnia, unspecified 04/08/2007   Centricity Description: SYMPTOM, INSOMNIA NOS Qualifier: Diagnosis of  By: [name] MD, [name]  Centricity Description: INSOMNIA-SLEEP DISORDER-UNSPEC Qualifier: Diagnosis of  By: [name] MD, [place], LEFT 07/10/2008   Qualifier: Diagnosis of  By: [name] MD, [name]   . Libido, decreased 10/04/2011  . Loss of weight 05/30/2014  . Multinodular goiter 12/14/2016  . Nausea in adult patient 05/30/2014  . NEPHROLITHIASIS, HX OF 08/05/2007   Qualifier: Diagnosis of  By: [name] MD, [name]    . RENAL INSUFFICIENCY 09/07/2010   Qualifier: Diagnosis of  By: [name] MD, [name]   . Right leg pain 10/04/2018  . Stroke ([place]) 10/2014  . TIA (transient ischemic attack)   . Vitiligo 10/31/2012   Past Surgical History:  Procedure Laterality Date  . CERVICAL DISC ARTHROPLASTY  12/08/2017  . COLONOSCOPY    . CYSTOSCOPY WITH URETEROSCOPY AND STENT PLACEMENT Left 07/04/2014   Procedure: CYSTOSCOPY WITH LEFT RETROGRADE PYLEOGRAM, LEFT URETERAL DILATION AND URETEROSCOPY, LASER LITHOTRISPY AND STENT PLACEMENT;  Surgeon: [name], MD;  Location: [facility];  Service: Urology;  Laterality: Left;  . HOLMIUM LASER APPLICATION N/A [id number]   Procedure:  LASER LITHOTRIPSY;  Surgeon: [name], MD;  Location: [facility];  Service: Urology;  Laterality: N/A;  . lithotrypsy     x 3  . POLYPECTOMY    . TONSILLECTOMY      reports that he has quit smoking. He has a 5.00 pack-year smoking history. He has never used smokeless tobacco. He reports previous alcohol use of about 7.0 standard drinks of alcohol per week. He reports that he does not use drugs. family history includes CAD in his father and mother; Colitis in his sister; Heart disease in an other family member; Heart failure in his father, maternal grandfather, and mother; Hyperlipidemia in an other family member; Hypertension in an other family member; Lupus in his paternal grandfather; Ulcerative colitis in his sister. Allergies  Allergen Reactions  . Crestor [Rosuvastatin]     REACTION: myalgia  . Lipitor [Atorvastatin]     REACTION: myalgia  . Praluent [Alirocumab]     ineffective  . Repatha [Evolocumab]     ineffective  . Zocor [Simvastatin]     REACTION: myalgia  . Adhesive [Tape] Rash   Current Outpatient Medications on File Prior to Visit  Medication Sig Dispense Refill  . B Complex-C (SUPER B COMPLEX/VITAMIN C) TABS Take 1 tablet by mouth daily.    . bisacodyl (DULCOLAX) 5 MG EC tablet Take 5 mg by mouth daily as needed.     . cetirizine (ZYRTEC) 10 MG tablet Take 10 mg by mouth daily as needed for allergies.    Marland Kitchen co-enzyme Q-10 30 MG capsule Take 100 mg by mouth as needed.     . docusate sodium (COLACE) 100 MG capsule Take 100 mg by mouth daily as needed for mild constipation.    . fluticasone (FLONASE) 50 MCG/ACT nasal spray USE 2 SPRAYS IN EACH  NOSTRIL DAILY 48 g 1  . Magnesium 500 MG CAPS Take 1 tablet by mouth daily.    . metroNIDAZOLE (METROGEL) 1 % gel APPLY TO AFFECTED AREA(S)  TOPICALLY DAILY AS NEEDED  FOR ROSACEA 120 g 1  . Naphazoline-Pheniramine (OPCON-A OP) Apply 1 drop to eye as needed.     Marland Kitchen  rosuvastatin (CRESTOR) 10 MG tablet Take 1 tablet (10 mg total) by mouth daily. 90 tablet 0  . Vitamin D, Cholecalciferol, 10 MCG (400 UNIT) CAPS Take 1 capsule by mouth daily.    Marland Kitchen OVER THE COUNTER MEDICATION Trace mineral supplement daily- 10 drops     No current facility-administered medications on file prior to visit.        ROS:  All others reviewed and negative.  Objective        PE:  BP 106/62   Pulse 60   Temp 98.2 F (36.8 C) (Oral)   Ht 5\' 7"  (1.702 m)   Wt 138 lb (62.6 kg)   SpO2 97%   BMI 21.61 kg/m                 Constitutional: Pt appears in NAD               HENT: Head: NCAT.                Right Ear: External ear normal.                 Left Ear: External ear normal.                Eyes: . Pupils are equal, round, and reactive to light. Conjunctivae and EOM are normal               Nose: without d/c or deformity               Neck: Neck supple. Gross normal ROM               Cardiovascular: Normal rate and regular rhythm.                 Pulmonary/Chest: Effort normal and breath sounds without rales or wheezing.                Abd:  Soft, NT, ND, + BS, no organomegaly               Neurological: Pt is alert. At baseline orientation, motor grossly intact               Skin: Skin is warm. No rashes, no other new lesions, LE edema - trace LLE only               Psychiatric: Pt  behavior is normal without agitation   Assessment/Plan:  TORIAN QUINTERO is a 70 y.o. White or Caucasian [1] male with  has a past medical history of Abdominal pain, unspecified site (05/30/2014), Allergic rhinitis (04/08/2007), Allergy, Arthritis, Atherosclerosis, BACK PAIN, LUMBAR (07/10/2008), BENIGN PROSTATIC HYPERTROPHY (08/05/2007), BPH (benign prostatic hypertrophy), Cataract, Cervical radiculopathy (08/01/2017), CKD (chronic kidney disease), stage III ([place]), COLONIC POLYPS, HX OF (08/05/2007), Cough (11/12/2009), Depression (04/08/2007), Diverticulosis of colon, DIVERTICULOSIS, COLON (08/05/2007), ECZEMA, ATOPIC (11/12/2009), HEMORRHOIDS (07/10/2008), History of nephrolithiasis, History of shingles, colonic polyp, Hyperglycemia (11/19/2014), Hyperlipidemia, Hypertension, Insomnia, Insomnia, unspecified (04/08/2007), KNEE PAIN, LEFT (07/10/2008), Libido, decreased (10/04/2011), Loss of weight (05/30/2014), Multinodular goiter (12/14/2016), Nausea in adult patient (05/30/2014), NEPHROLITHIASIS, HX OF (08/05/2007), RENAL INSUFFICIENCY (09/07/2010), Right leg pain (10/04/2018), Stroke ([place]) (10/2014), TIA (transient ischemic attack), and Vitiligo (10/31/2012).   Micro: none  Cardiac tracings I have personally interpreted today:  none  Pertinent Radiological findings (summarize): none   Lab Results  Component Value Date   WBC 5.5 10/03/2019   HGB 13.2 10/03/2019   HCT 38.9 (L) 10/03/2019   PLT 196.0 10/03/2019  GLUCOSE 102 (H) 03/23/2020   CHOL 168 03/23/2020   TRIG 33 03/23/2020   HDL 91 03/23/2020   LDLDIRECT 206.1 11/01/2013   LDLCALC 69 03/23/2020   ALT 12 03/23/2020   AST 22 03/23/2020   NA 139 03/23/2020   K 4.1 03/23/2020   CL 101 03/23/2020   CREATININE 1.46 (H) 03/23/2020   BUN 19 03/23/2020   CO2 26 03/23/2020   TSH 1.52 10/03/2019   PSA 1.66 10/03/2019   HGBA1C 5.3 11/13/2019     Assessment & Plan:   Problem List Items Addressed This Visit      Medium   Neck pain    Chronic stable  persistent, also for PT as requested      Relevant Orders   Ambulatory referral to Physical Therapy   Hyperlipidemia    Lab Results  Component Value Date   LDLCALC 69 03/23/2020   Stable, pt to continue current statin crestor   Current Outpatient Medications (Cardiovascular):  .  rosuvastatin (CRESTOR) 10 MG tablet, Take 1 tablet (10 mg total) by mouth daily.  Current Outpatient Medications (Respiratory):  .  cetirizine (ZYRTEC) 10 MG tablet, Take 10 mg by mouth daily as needed for allergies. .  fluticasone (FLONASE) 50 MCG/ACT nasal spray, USE 2 SPRAYS IN EACH  NOSTRIL DAILY    Current Outpatient Medications (Other):  Marland Kitchen  B Complex-C (SUPER B COMPLEX/VITAMIN C) TABS, Take 1 tablet by mouth daily. .  bisacodyl (DULCOLAX) 5 MG EC tablet, Take 5 mg by mouth daily as needed. Marland Kitchen  co-enzyme Q-10 30 MG capsule, Take 100 mg by mouth as needed.  .  docusate sodium (COLACE) 100 MG capsule, Take 100 mg by mouth daily as needed for mild constipation. .  Magnesium 500 MG CAPS, Take 1 tablet by mouth daily. .  metroNIDAZOLE (METROGEL) 1 % gel, APPLY TO AFFECTED AREA(S)  TOPICALLY DAILY AS NEEDED  FOR ROSACEA .  Naphazoline-Pheniramine (OPCON-A OP), Apply 1 drop to eye as needed.  .  Vitamin D, Cholecalciferol, 10 MCG (400 UNIT) CAPS, Take 1 capsule by mouth daily. Marland Kitchen  OVER THE COUNTER MEDICATION, Trace mineral supplement daily- 10 drops       Relevant Orders   Lipid panel   Hyperglycemia    Lab Results  Component Value Date   HGBA1C 5.3 11/13/2019   Stable, pt to continue current medical treatment - diet       Relevant Orders   Hemoglobin A1c   Depression    Stable, not current medication rx needed       CKD (chronic kidney disease) stage 3, GFR 30-59 ml/min (HCC) - Primary    Likely stable, for f/u lab today, consider repeat renal referral if worsening, declines for no, plan to f/u every 6 mo      Relevant Orders   Lipid panel   Hepatic function panel   CBC with  Differential/Platelet   Basic metabolic panel   Phosphorus   PTH, intact and calcium   VITAMIN D 25 Hydroxy (Vit-D Deficiency, Fractures)     Low   BACK PAIN, LUMBAR    With neck pain, ongoing persistent mild to mod, for referral PT as per request      Relevant Orders   Ambulatory referral to Physical Therapy      No orders of the defined types were placed in this encounter.   Follow-up: Return in about 6 months (around 04/22/2021).   Cathlean Cower, MD 10/23/2020 10:27 PM [place]  [facility]

## 2020-10-23 NOTE — Patient Instructions (Signed)
You will be contacted regarding the referral for: Physical Therapy  Please continue all other medications as before, and refills have been done if requested.  Please have the pharmacy call with any other refills you may need.  Please continue your efforts at being more active, low cholesterol diet, and weight control.  You are otherwise up to date with prevention measures today.  Please keep your appointments with your specialists as you may have planned - the CT scan for GI  Please go to the LAB at the blood drawing area for the tests to be done  You will be contacted by phone if any changes need to be made immediately.  Otherwise, you will receive a letter about your results with an explanation, but please check with MyChart first.  Please remember to sign up for MyChart if you have not done so, as this will be important to you in the future with finding out test results, communicating by private email, and scheduling acute appointments online when needed.  Please make an Appointment to return in 6 months, or sooner if needed

## 2020-10-23 NOTE — Telephone Encounter (Signed)
Patient came down to do labs but decided to wait and come back when he was fasting next week. He has another order from Dr Fransico Him for a Uric Acid and asked if you would be willing to order that so he could have them all done here?  Please advise.

## 2020-10-23 NOTE — Telephone Encounter (Signed)
Ok.  I have ordered

## 2020-10-24 ENCOUNTER — Encounter: Payer: Self-pay | Admitting: Internal Medicine

## 2020-10-24 DIAGNOSIS — M542 Cervicalgia: Secondary | ICD-10-CM | POA: Insufficient documentation

## 2020-10-24 NOTE — Assessment & Plan Note (Signed)
Stable, not current medication rx needed

## 2020-10-24 NOTE — Assessment & Plan Note (Signed)
Likely stable, for f/u lab today, consider repeat renal referral if worsening, declines for no, plan to f/u every 6 mo

## 2020-10-24 NOTE — Assessment & Plan Note (Signed)
Lab Results  Component Value Date   HGBA1C 5.3 11/13/2019   Stable, pt to continue current medical treatment - diet

## 2020-10-24 NOTE — Assessment & Plan Note (Signed)
Lab Results  Component Value Date   LDLCALC 69 03/23/2020   Stable, pt to continue current statin crestor   Current Outpatient Medications (Cardiovascular):  .  rosuvastatin (CRESTOR) 10 MG tablet, Take 1 tablet (10 mg total) by mouth daily.  Current Outpatient Medications (Respiratory):  .  cetirizine (ZYRTEC) 10 MG tablet, Take 10 mg by mouth daily as needed for allergies. .  fluticasone (FLONASE) 50 MCG/ACT nasal spray, USE 2 SPRAYS IN EACH  NOSTRIL DAILY    Current Outpatient Medications (Other):  Marland Kitchen  B Complex-C (SUPER B COMPLEX/VITAMIN C) TABS, Take 1 tablet by mouth daily. .  bisacodyl (DULCOLAX) 5 MG EC tablet, Take 5 mg by mouth daily as needed. Marland Kitchen  co-enzyme Q-10 30 MG capsule, Take 100 mg by mouth as needed.  .  docusate sodium (COLACE) 100 MG capsule, Take 100 mg by mouth daily as needed for mild constipation. .  Magnesium 500 MG CAPS, Take 1 tablet by mouth daily. .  metroNIDAZOLE (METROGEL) 1 % gel, APPLY TO AFFECTED AREA(S)  TOPICALLY DAILY AS NEEDED  FOR ROSACEA .  Naphazoline-Pheniramine (OPCON-A OP), Apply 1 drop to eye as needed.  .  Vitamin D, Cholecalciferol, 10 MCG (400 UNIT) CAPS, Take 1 capsule by mouth daily. Marland Kitchen  OVER THE COUNTER MEDICATION, Trace mineral supplement daily- 10 drops

## 2020-10-24 NOTE — Assessment & Plan Note (Signed)
With neck pain, ongoing persistent mild to mod, for referral PT as per request

## 2020-10-24 NOTE — Assessment & Plan Note (Signed)
Chronic stable persistent, also for PT as requested

## 2020-10-26 ENCOUNTER — Encounter: Payer: Self-pay | Admitting: Gastroenterology

## 2020-10-26 NOTE — Telephone Encounter (Signed)
Pt informed

## 2020-10-27 ENCOUNTER — Inpatient Hospital Stay (HOSPITAL_COMMUNITY): Admission: RE | Admit: 2020-10-27 | Payer: Medicare Other | Source: Ambulatory Visit

## 2020-10-27 ENCOUNTER — Other Ambulatory Visit (INDEPENDENT_AMBULATORY_CARE_PROVIDER_SITE_OTHER): Payer: 59

## 2020-10-27 DIAGNOSIS — N1831 Chronic kidney disease, stage 3a: Secondary | ICD-10-CM

## 2020-10-27 DIAGNOSIS — E7800 Pure hypercholesterolemia, unspecified: Secondary | ICD-10-CM | POA: Diagnosis not present

## 2020-10-27 DIAGNOSIS — M109 Gout, unspecified: Secondary | ICD-10-CM | POA: Diagnosis not present

## 2020-10-27 DIAGNOSIS — R739 Hyperglycemia, unspecified: Secondary | ICD-10-CM | POA: Diagnosis not present

## 2020-10-27 LAB — HEPATIC FUNCTION PANEL
ALT: 26 U/L (ref 0–53)
AST: 29 U/L (ref 0–37)
Albumin: 4.5 g/dL (ref 3.5–5.2)
Alkaline Phosphatase: 23 U/L — ABNORMAL LOW (ref 39–117)
Bilirubin, Direct: 0.1 mg/dL (ref 0.0–0.3)
Total Bilirubin: 0.6 mg/dL (ref 0.2–1.2)
Total Protein: 6.9 g/dL (ref 6.0–8.3)

## 2020-10-27 LAB — CBC WITH DIFFERENTIAL/PLATELET
Basophils Absolute: 0 10*3/uL (ref 0.0–0.1)
Basophils Relative: 0.7 % (ref 0.0–3.0)
Eosinophils Absolute: 0.1 10*3/uL (ref 0.0–0.7)
Eosinophils Relative: 2.6 % (ref 0.0–5.0)
HCT: 38 % — ABNORMAL LOW (ref 39.0–52.0)
Hemoglobin: 13.1 g/dL (ref 13.0–17.0)
Lymphocytes Relative: 35.3 % (ref 12.0–46.0)
Lymphs Abs: 1.7 10*3/uL (ref 0.7–4.0)
MCHC: 34.4 g/dL (ref 30.0–36.0)
MCV: 90.7 fl (ref 78.0–100.0)
Monocytes Absolute: 0.5 10*3/uL (ref 0.1–1.0)
Monocytes Relative: 10.1 % (ref 3.0–12.0)
Neutro Abs: 2.5 10*3/uL (ref 1.4–7.7)
Neutrophils Relative %: 51.3 % (ref 43.0–77.0)
Platelets: 211 10*3/uL (ref 150.0–400.0)
RBC: 4.19 Mil/uL — ABNORMAL LOW (ref 4.22–5.81)
RDW: 13.6 % (ref 11.5–15.5)
WBC: 4.8 10*3/uL (ref 4.0–10.5)

## 2020-10-27 LAB — BASIC METABOLIC PANEL
BUN: 23 mg/dL (ref 6–23)
CO2: 28 mEq/L (ref 19–32)
Calcium: 10 mg/dL (ref 8.4–10.5)
Chloride: 100 mEq/L (ref 96–112)
Creatinine, Ser: 1.46 mg/dL (ref 0.40–1.50)
GFR: 48.54 mL/min — ABNORMAL LOW (ref >60.00)
Glucose, Bld: 88 mg/dL (ref 70–99)
Potassium: 3.9 mEq/L (ref 3.5–5.1)
Sodium: 138 mEq/L (ref 135–145)

## 2020-10-27 LAB — LIPID PANEL
Cholesterol: 174 mg/dL (ref 0–200)
HDL: 98 mg/dL (ref >39.00)
LDL Cholesterol: 68 mg/dL (ref 0–99)
NonHDL: 75.73
Total CHOL/HDL Ratio: 2
Triglycerides: 38 mg/dL (ref 0.0–149.0)
VLDL: 7.6 mg/dL (ref 0.0–40.0)

## 2020-10-27 LAB — VITAMIN D 25 HYDROXY (VIT D DEFICIENCY, FRACTURES): VITD: 47.52 ng/mL (ref 30.00–100.00)

## 2020-10-27 LAB — URIC ACID: Uric Acid, Serum: 7 mg/dL (ref 4.0–7.8)

## 2020-10-27 LAB — PHOSPHORUS: Phosphorus: 3 mg/dL (ref 2.3–4.6)

## 2020-10-27 LAB — HEMOGLOBIN A1C: Hgb A1c MFr Bld: 5.7 % (ref 4.6–6.5)

## 2020-10-29 ENCOUNTER — Other Ambulatory Visit: Payer: Self-pay

## 2020-10-29 ENCOUNTER — Ambulatory Visit (INDEPENDENT_AMBULATORY_CARE_PROVIDER_SITE_OTHER): Payer: 59 | Admitting: Pharmacist

## 2020-10-29 DIAGNOSIS — I2510 Atherosclerotic heart disease of native coronary artery without angina pectoris: Secondary | ICD-10-CM

## 2020-10-29 DIAGNOSIS — E7800 Pure hypercholesterolemia, unspecified: Secondary | ICD-10-CM | POA: Diagnosis not present

## 2020-10-29 LAB — PTH, INTACT AND CALCIUM
Calcium: 9.8 mg/dL (ref 8.6–10.3)
PTH: 32 pg/mL (ref 14–64)

## 2020-10-29 NOTE — Patient Instructions (Addendum)
  It was nice meeting you today!  Expect a call from one of our research nurses regarding the clinical trials  If you are not interested or do not qualify, and may want to start Leqvio, please give [place] a call  Continue your rosuvastatin 10mg  once daily  Continue a heart healthy, low carbohydrate diet  Continue your exercise plan of at least 30 minutes a day at least 5 days a week   Karren Cobble, PharmD, BCACP, CDCES, [place] [address], [place], [place] [address] Phone: [phone]; Fax: [phone] 10/29/2020 3:22 PM

## 2020-10-29 NOTE — Progress Notes (Signed)
Patient ID: Jerry Rios                 DOB: 06-17-1951                    MRN: [id number]     HPI: Jerry Rios is a 70 y.o. male patient referred to lipid clinic by Dr Radford Pax. PMH is significant for CVA, HTN and hyperlipidemia.  He was seen for routine PE and his PCP ordered a coronary calcium score which was elevated at  684 with calcium noted in all 3 coronary arteries and LM.  He used to smoke for 10 years but quit at age 59.  His mom died of an MI at age 67 and his Dad died from cardiac arrest at 34.  He underwent coronary Ca score for risk assessment showing an elevated score of 684 and Lexiscan myoview showed no ischemia and normal LVF.    Patient is here for lipid follow up.  Has tried and failed both Praluent and Repatha. LDL increased on both despite patient compliance.  Has trialed multiple statins including pitavastain, pravastatin, red yeast rice, and rosuvastatin.  All caused severe myalgia.  Was able to reduce rosuvastatin to 10 mg and can toelrate but can not go any higher.  In last year, bempedoic acid was added on to assist in further LDL lowering and was effective.  However, had also started causing myalgia, especially in feet, legs, and hands.  Was not able to button his shirt due to pain.  At last visit with Dr Radford Pax, bempedoic acid was discontinued and patient was referred to lipid clinic.  Patient presents today in good spirits.  Is pleased with LDL reaching goal level but side effects to lipid lowering therapies have ihad impact on his quality of life.  Follow a heart healthy diet including vegetables and whole grains.  Is physcially active on a near daily basis.  Has a membership at [employer] which he goes to once a week and has a park near his home where he goes on 1+ mile walks when weather is nice.  Is motivated to control his cholesterol.  Current Medications: rosuvastatin 10mg  Intolerances: Praluent 75mg , Repatha 140mg , Nexletol, pitavastatin, pravastatin,  simvastatin, atorvastatin Risk Factors: HTN, HLD, CVA, elevated coronary calcium score LDL goal: <70  Exercise: Goes to [facility] once a week.  Walks for 30 minutes on trail if weather is nicer  Family History: Mother: High cholesterol, paternal grandmother  Social History: Has not had alcohol in 12 months (January 2021)  Labs: LDL 68, Trigs 38, HDL 98, TC 174   Past Medical History:  Diagnosis Date  . Abdominal pain, unspecified site 05/30/2014  . Allergic rhinitis 04/08/2007   Qualifier: Diagnosis of  By: [name] MD, [name]   . Allergy   . Arthritis    lumbar spine  . Atherosclerosis   . BACK PAIN, LUMBAR 07/10/2008   Qualifier: Diagnosis of  By: [name] MD, [name]   . BENIGN PROSTATIC HYPERTROPHY 08/05/2007   Qualifier: Diagnosis of  By: [name] MD, [name]   . BPH (benign prostatic hypertrophy)   . Cataract    starting- mild   . Cervical radiculopathy 08/01/2017  . CKD (chronic kidney disease), stage III ([place])   . COLONIC POLYPS, HX OF 08/05/2007   Adenoma polpy per last coonscopy Sep 01, 2006, Dr Henrene Pastor   . Cough 11/12/2009   Qualifier: Diagnosis of  By: [name] MD, Arvid Right.   Marland Kitchen  Depression 04/08/2007   Qualifier: Diagnosis of  By: [name] MD, [name]   . Diverticulosis of colon   . DIVERTICULOSIS, COLON 08/05/2007   Qualifier: Diagnosis of  By: [name] MD, [place], ATOPIC 11/12/2009   Qualifier: Diagnosis of  By: [name] MD, Arvid Right.   . HEMORRHOIDS 07/10/2008   Qualifier: Diagnosis of  By: [name] MD, [name]   . History of nephrolithiasis   . History of shingles   . Hx of colonic polyp   . Hyperglycemia 11/19/2014  . Hyperlipidemia   . Hypertension    some past history   . Insomnia   . Insomnia, unspecified 04/08/2007   Centricity Description: SYMPTOM, INSOMNIA NOS Qualifier: Diagnosis of  By: [name] MD, [name]  Centricity Description: INSOMNIA-SLEEP DISORDER-UNSPEC Qualifier: Diagnosis of  By: [name] MD, [place], LEFT 07/10/2008   Qualifier: Diagnosis of  By: [name] MD,  [name]   . Libido, decreased 10/04/2011  . Loss of weight 05/30/2014  . Multinodular goiter 12/14/2016  . Nausea in adult patient 05/30/2014  . NEPHROLITHIASIS, HX OF 08/05/2007   Qualifier: Diagnosis of  By: [name] MD, [name]   . RENAL INSUFFICIENCY 09/07/2010   Qualifier: Diagnosis of  By: [name] MD, [name]   . Right leg pain 10/04/2018  . Stroke ([place]) 10/2014  . TIA (transient ischemic attack)   . Vitiligo 10/31/2012    Current Outpatient Medications on File Prior to Visit  Medication Sig Dispense Refill  . B Complex-C (SUPER B COMPLEX/VITAMIN C) TABS Take 1 tablet by mouth daily.    . bisacodyl (DULCOLAX) 5 MG EC tablet Take 5 mg by mouth daily as needed.    . cetirizine (ZYRTEC) 10 MG tablet Take 10 mg by mouth daily as needed for allergies.    Marland Kitchen co-enzyme Q-10 30 MG capsule Take 100 mg by mouth as needed.     . docusate sodium (COLACE) 100 MG capsule Take 100 mg by mouth daily as needed for mild constipation.    . fluticasone (FLONASE) 50 MCG/ACT nasal spray USE 2 SPRAYS IN EACH  NOSTRIL DAILY 48 g 1  . metroNIDAZOLE (METROGEL) 1 % gel APPLY TO AFFECTED AREA(S)  TOPICALLY DAILY AS NEEDED  FOR ROSACEA 120 g 1  . Naphazoline-Pheniramine (OPCON-A OP) Apply 1 drop to eye as needed.     . rosuvastatin (CRESTOR) 10 MG tablet Take 1 tablet (10 mg total) by mouth daily. 90 tablet 0  . Vitamin D, Cholecalciferol, 10 MCG (400 UNIT) CAPS Take 1 capsule by mouth daily.     No current facility-administered medications on file prior to visit.    Allergies  Allergen Reactions  . Crestor [Rosuvastatin]     REACTION: myalgia  . Lipitor [Atorvastatin]     REACTION: myalgia  . Praluent [Alirocumab]     ineffective  . Repatha [Evolocumab]     ineffective  . Zocor [Simvastatin]     REACTION: myalgia  . Adhesive [Tape] Rash    Assessment/Plan:  1. Hyperlipidemia - Most recent LDL 68 which is at goal of <70.  However this when patient was taking rosuvastatin 10mg  and Nexletol.  Has since d/c due to  muscle and joint pain and likely LDL will increase as it has in past.  Changing statins not an option and increasing rosuvastatin to doses greater than 10mg  intolerable to patient.  Options for controlling LDL are small.  Next steps for patient would be to  consider Leqvio or perhaps enroll in clinical trials.  Discussed Leqvio with patient including dosing schedule and how it is administered.  Patient concerned regarding cost and unfortunately could not answer his questions satisfactorily since it is billed though Medicare.  Discussed possibility of enrolling in clinical trials and patient was interested.  Reached out to clinical research nursing staff regarding possible enrollment in trials and that patient was interested.  Possible he may qualify due to multiple statin intolerances and failures of previous therapies.  Research nurses responded they will reach out to patient.  Patient voiced understanding and satisfaction.  Recheck as needed.  Continue rosuvastatin 10mg  daily Continue heart healthy diet Continue weekly exercise regimen  Karren Cobble, PharmD, BCACP, CDCES, [place] [id number] [place], [place] [address] Phone: [phone]; Fax: [phone] 10/30/2020 7:37 AM

## 2020-11-06 ENCOUNTER — Other Ambulatory Visit: Payer: Self-pay

## 2020-11-06 ENCOUNTER — Ambulatory Visit (AMBULATORY_SURGERY_CENTER): Payer: 59 | Admitting: Gastroenterology

## 2020-11-06 ENCOUNTER — Encounter: Payer: Self-pay | Admitting: Gastroenterology

## 2020-11-06 VITALS — BP 102/54 | HR 43 | Temp 97.1°F | Resp 13 | Ht 67.0 in | Wt 139.0 lb

## 2020-11-06 DIAGNOSIS — R1013 Epigastric pain: Secondary | ICD-10-CM

## 2020-11-06 DIAGNOSIS — K2970 Gastritis, unspecified, without bleeding: Secondary | ICD-10-CM | POA: Diagnosis present

## 2020-11-06 DIAGNOSIS — K219 Gastro-esophageal reflux disease without esophagitis: Secondary | ICD-10-CM

## 2020-11-06 MED ORDER — SODIUM CHLORIDE 0.9 % IV SOLN: 500.0000 mL | Freq: Once | INTRAVENOUS | Status: DC

## 2020-11-06 NOTE — Patient Instructions (Signed)
HANDOUTS PROVIDED ON: GASTRITIS  The biopsies taken today have been sent for pathology.  The results can take 1-3 weeks to receive.    You may resume your previous diet and medication schedule.  Thank you for allowing [place] to care for you today!!!   YOU HAD AN ENDOSCOPIC PROCEDURE TODAY AT [facility]:   Refer to the procedure report that was given to you for any specific questions about what was found during the examination.  If the procedure report does not answer your questions, please call your gastroenterologist to clarify.  If you requested that your care partner not be given the details of your procedure findings, then the procedure report has been included in a sealed envelope for you to review at your convenience later.  YOU SHOULD EXPECT: Some feelings of bloating in the abdomen. Passage of more gas than usual.  Walking can help get rid of the air that was put into your GI tract during the procedure and reduce the bloating.   Please Note:  You might notice some irritation and congestion in your nose or some drainage.  This is from the oxygen used during your procedure.  There is no need for concern and it should clear up in a day or so.  SYMPTOMS TO REPORT IMMEDIATELY:   Following upper endoscopy (EGD)  Vomiting of blood or coffee ground material  New chest pain or pain under the shoulder blades  Painful or persistently difficult swallowing  New shortness of breath  Fever of 100F or higher  Black, tarry-looking stools  For urgent or emergent issues, a gastroenterologist can be reached at any hour by calling [phone]. Do not use MyChart messaging for urgent concerns.    DIET:  We do recommend a small meal at first, but then you may proceed to your regular diet.  Drink plenty of fluids but you should avoid alcoholic beverages for 24 hours.  ACTIVITY:  You should plan to take it easy for the rest of today and you should NOT DRIVE or use heavy machinery  until tomorrow (because of the sedation medicines used during the test).    FOLLOW UP: Our staff will call the number listed on your records Tuesday morning between 7:15 am and 8:15 am to check on you and address any questions or concerns that you may have regarding the information given to you following your procedure. If we do not reach you, we will leave a message.  We will attempt to reach you two times.  During this call, we will ask if you have developed any symptoms of COVID 19. If you develop any symptoms (ie: fever, flu-like symptoms, shortness of breath, cough etc.) before then, please call [phone].  If you test positive for Covid 19 in the 2 weeks post procedure, please call and report this information to [place].    If any biopsies were taken you will be contacted by phone or by letter within the next 1-3 weeks.  Please call us at [phone] if you have not heard about the biopsies in 3 weeks.    SIGNATURES/CONFIDENTIALITY: You and/or your care partner have signed paperwork which will be entered into your electronic medical record.  These signatures attest to the fact that that the information above on your After Visit Summary has been reviewed and is understood.  Full responsibility of the confidentiality of this discharge information lies with you and/or your care-partner.

## 2020-11-06 NOTE — Progress Notes (Signed)
A/ox3, pleased with MAC, report to RN 

## 2020-11-06 NOTE — Progress Notes (Signed)
Called to room to assist during endoscopic procedure.  Patient ID and intended procedure confirmed with present staff. Received instructions for my participation in the procedure from the performing physician.  

## 2020-11-06 NOTE — Op Note (Signed)
[facility] Patient Name: Jerry Rios Procedure Date: 11/06/2020 11:23 AM MRN: [id number] Endoscopist: Mauri Pole , MD Age: 70 Referring MD:  Date of Birth: 1950/11/01 Gender: Male Account #: [id number] Procedure:                Upper GI endoscopy Indications:              Epigastric abdominal pain, Abdominal bloating,                            Nausea Medicines:                Monitored Anesthesia Care Procedure:                Pre-Anesthesia Assessment:                           - Prior to the procedure, a History and Physical                            was performed, and patient medications and                            allergies were reviewed. The patient's tolerance of                            previous anesthesia was also reviewed. The risks                            and benefits of the procedure and the sedation                            options and risks were discussed with the patient.                            All questions were answered, and informed consent                            was obtained. Prior Anticoagulants: The patient has                            taken no previous anticoagulant or antiplatelet                            agents. ASA Grade Assessment: II - A patient with                            mild systemic disease. After reviewing the risks                            and benefits, the patient was deemed in                            satisfactory condition to undergo the procedure.  After obtaining informed consent, the endoscope was                            passed under direct vision. Throughout the                            procedure, the patient's blood pressure, pulse, and                            oxygen saturations were monitored continuously. The                            Endoscope was introduced through the mouth, and                            advanced to the second part of duodenum. The  upper                            GI endoscopy was accomplished without difficulty.                            The patient tolerated the procedure well. Scope In: Scope Out: Findings:                 The examined esophagus was normal.                           The gastroesophageal flap valve was visualized                            endoscopically and classified as Hill Grade II                            (fold present, opens with respiration).                           Scattered mild inflammation characterized by                            congestion (edema), erosions and erythema was found                            in the entire examined stomach. Biopsies were taken                            with a cold forceps for histology. Biopsies were                            taken with a cold forceps for Helicobacter pylori                            testing.                           The first portion of the duodenum and second  portion of the duodenum were normal. Biopsies were                            taken with a cold forceps for histology. Complications:            No immediate complications. Estimated Blood Loss:     Estimated blood loss was minimal. Impression:               - Normal esophagus.                           - Gastroesophageal flap valve classified as Hill                            Grade II (fold present, opens with respiration).                           - Gastritis. Biopsied.                           - Normal first portion of the duodenum and second                            portion of the duodenum. Biopsied. Recommendation:           - Resume previous diet.                           - Continue present medications.                           - Await pathology results.                           - Schedule abominal ultrasound for abdominal pain                            and bloating, next available appointment.                           -  Return to GI office at the next available                            appointment in 6-8 weeks. Mauri Pole, MD 11/06/2020 11:41:42 AM This report has been signed electronically.

## 2020-11-10 ENCOUNTER — Telehealth: Payer: Self-pay | Admitting: *Deleted

## 2020-11-10 ENCOUNTER — Telehealth: Payer: Self-pay

## 2020-11-10 NOTE — Telephone Encounter (Signed)
  Follow up Call-  Call back number 11/06/2020  Post procedure Call Back phone  # [phone]  Permission to leave phone message Yes  Some recent data might be hidden     Patient questions:  Message left to call us if necessary.

## 2020-11-10 NOTE — Telephone Encounter (Signed)
1. Have you developed a fever since your procedure? no  2.   Have you had an respiratory symptoms (SOB or cough) since your procedure? no  3.   Have you tested positive for COVID 19 since your procedure no  4.   Have you had any family members/close contacts diagnosed with the COVID 19 since your procedure?  no   If yes to any of these questions please route to Joylene John, RN and Joella Prince, RN Follow up Call-  Call back number 11/06/2020  Post procedure Call Back phone  # [phone]  Permission to leave phone message Yes  Some recent data might be hidden     Patient questions:  Do you have a fever, pain , or abdominal swelling? No. Pain Score  0 *  Have you tolerated food without any problems? Yes.    Have you been able to return to your normal activities? Yes.    Do you have any questions about your discharge instructions: Diet   No. Medications  No. Follow up visit  No.  Do you have questions or concerns about your Care? No.  Actions: * If pain score is 4 or above: No action needed, pain <4.

## 2020-11-12 NOTE — Telephone Encounter (Signed)
Opened in error

## 2020-11-18 ENCOUNTER — Other Ambulatory Visit: Payer: Self-pay

## 2020-11-18 ENCOUNTER — Encounter: Payer: Medicare Other | Admitting: *Deleted

## 2020-11-18 DIAGNOSIS — Z006 Encounter for examination for normal comparison and control in clinical research program: Secondary | ICD-10-CM

## 2020-11-18 NOTE — Research (Signed)
Subject Name: Jerry Rios  Subject met inclusion and exclusion criteria.  The informed consent form, study requirements and expectations were reviewed with the subject and questions and concerns were addressed prior to the signing of the consent form.  The subject verbalized understanding of the trial requirements.  The subject agreed to participate in the ORION 4 trial and signed the informed consent at [facility] on 11/18/20  The informed consent was obtained prior to performance of any protocol-specific procedures for the subject.  A copy of the signed informed consent was given to the subject and a copy was placed in the subject's medical record.   Timoteo Gaul

## 2020-11-18 NOTE — Research (Signed)
Subject came into research clinic today for screening for the [facility]. After the subject signed consent, their total cholesterol was checked in the lab which resulted at 230 mg/dL. This subject was set up for a randomization appointment scheduled for Friday, April 15th, 2022 @ 0800. A informed letter was sent to the subject's MD, Dr. Fransico Him.

## 2020-11-21 ENCOUNTER — Encounter: Payer: Self-pay | Admitting: Gastroenterology

## 2020-11-25 ENCOUNTER — Ambulatory Visit (INDEPENDENT_AMBULATORY_CARE_PROVIDER_SITE_OTHER): Payer: Medicare Other | Admitting: Gastroenterology

## 2020-11-25 ENCOUNTER — Encounter: Payer: Self-pay | Admitting: Gastroenterology

## 2020-11-25 ENCOUNTER — Other Ambulatory Visit: Payer: Self-pay

## 2020-11-25 VITALS — BP 90/60 | HR 45 | Ht 67.0 in | Wt 141.0 lb

## 2020-11-25 DIAGNOSIS — R1084 Generalized abdominal pain: Secondary | ICD-10-CM

## 2020-11-25 DIAGNOSIS — K581 Irritable bowel syndrome with constipation: Secondary | ICD-10-CM

## 2020-11-25 DIAGNOSIS — R110 Nausea: Secondary | ICD-10-CM

## 2020-11-25 DIAGNOSIS — R140 Abdominal distension (gaseous): Secondary | ICD-10-CM

## 2020-11-25 DIAGNOSIS — K219 Gastro-esophageal reflux disease without esophagitis: Secondary | ICD-10-CM | POA: Diagnosis not present

## 2020-11-25 DIAGNOSIS — R1013 Epigastric pain: Secondary | ICD-10-CM | POA: Diagnosis not present

## 2020-11-25 DIAGNOSIS — I2510 Atherosclerotic heart disease of native coronary artery without angina pectoris: Secondary | ICD-10-CM | POA: Diagnosis not present

## 2020-11-25 NOTE — Progress Notes (Signed)
Jerry Rios    [id number]    1950/11/17  Primary Care Physician:John, [name], MD  Referring Physician: [name], MD [address] [place],  [place] [address]   Chief complaint: Abdominal discomfort  HPI:  70 year old very pleasant gentleman here for follow-up visit with complaints of constipation, abdominal bloating, left side discomfort and intermittent nausea.  He has been experiencing worsening constipation since October, improved with psyllium and magnesium He continues to have significant abdominal bloating and discomfort in the abdomen. He did not want to do CT because of radiation risk.  He has not had the ultrasound yet  EGD November 06, 2020 - The examined esophagus was normal. - The gastroesophageal flap valve was visualized endoscopically and classified as Hill Grade II (fold present, opens with respiration). - Scattered mild inflammation characterized by congestion (edema), erosions and erythema was found in the entire examined stomach. Biopsies were taken with a cold forceps for histology. Biopsies were taken with a cold forceps for Helicobacter pylori testing. - The first portion of the duodenum and second portion of the duodenum were normal. Biopsies were taken with a cold forceps for histology. 1. Surgical [P], duodenal bulb - DUODENAL MUCOSA WITH NO SIGNIFICANT PATHOLOGIC FINDINGS. - NEGATIVE FOR INCREASED INTRAEPITHELIAL LYMPHOCYTES AND VILLOUS ARCHITECTURAL CHANGES. 2. Surgical [P], gastric antrum and gastric body - MILD CHRONIC GASTRITIS. - WARTHIN-STARRY STAIN IS NEGATIVE FOR HELICOBACTER PYLORI.  Colonoscopy 01/03/2017: By Dr. Henrene Pastor showed diverticulosis and hemorrhoids otherwise unremarkable exam   Outpatient Encounter Medications as of 11/25/2020  Medication Sig  . B Complex-C (SUPER B COMPLEX/VITAMIN C) TABS Take 1 tablet by mouth daily.  . bisacodyl (DULCOLAX) 5 MG EC tablet Take 5 mg by mouth daily as needed.  .  cetirizine (ZYRTEC) 10 MG tablet Take 10 mg by mouth daily as needed for allergies.  Marland Kitchen co-enzyme Q-10 30 MG capsule Take 100 mg by mouth as needed.  . docusate sodium (COLACE) 100 MG capsule Take 100 mg by mouth daily as needed for mild constipation.  . fluticasone (FLONASE) 50 MCG/ACT nasal spray USE 2 SPRAYS IN EACH  NOSTRIL DAILY  . Magnesium 250 MG TABS Take 1 tablet by mouth daily.  . metroNIDAZOLE (METROGEL) 1 % gel APPLY TO AFFECTED AREA(S)  TOPICALLY DAILY AS NEEDED  FOR ROSACEA  . Naphazoline-Pheniramine (OPCON-A OP) Apply 1 drop to eye as needed.  . rosuvastatin (CRESTOR) 10 MG tablet Take 1 tablet (10 mg total) by mouth daily.  . Vitamin D, Cholecalciferol, 25 MCG (1000 UT) TABS Take 1 capsule by mouth daily.   No facility-administered encounter medications on file as of 11/25/2020.    Allergies as of 11/25/2020 - Review Complete 11/25/2020  Allergen Reaction Noted  . Crestor [rosuvastatin]  04/08/2007  . Lipitor [atorvastatin]  04/08/2007  . Nexlizet [bempedoic acid-ezetimibe]  10/29/2020  . Praluent [alirocumab]  10/14/2019  . Repatha [evolocumab]  10/14/2019  . Zocor [simvastatin]  04/08/2007  . Adhesive [tape] Rash 06/20/2014    Past Medical History:  Diagnosis Date  . Abdominal pain, unspecified site 05/30/2014  . Allergic rhinitis 04/08/2007   Qualifier: Diagnosis of  By: [name] MD, [name]   . Allergy   . Arthritis    lumbar spine  . Atherosclerosis   . BACK PAIN, LUMBAR 07/10/2008   Qualifier: Diagnosis of  By: [name] MD, [name]   . BENIGN PROSTATIC HYPERTROPHY 08/05/2007   Qualifier: Diagnosis of  By: [name] MD, Jeneen Rinks  W   . BPH (benign prostatic hypertrophy)   . Cataract    starting- mild   . Cervical radiculopathy 08/01/2017  . CKD (chronic kidney disease), stage III ([place])   . COLONIC POLYPS, HX OF 08/05/2007   Adenoma polpy per last coonscopy Sep 01, 2006, Dr Henrene Pastor   . Cough 11/12/2009   Qualifier: Diagnosis of  By: [name] MD, Arvid Right.   . Depression 04/08/2007    Qualifier: Diagnosis of  By: [name] MD, [name]   . Diverticulosis of colon   . DIVERTICULOSIS, COLON 08/05/2007   Qualifier: Diagnosis of  By: [name] MD, [place], ATOPIC 11/12/2009   Qualifier: Diagnosis of  By: [name] MD, Arvid Right.   . HEMORRHOIDS 07/10/2008   Qualifier: Diagnosis of  By: [name] MD, [name]   . History of nephrolithiasis   . History of shingles   . Hx of colonic polyp   . Hyperglycemia 11/19/2014  . Hyperlipidemia   . Hypertension    some past history   . Insomnia   . Insomnia, unspecified 04/08/2007   Centricity Description: SYMPTOM, INSOMNIA NOS Qualifier: Diagnosis of  By: [name] MD, [name]  Centricity Description: INSOMNIA-SLEEP DISORDER-UNSPEC Qualifier: Diagnosis of  By: [name] MD, [place], LEFT 07/10/2008   Qualifier: Diagnosis of  By: [name] MD, [name]   . Libido, decreased 10/04/2011  . Loss of weight 05/30/2014  . Multinodular goiter 12/14/2016  . Nausea in adult patient 05/30/2014  . NEPHROLITHIASIS, HX OF 08/05/2007   Qualifier: Diagnosis of  By: [name] MD, [name]   . RENAL INSUFFICIENCY 09/07/2010   Qualifier: Diagnosis of  By: [name] MD, [name]   . Right leg pain 10/04/2018  . Stroke ([facility]) 10/2014  . TIA (transient ischemic attack)   . Vitiligo 10/31/2012    Past Surgical History:  Procedure Laterality Date  . CERVICAL DISC ARTHROPLASTY  12/08/2017  . COLONOSCOPY    . CYSTOSCOPY WITH URETEROSCOPY AND STENT PLACEMENT Left 07/04/2014   Procedure: CYSTOSCOPY WITH LEFT RETROGRADE PYLEOGRAM, LEFT URETERAL DILATION AND URETEROSCOPY, LASER LITHOTRISPY AND STENT PLACEMENT;  Surgeon: [name], MD;  Location: [facility];  Service: Urology;  Laterality: Left;  . HOLMIUM LASER APPLICATION N/A 76/09/6071   Procedure:  LASER LITHOTRIPSY;  Surgeon: [name], MD;  Location: [facility];  Service: Urology;  Laterality: N/A;  . lithotrypsy     x 3  . POLYPECTOMY    . TONSILLECTOMY      Family History  Problem Relation Age of Onset  . Ulcerative  colitis Sister   . Colitis Sister   . Heart disease Other        Grandparents  . Hyperlipidemia Other   . Hypertension Other   . Heart failure Mother   . CAD Mother        died of CAD at age 91  . Heart failure Father   . CAD Father        died from cardiac arrest at 41  . Lupus Paternal Grandfather   . Heart failure Maternal Grandfather   . Colon cancer Neg Hx   . Esophageal cancer Neg Hx   . Pancreatic cancer Neg Hx   . Stomach cancer Neg Hx   . Liver disease Neg Hx   . Colon polyps Neg Hx   . Rectal cancer Neg Hx   . Diabetes Neg Hx     Social History   Socioeconomic History  .  Marital status: Single    Spouse name: Not on file  . Number of children: Not on file  . Years of education: Not on file  . Highest education level: Not on file  Occupational History  . Occupation: IT  Tobacco Use  . Smoking status: Former Smoker    Packs/day: 0.50    Years: 10.00    Pack years: 5.00  . Smokeless tobacco: Never Used  . Tobacco comment: Regular exercise - No  Substance and Sexual Activity  . Alcohol use: Not Currently    Alcohol/week: 7.0 standard drinks    Types: 7 Cans of beer per week  . Drug use: No  . Sexual activity: Never  Other Topics Concern  . Not on file  Social History Narrative   Works at [employer], divorce, no biological children   Social Determinants of Radio broadcast assistant Strain: Not on file  Food Insecurity: Not on file  Transportation Needs: Not on file  Physical Activity: Not on file  Stress: Not on file  Social Connections: Not on file  Intimate Partner Violence: Not on file      Review of systems: All other review of systems negative except as mentioned in the HPI.   Physical Exam: Vitals:   11/25/20 1033  BP: 90/60  Pulse: (!) 45  SpO2: 98%   Body mass index is 22.08 kg/m. Gen:      No acute distress HEENT:  sclera anicteric Abd:      soft, non-tender; no palpable masses, no distension Ext:    No edema Neuro: alert  and oriented x 3 Psych: normal mood and affect  Data Reviewed:  Reviewed labs, radiology imaging, old records and pertinent past GI work up   Assessment and Plan/Recommendations:  70 year old very pleasant gentleman with abdominal discomfort, bloating and intermittent nausea  His symptoms are likely secondary to irritable bowel syndrome constipation  Advised patient to try Benefiber or any soluble fiber and stop the insoluble fiber Use MiraLAX as needed and titrate based on response to have 1-2 soft bowel movement daily We will obtain abdominal ultrasound to exclude any acute pathology    Call if he does not have any improvement of symptoms over the next few weeks   The patient was provided an opportunity to ask questions and all were answered. The patient agreed with the plan and demonstrated an understanding of the instructions.  Damaris Hippo , MD    CC: [name], MD

## 2020-11-25 NOTE — Patient Instructions (Addendum)
You have been scheduled for an abdominal ultrasound at [facility] (1st floor of hospital) on 12/02/2020 at 11:00am. Please arrive 15 minutes prior to your appointment for registration. Make certain not to have anything to eat or drink 8  hours prior to your appointment. Should you need to reschedule your appointment, please contact radiology at [phone]. This test typically takes about 30 minutes to perform.  Due to recent changes in healthcare laws, you may see the results of your imaging and laboratory studies on MyChart before your provider has had a chance to review them.  We understand that in some cases there may be results that are confusing or concerning to you. Not all laboratory results come back in the same time frame and the provider may be waiting for multiple results in order to interpret others.  Please give [place] 48 hours in order for your provider to thoroughly review all the results before contacting the office for clarification of your results.   Take Soluable fiber 1 tablespoon twice daily  Take Miralax 1 teaspoon daily, titrate as needed  Call with any worsening symptoms   I appreciate the  opportunity to care for you  Thank You   Harl Bowie , MD

## 2020-12-02 ENCOUNTER — Ambulatory Visit (HOSPITAL_COMMUNITY): Payer: Medicare Other

## 2020-12-08 ENCOUNTER — Ambulatory Visit (HOSPITAL_COMMUNITY)
Admission: RE | Admit: 2020-12-08 | Discharge: 2020-12-08 | Disposition: A | Discharge status: Home / Self Care | Payer: Medicare Other | Source: Ambulatory Visit | Attending: Gastroenterology | Admitting: Gastroenterology

## 2020-12-08 ENCOUNTER — Other Ambulatory Visit: Payer: Self-pay

## 2020-12-08 DIAGNOSIS — R1084 Generalized abdominal pain: Secondary | ICD-10-CM | POA: Insufficient documentation

## 2020-12-08 DIAGNOSIS — R110 Nausea: Secondary | ICD-10-CM | POA: Insufficient documentation

## 2020-12-08 DIAGNOSIS — R1013 Epigastric pain: Secondary | ICD-10-CM | POA: Insufficient documentation

## 2020-12-08 DIAGNOSIS — K219 Gastro-esophageal reflux disease without esophagitis: Secondary | ICD-10-CM | POA: Diagnosis present

## 2020-12-11 ENCOUNTER — Other Ambulatory Visit: Payer: Self-pay

## 2020-12-11 DIAGNOSIS — R079 Chest pain, unspecified: Secondary | ICD-10-CM

## 2020-12-12 ENCOUNTER — Other Ambulatory Visit (HOSPITAL_COMMUNITY)
Admission: RE | Admit: 2020-12-12 | Discharge: 2020-12-12 | Disposition: A | Discharge status: Home / Self Care | Payer: Medicare Other | Source: Ambulatory Visit | Attending: Cardiology | Admitting: Cardiology

## 2020-12-12 DIAGNOSIS — Z20822 Contact with and (suspected) exposure to covid-19: Secondary | ICD-10-CM | POA: Diagnosis not present

## 2020-12-12 DIAGNOSIS — Z01812 Encounter for preprocedural laboratory examination: Secondary | ICD-10-CM | POA: Insufficient documentation

## 2020-12-13 LAB — SARS CORONAVIRUS 2 (TAT 6-24 HRS): SARS Coronavirus 2: NEGATIVE

## 2020-12-14 NOTE — Progress Notes (Signed)
Shared Decision Making/Informed Consent    :[id number]} The risks [chest pain, shortness of breath, cardiac arrhythmias, dizziness, blood pressure fluctuations, myocardial infarction, stroke/transient ischemic attack, and life-threatening complications (estimated to be 1 in 10,000)], benefits (risk stratification, diagnosing coronary artery disease, treatment guidance) and alternatives of an exercise tolerance test were discussed in detail with Jerry Rios and he agrees to proceed.

## 2020-12-15 ENCOUNTER — Ambulatory Visit (INDEPENDENT_AMBULATORY_CARE_PROVIDER_SITE_OTHER): Payer: Medicare Other

## 2020-12-15 ENCOUNTER — Other Ambulatory Visit: Payer: Self-pay

## 2020-12-15 DIAGNOSIS — I2510 Atherosclerotic heart disease of native coronary artery without angina pectoris: Secondary | ICD-10-CM | POA: Diagnosis not present

## 2020-12-15 LAB — EXERCISE TOLERANCE TEST
Estimated workload: 10.1 METS
Exercise duration (min): 9 min
Exercise duration (sec): 0 s
MPHR: 150 {beats}/min
Peak HR: 139 {beats}/min
Percent HR: 92 %
RPE: 15
Rest HR: 53 {beats}/min

## 2020-12-16 ENCOUNTER — Telehealth: Payer: Self-pay

## 2020-12-16 DIAGNOSIS — R079 Chest pain, unspecified: Secondary | ICD-10-CM

## 2020-12-16 DIAGNOSIS — I2510 Atherosclerotic heart disease of native coronary artery without angina pectoris: Secondary | ICD-10-CM

## 2020-12-16 NOTE — Telephone Encounter (Signed)
-----   Message from Sueanne Margarita, MD sent at 12/15/2020  2:30 PM EDT ----- Abnormal ETT which may be due to BP getting too high during study. Needs to have [place].  He was just consented for ETT with same risks as a Lexiscan myoview so should not need reconsenting.

## 2020-12-16 NOTE — Telephone Encounter (Signed)
The patient has been notified of the result and verbalized understanding.  All questions (if any) were answered. Antonieta Iba, RN 12/16/2020 5:06 PM  Leane Call has been ordered.

## 2020-12-17 NOTE — Telephone Encounter (Signed)
Shared Decision Making/Informed Consent The risks [chest pain, shortness of breath, cardiac arrhythmias, dizziness, blood pressure fluctuations, myocardial infarction, stroke/transient ischemic attack, nausea, vomiting, allergic reaction, radiation exposure, metallic taste sensation and life-threatening complications (estimated to be 1 in 10,000)], benefits (risk stratification, diagnosing coronary artery disease, treatment guidance) and alternatives of a nuclear stress test were discussed in detail with Jerry Rios and he agrees to proceed.

## 2020-12-18 ENCOUNTER — Encounter: Payer: Self-pay | Admitting: Gastroenterology

## 2020-12-21 ENCOUNTER — Telehealth (HOSPITAL_COMMUNITY): Payer: Self-pay

## 2020-12-21 NOTE — Telephone Encounter (Signed)
Spoke with the patient, detailed instructions given. He stated that he understood and would be here for his test. Asked to call back with any questions. S.Williams EMTP 

## 2020-12-22 ENCOUNTER — Ambulatory Visit (HOSPITAL_COMMUNITY): Payer: Medicare Other | Attending: Cardiovascular Disease

## 2020-12-22 ENCOUNTER — Other Ambulatory Visit: Payer: Self-pay

## 2020-12-22 DIAGNOSIS — R079 Chest pain, unspecified: Secondary | ICD-10-CM | POA: Diagnosis not present

## 2020-12-22 DIAGNOSIS — I2510 Atherosclerotic heart disease of native coronary artery without angina pectoris: Secondary | ICD-10-CM | POA: Diagnosis not present

## 2020-12-22 LAB — MYOCARDIAL PERFUSION IMAGING
LV dias vol: 86 mL (ref 62–150)
LV sys vol: 36 mL
SDS: 1
SRS: 0
SSS: 1
TID: 1.05

## 2020-12-22 MED ORDER — REGADENOSON 0.4 MG/5ML IV SOLN
0.4000 mg | Freq: Once | INTRAVENOUS | Status: AC
Start: 2020-12-22 — End: 2020-12-22
  Administered 2020-12-22: 0.4 mg via INTRAVENOUS

## 2020-12-22 MED ORDER — TECHNETIUM TC 99M TETROFOSMIN IV KIT
31.7000 | PACK | Freq: Once | INTRAVENOUS | Status: AC | PRN
  Administered 2020-12-22: 31.7 via INTRAVENOUS
  Filled 2020-12-22: qty 32

## 2020-12-22 MED ORDER — TECHNETIUM TC 99M TETROFOSMIN IV KIT
10.3000 | PACK | Freq: Once | INTRAVENOUS | Status: AC | PRN
  Administered 2020-12-22: 10.3 via INTRAVENOUS
  Filled 2020-12-22: qty 11

## 2020-12-24 ENCOUNTER — Other Ambulatory Visit: Payer: Self-pay | Admitting: Cardiology

## 2021-01-07 ENCOUNTER — Encounter: Payer: Medicare Other | Admitting: *Deleted

## 2021-01-07 ENCOUNTER — Other Ambulatory Visit: Payer: Self-pay

## 2021-01-07 DIAGNOSIS — Z006 Encounter for examination for normal comparison and control in clinical research program: Secondary | ICD-10-CM

## 2021-01-07 NOTE — Research (Signed)
Patient came to research clinic today for their Randomization visit for the [facility]. All concomitant medications were reviewed and updated if applicable. There were no new AE's or SAE's to report to the sponsor at this time. Subject was successfully randomized into the [facility] and was given their IP injection subcutaneously into the right lower abdomen with no issues. Patient's next visit was scheduled for Friday, July 15th, 2022 @ 1100.

## 2021-01-18 ENCOUNTER — Other Ambulatory Visit: Payer: Self-pay

## 2021-01-18 DIAGNOSIS — R634 Abnormal weight loss: Secondary | ICD-10-CM

## 2021-01-18 DIAGNOSIS — R1013 Epigastric pain: Secondary | ICD-10-CM

## 2021-01-18 DIAGNOSIS — R110 Nausea: Secondary | ICD-10-CM

## 2021-01-18 DIAGNOSIS — K581 Irritable bowel syndrome with constipation: Secondary | ICD-10-CM

## 2021-03-02 DIAGNOSIS — K2950 Unspecified chronic gastritis without bleeding: Secondary | ICD-10-CM | POA: Insufficient documentation

## 2021-03-03 ENCOUNTER — Ambulatory Visit (INDEPENDENT_AMBULATORY_CARE_PROVIDER_SITE_OTHER): Payer: Medicare Other | Admitting: Internal Medicine

## 2021-03-03 ENCOUNTER — Other Ambulatory Visit: Payer: Self-pay

## 2021-03-03 ENCOUNTER — Encounter: Payer: Self-pay | Admitting: Internal Medicine

## 2021-03-03 VITALS — BP 100/60 | HR 50 | Temp 98.1°F | Ht 67.0 in | Wt 136.2 lb

## 2021-03-03 DIAGNOSIS — N1831 Chronic kidney disease, stage 3a: Secondary | ICD-10-CM

## 2021-03-03 DIAGNOSIS — L989 Disorder of the skin and subcutaneous tissue, unspecified: Secondary | ICD-10-CM | POA: Diagnosis not present

## 2021-03-03 DIAGNOSIS — L84 Corns and callosities: Secondary | ICD-10-CM

## 2021-03-03 DIAGNOSIS — R739 Hyperglycemia, unspecified: Secondary | ICD-10-CM

## 2021-03-03 DIAGNOSIS — M79671 Pain in right foot: Secondary | ICD-10-CM

## 2021-03-03 DIAGNOSIS — I2510 Atherosclerotic heart disease of native coronary artery without angina pectoris: Secondary | ICD-10-CM | POA: Diagnosis not present

## 2021-03-03 DIAGNOSIS — K130 Diseases of lips: Secondary | ICD-10-CM

## 2021-03-03 MED ORDER — TRIAMCINOLONE ACETONIDE 0.1 % MT PSTE: 1.0000 "application " | PASTE | Freq: Two times a day (BID) | OROMUCOSAL | 12 refills | Status: DC

## 2021-03-03 NOTE — Patient Instructions (Addendum)
Ok to use the OTC Voltaren gel topical for the right ankle pain as needed  Please take all new medication as prescribed - the kenalog in orabase for the cystic area in the lip  Please continue all other medications as before, and refills have been done if requested.  Please have the pharmacy call with any other refills you may need.  Please keep your appointments with your specialists as you may have planned  You will be contacted regarding the referral for: podiatry for the foot and ankle, dermatology for the skin lesion  Please call if need the oral surgury referral for the cyst to the lip if not improved

## 2021-03-08 ENCOUNTER — Encounter: Payer: Self-pay | Admitting: Internal Medicine

## 2021-03-08 DIAGNOSIS — L84 Corns and callosities: Secondary | ICD-10-CM | POA: Insufficient documentation

## 2021-03-08 DIAGNOSIS — M79671 Pain in right foot: Secondary | ICD-10-CM | POA: Insufficient documentation

## 2021-03-08 DIAGNOSIS — K130 Diseases of lips: Secondary | ICD-10-CM | POA: Insufficient documentation

## 2021-03-08 DIAGNOSIS — L989 Disorder of the skin and subcutaneous tissue, unspecified: Secondary | ICD-10-CM | POA: Insufficient documentation

## 2021-03-08 NOTE — Assessment & Plan Note (Addendum)
Worsening last few months, for podiatry referral

## 2021-03-08 NOTE — Assessment & Plan Note (Signed)
Left lower inner lesion most likely traumatic, ok for kenalog in orabase, consider oral surgury if not imrpoved

## 2021-03-08 NOTE — Assessment & Plan Note (Signed)
Lab Results  Component Value Date   HGBA1C 5.7 10/27/2020   Stable, pt to continue current medical treatment  - diet

## 2021-03-08 NOTE — Assessment & Plan Note (Signed)
Lab Results  Component Value Date   CREATININE 1.46 10/27/2020   Stable overall, cont to avoid nephrotoxins

## 2021-03-08 NOTE — Assessment & Plan Note (Signed)
Enlarging, does not appear malignant, for derm referral per pt request

## 2021-03-08 NOTE — Progress Notes (Signed)
Patient ID: Jerry Rios, male   DOB: [birth year], 70 y.o.   MRN: [id number]        Chief Complaint: right ankle pain, low blood pressure, left lower inner lip lesion, right back skin lesion, hyperglycemia, ckd       HPI:  Jerry Rios is a 70 y.o. male here with c/o right lateral ankle pain, mild, intermittent, just below the lateral malleolus but no sweling or tenderness, worse to walk, better to rest and sit, nothing else makes better or worse.  Not unstable or falls.  Also with lower BP noted today but Pt denies chest pain, increased sob or doe, wheezing, orthopnea, PND, increased LE swelling, palpitations, dizziness or syncope.  Also c/o left lower inner lip cystic lesion mild tender and admits to some biting of the area with the lower teeth on occasion; also has an enlarging right lower back fleshy lesion slightly raised getting larger so asks for derm referral  Also has a corn like lesion to the distal plantar foot at the 5th mtp area       Wt Readings from Last 3 Encounters:  03/03/21 136 lb 3.2 oz (61.8 kg)  12/22/20 141 lb (64 kg)  11/25/20 141 lb (64 kg)   BP Readings from Last 3 Encounters:  03/03/21 100/60  11/25/20 90/60  11/06/20 (!) 102/54         Past Medical History:  Diagnosis Date   Abdominal pain, unspecified site 05/30/2014   Allergic rhinitis 04/08/2007   Qualifier: Diagnosis of  By: [name] MD, [name]    Allergy    Arthritis    lumbar spine   Atherosclerosis    BACK PAIN, LUMBAR 07/10/2008   Qualifier: Diagnosis of  By: [name] MD, [place] PROSTATIC HYPERTROPHY 08/05/2007   Qualifier: Diagnosis of  By: [name] MD, [name]    BPH (benign prostatic hypertrophy)    Cataract    starting- mild    Cervical radiculopathy 08/01/2017   CKD (chronic kidney disease), stage III ([place])    COLONIC POLYPS, HX OF 08/05/2007   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    Depression  04/08/2007   Qualifier: Diagnosis of  By: [name] MD, [name]    Diverticulosis of colon    DIVERTICULOSIS, COLON 08/05/2007   Qualifier: Diagnosis of  By: [name] MD, [place], ATOPIC 11/12/2009   Qualifier: Diagnosis of  By: [name] MD, Arvid Right.    HEMORRHOIDS 07/10/2008   Qualifier: Diagnosis of  By: [name] MD, [name]    History of nephrolithiasis    History of shingles    Hx of colonic polyp    Hyperglycemia 11/19/2014   Hyperlipidemia    Hypertension    some past history    Insomnia    Insomnia, unspecified 04/08/2007   Centricity Description: SYMPTOM, INSOMNIA NOS Qualifier: Diagnosis of  By: [name] MD, [name]  Centricity Description: INSOMNIA-SLEEP DISORDER-UNSPEC Qualifier: Diagnosis of  By: [name] MD, [name]    KNEE PAIN, LEFT 07/10/2008   Qualifier: Diagnosis of  By: [name] MD, [name]    Libido, decreased 10/04/2011   Loss of weight 05/30/2014   Multinodular goiter 12/14/2016   Nausea in adult patient 05/30/2014   NEPHROLITHIASIS, HX OF 08/05/2007   Qualifier: Diagnosis of  By: [name] MD, [name]  RENAL INSUFFICIENCY 09/07/2010   Qualifier: Diagnosis of  By: [name] MD, [name]    Right leg pain 10/04/2018   Stroke ([place]) 10/2014   TIA (transient ischemic attack)    Vitiligo 10/31/2012   Past Surgical History:  Procedure Laterality Date   CERVICAL DISC ARTHROPLASTY  12/08/2017   COLONOSCOPY     CYSTOSCOPY WITH URETEROSCOPY AND STENT PLACEMENT Left 07/04/2014   Procedure: CYSTOSCOPY WITH LEFT RETROGRADE PYLEOGRAM, LEFT URETERAL DILATION AND URETEROSCOPY, LASER LITHOTRISPY AND STENT PLACEMENT;  Surgeon: [name], MD;  Location: [facility];  Service: Urology;  Laterality: Left;   HOLMIUM LASER APPLICATION N/A 61/12/4313   Procedure:  LASER LITHOTRIPSY;  Surgeon: [name], MD;  Location: [facility];  Service: Urology;  Laterality: N/A;   lithotrypsy     x 3   POLYPECTOMY     TONSILLECTOMY      reports that he has quit smoking. He has a 5.00 pack-year smoking history. He has  never used smokeless tobacco. He reports previous alcohol use of about 7.0 standard drinks of alcohol per week. He reports that he does not use drugs. family history includes CAD in his father and mother; Colitis in his sister; Heart disease in an other family member; Heart failure in his father, maternal grandfather, and mother; Hyperlipidemia in an other family member; Hypertension in an other family member; Lupus in his paternal grandfather; Ulcerative colitis in his sister. Allergies  Allergen Reactions   Crestor [Rosuvastatin]     REACTION: myalgia   Lipitor [Atorvastatin]     REACTION: myalgia   Nexlizet [Bempedoic Acid-Ezetimibe]     Myalgia in fingers, hands, feet   Praluent [Alirocumab]     ineffective   Repatha [Evolocumab]     ineffective   Zocor [Simvastatin]     REACTION: myalgia   Adhesive [Tape] Rash   Current Outpatient Medications on File Prior to Visit  Medication Sig Dispense Refill   B Complex-C (SUPER B COMPLEX/VITAMIN C) TABS Take 1 tablet by mouth daily.     bisacodyl (DULCOLAX) 5 MG EC tablet Take 5 mg by mouth daily as needed.     cetirizine (ZYRTEC) 10 MG tablet Take 10 mg by mouth daily as needed for allergies.     co-enzyme Q-10 30 MG capsule Take 100 mg by mouth as needed.     docusate sodium (COLACE) 100 MG capsule Take 100 mg by mouth daily as needed for mild constipation.     fluticasone (FLONASE) 50 MCG/ACT nasal spray USE 2 SPRAYS IN EACH  NOSTRIL DAILY 48 g 1   Magnesium 250 MG TABS Take 1 tablet by mouth daily.     metroNIDAZOLE (METROGEL) 1 % gel APPLY TO AFFECTED AREA(S)  TOPICALLY DAILY AS NEEDED  FOR ROSACEA 120 g 1   Naphazoline-Pheniramine (OPCON-A OP) Apply 1 drop to eye as needed.     rosuvastatin (CRESTOR) 10 MG tablet TAKE 1 TABLET(10 MG) BY MOUTH DAILY 90 tablet 3   Vitamin D, Cholecalciferol, 25 MCG (1000 UT) TABS Take 1 capsule by mouth daily.     No current facility-administered medications on file prior to visit.        ROS:  All  others reviewed and negative.  Objective        PE:  BP 100/60 (BP Location: Left Arm, Patient Position: Sitting, Cuff Size: Large)   Pulse (!) 50   Temp 98.1 F (36.7 C) (Oral)   Ht 5\' 7"  (1.702 m)   Wt 136  lb 3.2 oz (61.8 kg)   SpO2 98%   BMI 21.33 kg/m                 Constitutional: Pt appears in NAD               HENT: Head: NCAT.                Right Ear: External ear normal.                 Left Ear: External ear normal.                Eyes: . Pupils are equal, round, and reactive to light. Conjunctivae and EOM are normal; left lower inner lip with 5 mm slightly raised tender cystic like lesion               Nose: without d/c or deformity               Neck: Neck supple. Gross normal ROM               Cardiovascular: Normal rate and regular rhythm.                 Pulmonary/Chest: Effort normal and breath sounds without rales or wheezing.                Right ankle with lateral tarsal tunnell tender without sweling redness               Neurological: Pt is alert. At baseline orientation, motor grossly intact               Skin: LE edema - none, right plantar foot with corn at the 5th mtp aea                Right lower back with 3/4 cm slightly raised fleshy colored soft lesion               Psychiatric: Pt behavior is normal without agitation   Micro: none  Cardiac tracings I have personally interpreted today:  none  Pertinent Radiological findings (summarize): none   Lab Results  Component Value Date   WBC 4.8 10/27/2020   HGB 13.1 10/27/2020   HCT 38.0 (L) 10/27/2020   PLT 211.0 10/27/2020   GLUCOSE 88 10/27/2020   CHOL 174 10/27/2020   TRIG 38.0 10/27/2020   HDL 98.00 10/27/2020   LDLDIRECT 206.1 11/01/2013   LDLCALC 68 10/27/2020   ALT 26 10/27/2020   AST 29 10/27/2020   NA 138 10/27/2020   K 3.9 10/27/2020   CL 100 10/27/2020   CREATININE 1.46 10/27/2020   BUN 23 10/27/2020   CO2 28 10/27/2020   TSH 1.52 10/03/2019   PSA 1.66 10/03/2019   HGBA1C  5.7 10/27/2020   Assessment/Plan:  Jerry Rios is a 70 y.o. White or Caucasian [1] male with  has a past medical history of Abdominal pain, unspecified site (05/30/2014), Allergic rhinitis (04/08/2007), Allergy, Arthritis, Atherosclerosis, BACK PAIN, LUMBAR (07/10/2008), BENIGN PROSTATIC HYPERTROPHY (08/05/2007), BPH (benign prostatic hypertrophy), Cataract, Cervical radiculopathy (08/01/2017), CKD (chronic kidney disease), stage III ([place]), COLONIC POLYPS, HX OF (08/05/2007), Cough (11/12/2009), Depression (04/08/2007), Diverticulosis of colon, DIVERTICULOSIS, COLON (08/05/2007), ECZEMA, ATOPIC (11/12/2009), HEMORRHOIDS (07/10/2008), History of nephrolithiasis, History of shingles, colonic polyp, Hyperglycemia (11/19/2014), Hyperlipidemia, Hypertension, Insomnia, Insomnia, unspecified (04/08/2007), KNEE PAIN, LEFT (07/10/2008), Libido, decreased (10/04/2011), Loss of weight (05/30/2014), Multinodular goiter (12/14/2016), Nausea in adult patient (05/30/2014), NEPHROLITHIASIS, HX OF (08/05/2007), RENAL  INSUFFICIENCY (09/07/2010), Right leg pain (10/04/2018), Stroke ([place]) (10/2014), TIA (transient ischemic attack), and Vitiligo (10/31/2012).  Hyperglycemia Lab Results  Component Value Date   HGBA1C 5.7 10/27/2020   Stable, pt to continue current medical treatment  - diet   CKD (chronic kidney disease) stage 3, GFR 30-59 ml/min Lab Results  Component Value Date   CREATININE 1.46 10/27/2020   Stable overall, cont to avoid nephrotoxins   Skin lesion of back Enlarging, does not appear malignant, for derm referral per pt request  Pre-ulcerative corn or callous Worsening last few months, for podiatry referral  Right foot pain C/w possible tarsal tunnel - for podiatry referral  Lip lesion Left lower inner lesion most likely traumatic, ok for kenalog in orabase, consider oral surgury if not imrpoved  Followup: Return if symptoms worsen or fail to improve.  Cathlean Cower, MD 03/08/2021 8:23 PM [facility]

## 2021-03-08 NOTE — Assessment & Plan Note (Signed)
C/w possible tarsal tunnel - for podiatry referral

## 2021-03-11 ENCOUNTER — Ambulatory Visit (INDEPENDENT_AMBULATORY_CARE_PROVIDER_SITE_OTHER): Payer: Medicare Other

## 2021-03-11 ENCOUNTER — Encounter: Payer: Self-pay | Admitting: Podiatry

## 2021-03-11 ENCOUNTER — Ambulatory Visit (INDEPENDENT_AMBULATORY_CARE_PROVIDER_SITE_OTHER): Payer: Medicare Other | Admitting: Podiatry

## 2021-03-11 ENCOUNTER — Other Ambulatory Visit: Payer: Self-pay

## 2021-03-11 DIAGNOSIS — M79671 Pain in right foot: Secondary | ICD-10-CM | POA: Diagnosis not present

## 2021-03-11 DIAGNOSIS — I2510 Atherosclerotic heart disease of native coronary artery without angina pectoris: Secondary | ICD-10-CM | POA: Diagnosis not present

## 2021-03-11 DIAGNOSIS — Q828 Other specified congenital malformations of skin: Secondary | ICD-10-CM | POA: Diagnosis not present

## 2021-03-11 DIAGNOSIS — M779 Enthesopathy, unspecified: Secondary | ICD-10-CM

## 2021-03-11 DIAGNOSIS — M778 Other enthesopathies, not elsewhere classified: Secondary | ICD-10-CM

## 2021-03-11 MED ORDER — TRIAMCINOLONE ACETONIDE 10 MG/ML IJ SUSP
10.0000 mg | Freq: Once | INTRAMUSCULAR | Status: AC
Start: 2021-03-11 — End: 2021-03-11
  Administered 2021-03-11: 10 mg

## 2021-03-11 NOTE — Progress Notes (Signed)
Subjective:   Patient ID: Jerry Rios, male   DOB: 70 y.o.   MRN: [id number]   HPI Patient presents stating he has a lot of pain in his right ankle and it seems somewhat better but still very tender and also he is getting discomfort underneath his metatarsal right over left with history of using salicylic acid at home.  Patient does not smoke currently likes to be active   Review of Systems  All other systems reviewed and are negative.      Objective:  Physical Exam Vitals and nursing note reviewed.  Constitutional:      Appearance: He is well-developed.  Pulmonary:     Effort: Pulmonary effort is normal.  Musculoskeletal:        General: Normal range of motion.  Skin:    General: Skin is warm.  Neurological:     Mental Status: He is alert.    Neurovascular status intact muscle strength adequate range of motion adequate with inflammation fluid buildup around the sinus tarsi right that is painful when pressed and makes walking difficult.  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  Patient will be seen back to recheck  X-rays indicate no indications of bone pathology or arthritis condition associated with pain

## 2021-03-19 ENCOUNTER — Other Ambulatory Visit: Payer: Self-pay | Admitting: Podiatry

## 2021-03-19 DIAGNOSIS — M778 Other enthesopathies, not elsewhere classified: Secondary | ICD-10-CM

## 2021-03-28 IMAGING — US US THYROID
1 series · 12 of 25 positions shown · non-contrast
Comparison: 12/20/2016;

CLINICAL DATA: Prior ultrasound follow-up. Follow-up thyroid
nodules. History of right inferior and left mid/inferior thyroid
nodule fine-needle aspiration performed 12/02/2014

EXAM:
THYROID ULTRASOUND
TECHNIQUE: Ultrasound examination of the thyroid gland and adjacent soft
tissues was performed.

[Series 1: us thyroid · 0.04mm/px · 12 of 43 slices shown]
[im 2/43]
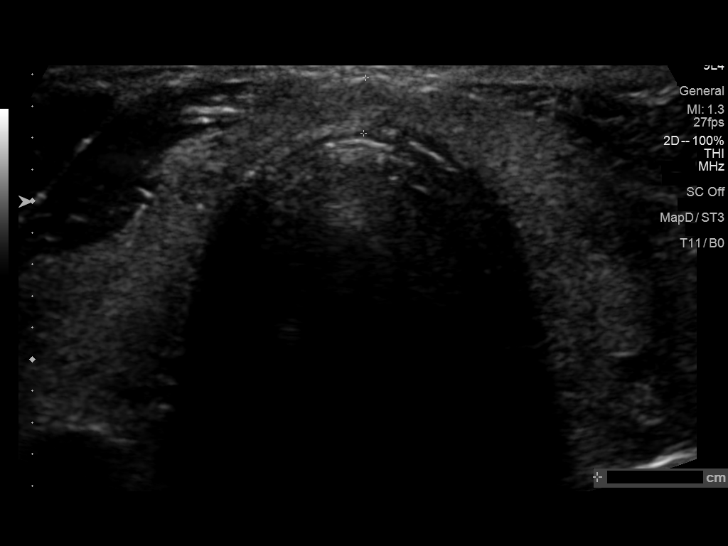
[im 6/43]
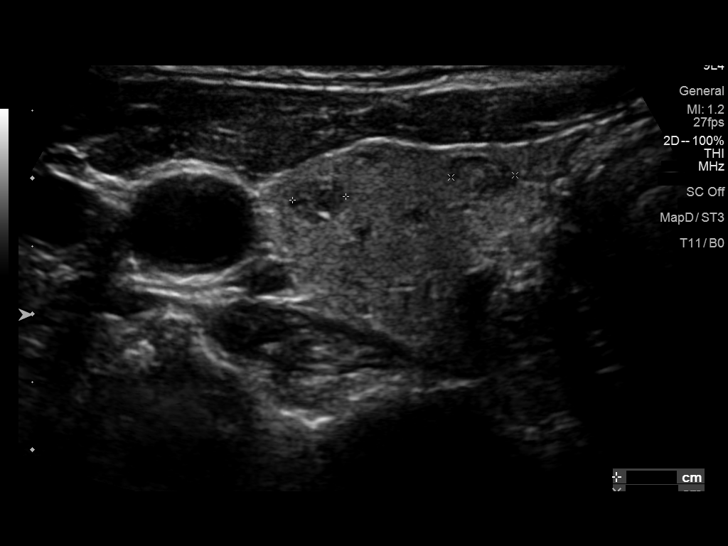
[im 9/43]
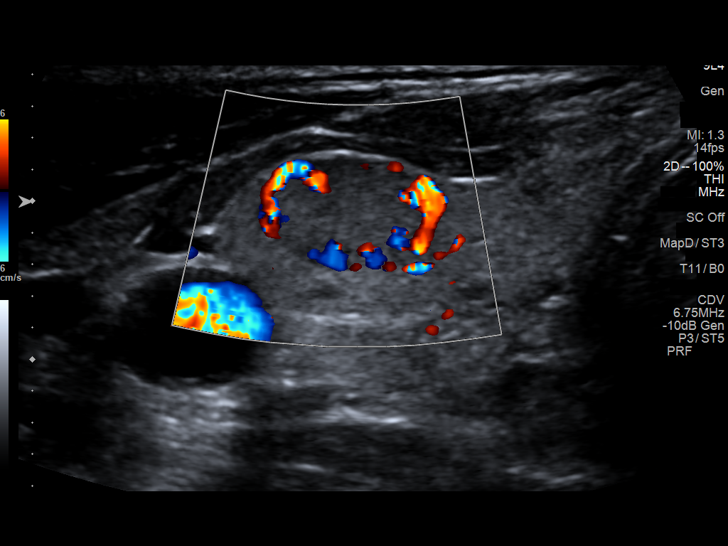
[im 13/43]
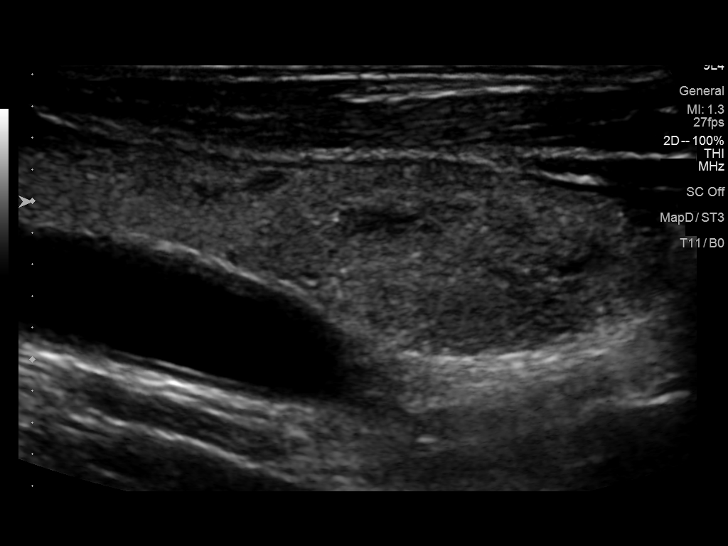
[im 16/43]
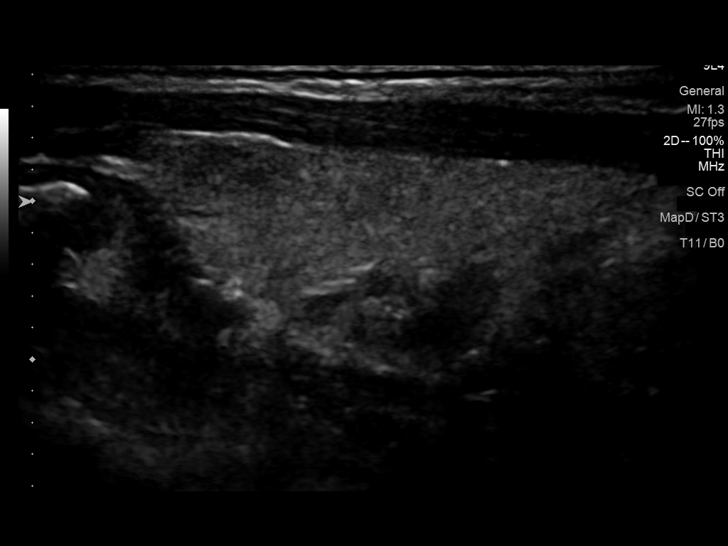
[im 20/43]
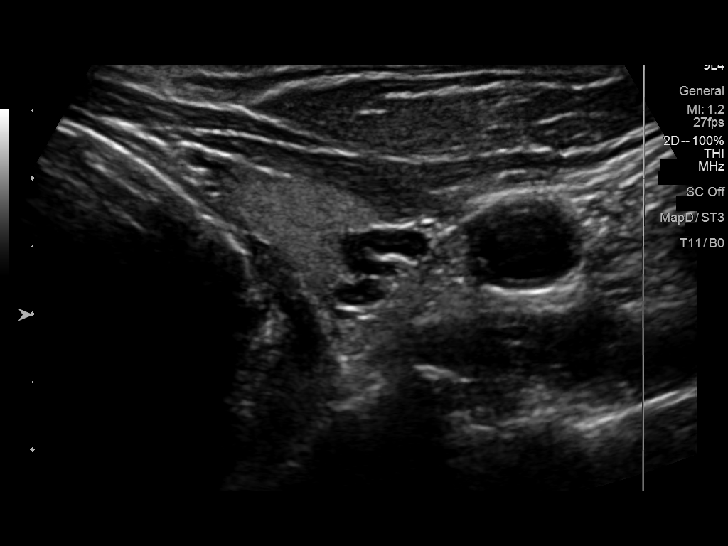
[im 23/43]
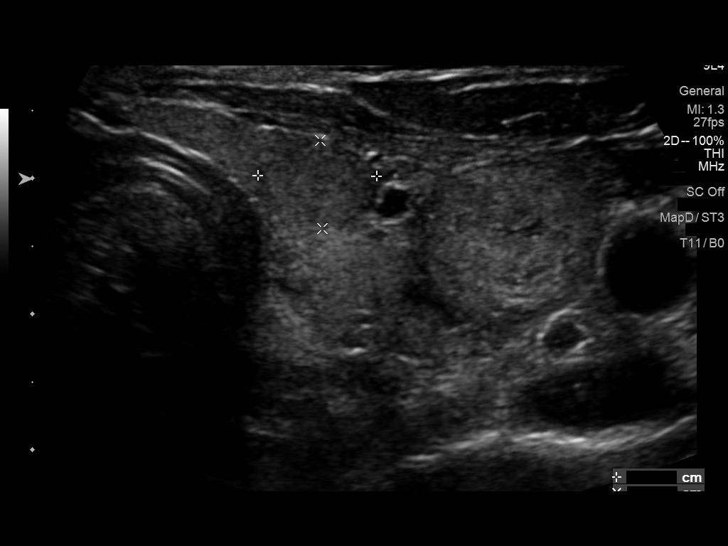
[im 27/43]
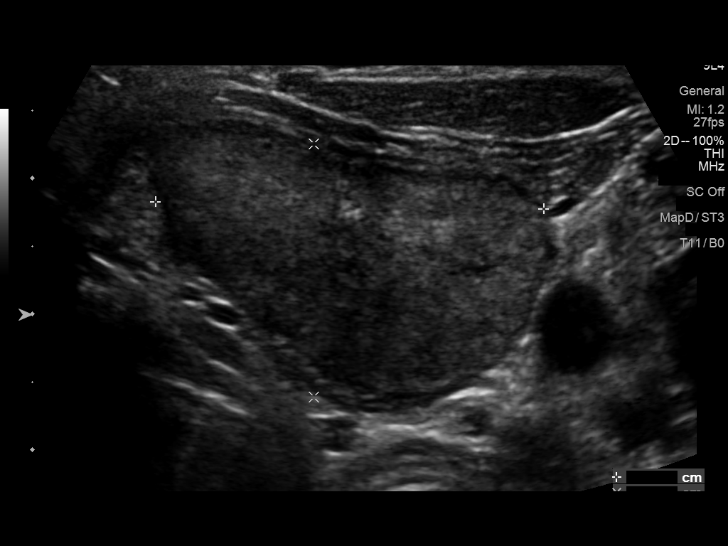
[im 30/43]
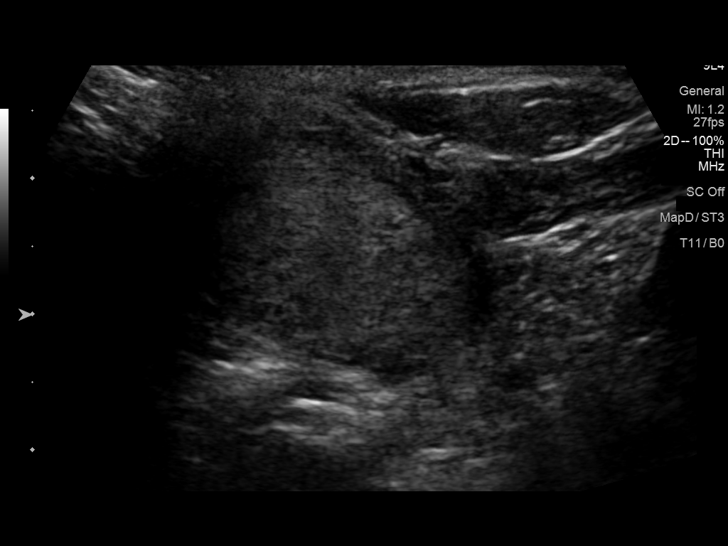
[im 34/43]
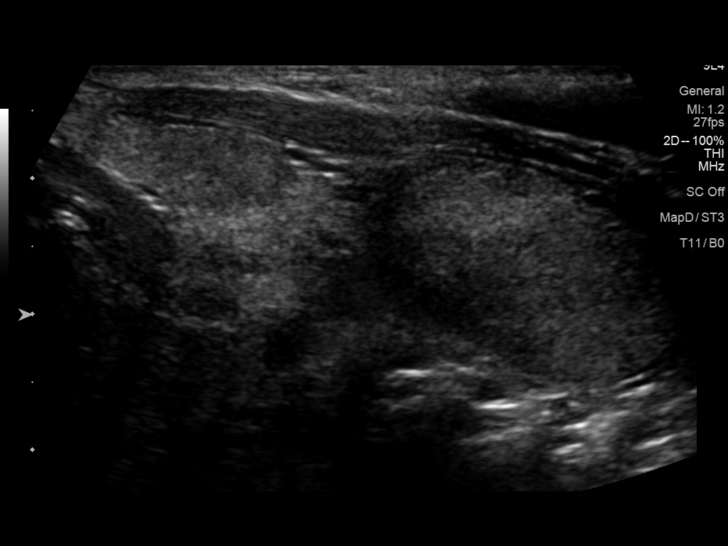
[im 37/43]
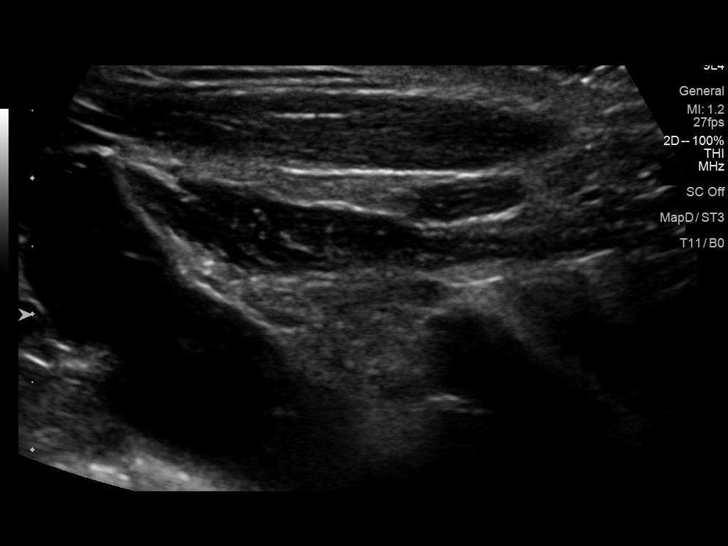
[im 41/43]
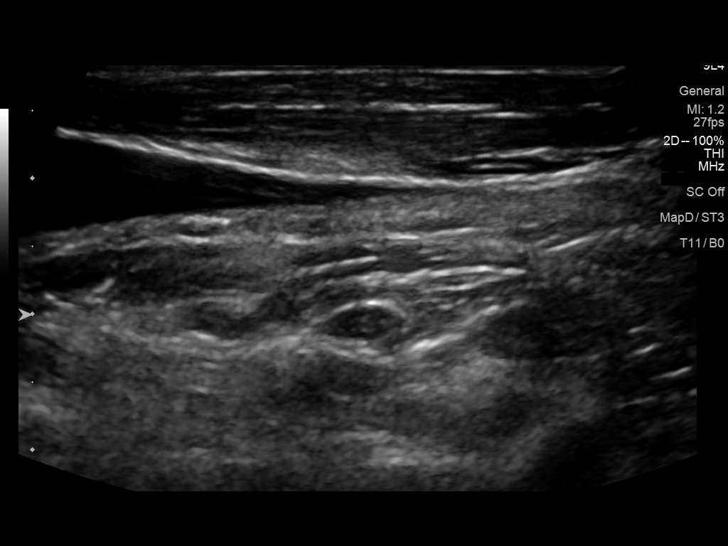

[12 of 25 positions shown; findings below may reference images not displayed]

11/27/2014; ultrasound-guided right
inferior and left mid/inferior thyroid nodule fine-needle
aspiration-12/02/2014
FINDINGS: Parenchymal Echotexture: Mildly heterogenous

Isthmus: Normal in size measuring 0.4 cm in diameter, unchanged

Right lobe: Borderline enlarged measuring 5.7 x 1.4 x 2.1 cm,
previously, 5.3 x 1.6 x 2.3 cm

Left lobe: Borderline enlarged measuring 5.6 x 2.1 x 3.1 cm,
previously, 6.0 x 2.2 x 2.1 cm

_________________________________________________________

Estimated total number of nodules >/= 1 cm: 4

Number of spongiform nodules >/=  2 cm not described below (TR1): 0

Number of mixed cystic and solid nodules >/= 1.5 cm not described
below (TR2): 0

_________________________________________________________

The approximately 1.1 cm isoechoic ill-defined nodule within the mid
aspect the right lobe of the thyroid (labeled 1), is unchanged
compared to the [DATE] examination, previously, 1.0 cm. Stability
for greater than 5 years is indicative of benign etiology.

_________________________________________________________

The previously biopsied approximately 2.7 x 2.0 x 1.1 cm hypoechoic
ill-defined nodule within the inferior pole the right lobe of the
thyroid (labeled 2), is unchanged to slightly decreased in size
compared to the [DATE] examination, previously, 2.7 x 2.2 x 1.5 cm.
Correlation with previous biopsy results is advised.

_________________________________________________________

There are 2 punctate (sub 5 mm) hypoechoic nodules within the
superior pole of the right lobe of the thyroid, neither of which
meet imaging criteria to recommend percutaneous sampling or
continued dedicated follow-up.

_________________________________________________________

The approximately 1.1 x 0.9 x 0.7 cm hypoechoic ill-defined nodule
within the mid aspect the left lobe of the thyroid (labeled 3), is
unchanged compared to the [DATE] examination, previously, 1.1 cm.

_________________________________________________________

The previously biopsied approximately 3.6 x 2.9 x 1.7 cm hypoechoic
nodule/mass with mid/inferior pole the left lobe of the thyroid
(labeled 4), is unchanged compared to the [DATE] examination,
previously, 3.9 x 2.6 x 2.2 cm, with slight size differences
attributable to scan plane projection. Correlation with previous
biopsy results is advised.
IMPRESSION: 1. Similar findings of multinodular goiter. No worrisome new or
enlarging thyroid nodules.
2. Previously biopsied dominant bilateral thyroid nodules (labeled
#2 and #4), are unchanged compared to the [DATE] examination.
Correlation with previous biopsy results is advised. Assuming a
benign pathologic diagnosis, repeat sampling and/or continued
dedicated follow-up is not recommended.
3. Additional discretely measured thyroid nodules (labeled #1 and
#3), unchanged compared to the [DATE] examination. Stability for
greater than 5 years is indicative of a benign etiology and as such
continued dedicated follow-up is not recommended.

The above is in keeping with the ACR TI-RADS recommendations - [HOSPITAL] 3778;[DATE].

## 2021-04-09 ENCOUNTER — Other Ambulatory Visit: Payer: Self-pay

## 2021-04-09 ENCOUNTER — Encounter: Payer: Medicare Other | Admitting: *Deleted

## 2021-04-09 DIAGNOSIS — Z006 Encounter for examination for normal comparison and control in clinical research program: Secondary | ICD-10-CM

## 2021-04-09 NOTE — Research (Signed)
Patient came into the research clinic today for their follow up visit in the [facility] 4 research trail. Subject did have their blood drawn today. All concomitant medications were reviewed and updated if applicable. There are no new AE's or SAE's to report to sponsor at this time. Subject received their IP injection, subcutaneously, into their left lower abdomen. The subject remained in the research clinic for 15 minutes after injection just to monitor since they were complaining of stinging at the sight. Subject felt relief before they were able to leave. Next appointment scheduled for Friday, January 20th, 2023 @ 11:00 am.

## 2021-04-28 ENCOUNTER — Other Ambulatory Visit: Payer: Self-pay | Admitting: Cardiology

## 2021-04-29 ENCOUNTER — Other Ambulatory Visit: Payer: Self-pay | Admitting: Cardiology

## 2021-08-23 ENCOUNTER — Encounter: Payer: Self-pay | Admitting: Internal Medicine

## 2021-08-23 DIAGNOSIS — K130 Diseases of lips: Secondary | ICD-10-CM

## 2021-09-14 ENCOUNTER — Encounter: Payer: Self-pay | Admitting: Internal Medicine

## 2021-09-16 MED ORDER — METRONIDAZOLE 1 % EX GEL: CUTANEOUS | 1 refills | Status: DC

## 2021-10-14 ENCOUNTER — Other Ambulatory Visit: Payer: Self-pay

## 2021-10-14 ENCOUNTER — Ambulatory Visit (INDEPENDENT_AMBULATORY_CARE_PROVIDER_SITE_OTHER): Payer: Medicare Other | Admitting: Cardiology

## 2021-10-14 ENCOUNTER — Encounter: Payer: Self-pay | Admitting: Cardiology

## 2021-10-14 ENCOUNTER — Encounter: Payer: Self-pay | Admitting: *Deleted

## 2021-10-14 VITALS — BP 94/56 | HR 62 | Ht 67.0 in | Wt 134.0 lb

## 2021-10-14 DIAGNOSIS — E7800 Pure hypercholesterolemia, unspecified: Secondary | ICD-10-CM

## 2021-10-14 DIAGNOSIS — I10 Essential (primary) hypertension: Secondary | ICD-10-CM | POA: Diagnosis not present

## 2021-10-14 DIAGNOSIS — I2510 Atherosclerotic heart disease of native coronary artery without angina pectoris: Secondary | ICD-10-CM | POA: Diagnosis not present

## 2021-10-14 NOTE — Progress Notes (Signed)
Patient ID: Jerry Rios, male   DOB: April 02, 1951, 71 y.o.   MRN: [id number] Patient enrolled for [employer] to ship a 30 day cardiac event monitor to his address on file. Sensitive skin electrodes with bridge requested. Also requested [employer] include a couple Hydrocolloid strips for patient to try.

## 2021-10-14 NOTE — Patient Instructions (Signed)
Medication Instructions:  Your physician recommends that you continue on your current medications as directed. Please refer to the Current Medication list given to you today.  *If you need a refill on your cardiac medications before your next appointment, please call your pharmacy*   Lab Work: Fasting lipids and ALT If you have labs (blood work) drawn today and your tests are completely normal, you will receive your results only by: [place] (if you have MyChart) OR A paper copy in the mail If you have any lab test that is abnormal or we need to change your treatment, we will call you to review the results.   Testing/Procedures: Your physician has recommended that you wear an event monitor. Event monitors are medical devices that record the hearts electrical activity. Doctors most often [place] these monitors to diagnose arrhythmias. Arrhythmias are problems with the speed or rhythm of the heartbeat. The monitor is a small, portable device. You can wear one while you do your normal daily activities. This is usually used to diagnose what is causing palpitations/syncope (passing out).  Follow-Up: At [facility], you and your health needs are our priority.  As part of our continuing mission to provide you with exceptional heart care, we have created designated Provider Care Teams.  These Care Teams include your primary Cardiologist (physician) and Advanced Practice Providers (APPs -  Physician Assistants and Nurse Practitioners) who all work together to provide you with the care you need, when you need it.  Your next appointment:   1 year(s)  The format for your next appointment:   In Person  Provider:   Fransico Him, MD     Other Instructions [employer] Cardiac Event Monitor Instructions Your physician has requested you wear your cardiac event monitor for _30_ days, (1-30). [employer] may call or text to confirm a shipping address. The monitor will be sent to a land address via  UPS. [employer] will not ship a monitor to a PO BOX. It typically takes 3-5 days to receive your monitor after it has been enrolled. [employer] will assist with USPS tracking if your package is delayed. The telephone number for [employer] is [phone]. Once you have received your monitor, please review the enclosed instructions. Instruction tutorials can also be viewed under help and settings on the enclosed cell phone. Your monitor has already been registered assigning a specific monitor serial # to you.  Applying the monitor Remove cell phone from case and turn it on. The cell phone works as Dealer and needs to be within [employer] of you at all times. The cell phone will need to be charged on a daily basis. We recommend you plug the cell phone into the enclosed charger at your bedside table every night.  Monitor batteries: You will receive two monitor batteries labelled #1 and #2. These are your recorders. Plug battery #2 onto the second connection on the enclosed charger. Keep one battery on the charger at all times. This will keep the monitor battery deactivated. It will also keep it fully charged for when you need to switch your monitor batteries. A small light will be blinking on the battery emblem when it is charging. The light on the battery emblem will remain on when the battery is fully charged.  Open package of a Monitor strip. Insert battery #1 into black hood on strip and gently squeeze monitor battery onto connection as indicated in instruction booklet. Set aside while preparing skin.  Choose location for your strip, vertical or  horizontal, as indicated in the instruction booklet. Shave to remove all hair from location. There cannot be any lotions, oils, powders, or colognes on skin where monitor is to be applied. Wipe skin clean with enclosed Saline wipe. Dry skin completely.  Peel paper labeled #1 off the back of the Monitor strip exposing the adhesive. Place  the monitor on the chest in the vertical or horizontal position shown in the instruction booklet. One arrow on the monitor strip must be pointing upward. Carefully remove paper labeled #2, attaching remainder of strip to your skin. Try not to create any folds or wrinkles in the strip as you apply it.  Firmly press and release the circle in the center of the monitor battery. You will hear a small beep. This is turning the monitor battery on. The heart emblem on the monitor battery will light up every 5 seconds if the monitor battery in turned on and connected to the patient securely. Do not push and hold the circle down as this turns the monitor battery off. The cell phone will locate the monitor battery. A screen will appear on the cell phone checking the connection of your monitor strip. This may read poor connection initially but change to good connection within the next minute. Once your monitor accepts the connection you will hear a series of 3 beeps followed by a climbing crescendo of beeps. A screen will appear on the cell phone showing the two monitor strip placement options. Touch the picture that demonstrates where you applied the monitor strip.  Your monitor strip and battery are waterproof. You are able to shower, bathe, or swim with the monitor on. They just ask you do not submerge deeper than 3 feet underwater. We recommend removing the monitor if you are swimming in a lake, river, or ocean.  Your monitor battery will need to be switched to a fully charged monitor battery approximately once a week. The cell phone will alert you of an action which needs to be made.  On the cell phone, tap for details to reveal connection status, monitor battery status, and cell phone battery status. The green dots indicates your monitor is in good status. A red dot indicates there is something that needs your attention.  To record a symptom, click the circle on the monitor battery. In 30-60  seconds a list of symptoms will appear on the cell phone. Select your symptom and tap save. Your monitor will record a sustained or significant arrhythmia regardless of you clicking the button. Some patients do not feel the heart rhythm irregularities. [employer] will notify us of any serious or critical events.  Refer to instruction booklet for instructions on switching batteries, changing strips, the Do not disturb or Pause features, or any additional questions.  Call [employer] at [phone], to confirm your monitor is transmitting and record your baseline. They will answer any questions you may have regarding the monitor instructions at that time.  Returning the monitor to [place] all equipment back into blue box. Peel off strip of paper to expose adhesive and close box securely. There is a prepaid UPS shipping label on this box. Drop in a UPS drop box, or at a UPS facility like [employer]. You may also contact [employer] to arrange UPS to pick up monitor package at your home.

## 2021-10-14 NOTE — Progress Notes (Signed)
Date:  10/14/2021   ID:  Jerry Rios, DOB Mar 16, 1951, MRN [id number]  PCP:  [name], MD  Cardiologist:   Electrophysiologist:  None   Chief Complaint:  Coronary artery calcium   History of Present Illness:    Jerry Rios is a 71 y.o. male with a history of CVA, HTN and hyperlipidemia.  He was seen for routine PE and his PCP ordered a coronary calcium score which was elevated at  684 with calcium noted in all 3 coronary arteries and LM.  He used to smoke for 10 years but quit at age 51.  His mom died of an MI at age 9 and his Dad died from cardiac arrest at 58.  He underwent coronary Ca score for risk assessment showing an elevated score of 684 and Lexiscan myoview showed no ischemia and normal LVF.  Repeat Leane Call 11/2020 showed no ischemia.   He is here today for followup and is doing well.  He denies any chest pain or pressure, SOB, DOE, PND, orthopnea, LE edema or syncope. Occasionally he will have some dizziness walking upstairs but this is stable.  He has noticed over the past year that he is having palpitations.  They are sporadic a few times a week lasting a few minutes at a time.  He drinks 2 cups of coffee and tea in the afternoon.  He is compliant with his meds and is tolerating meds with no SE.     Prior CV studies:   The following studies were reviewed today:  EKG performed today showed sinus bradyardia  Coronary calcium CT 10/2018 IMPRESSION: Coronary calcium score of 684. This was 76 nd percentile for age and sex matched control.   Consider f/u perfusion study given extent of calcium seen  Leane Call 02/2019 Study Highlights    Nuclear stress EF: 57%. There was no ST segment deviation noted during stress. Defect 1: There is a small defect of mild severity present in the apex location. No ischemia. This is a low risk study. (Known coronary calcification)    Past Medical History:  Diagnosis Date   Abdominal pain, unspecified site  05/30/2014   Allergic rhinitis 04/08/2007   Qualifier: Diagnosis of  By: [name] MD, [name]    Allergy    Arthritis    lumbar spine   Atherosclerosis    BACK PAIN, LUMBAR 07/10/2008   Qualifier: Diagnosis of  By: [name] MD, [place] PROSTATIC HYPERTROPHY 08/05/2007   Qualifier: Diagnosis of  By: [name] MD, [name]    BPH (benign prostatic hypertrophy)    Cataract    starting- mild    Cervical radiculopathy 08/01/2017   CKD (chronic kidney disease), stage III ([place])    COLONIC POLYPS, HX OF 08/05/2007   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    Depression 04/08/2007   Qualifier: Diagnosis of  By: [name] MD, [name]    Diverticulosis of colon    DIVERTICULOSIS, COLON 08/05/2007   Qualifier: Diagnosis of  By: [name] MD, [place], ATOPIC 11/12/2009   Qualifier: Diagnosis of  By: [name] MD, Arvid Right.    HEMORRHOIDS 07/10/2008   Qualifier: Diagnosis of  By: [name] MD, [name]    History of nephrolithiasis    History of shingles    Hx of colonic polyp  Hyperglycemia 11/19/2014   Hyperlipidemia    Hypertension    some past history    Insomnia    Insomnia, unspecified 04/08/2007   Centricity Description: SYMPTOM, INSOMNIA NOS Qualifier: Diagnosis of  By: [name] MD, [name]  Centricity Description: INSOMNIA-SLEEP DISORDER-UNSPEC Qualifier: Diagnosis of  By: [name] MD, [name]    KNEE PAIN, LEFT 07/10/2008   Qualifier: Diagnosis of  By: [name] MD, [name]    Libido, decreased 10/04/2011   Loss of weight 05/30/2014   Multinodular goiter 12/14/2016   Nausea in adult patient 05/30/2014   NEPHROLITHIASIS, HX OF 08/05/2007   Qualifier: Diagnosis of  By: [name] MD, [name]    RENAL INSUFFICIENCY 09/07/2010   Qualifier: Diagnosis of  By: [name] MD, [name]    Right leg pain 10/04/2018   Stroke ([place]) 10/2014   TIA (transient ischemic attack)    Vitiligo 10/31/2012   Past Surgical History:  Procedure Laterality Date    CERVICAL DISC ARTHROPLASTY  12/08/2017   COLONOSCOPY     CYSTOSCOPY WITH URETEROSCOPY AND STENT PLACEMENT Left 07/04/2014   Procedure: CYSTOSCOPY WITH LEFT RETROGRADE PYLEOGRAM, LEFT URETERAL DILATION AND URETEROSCOPY, LASER LITHOTRISPY AND STENT PLACEMENT;  Surgeon: [name], MD;  Location: [facility];  Service: Urology;  Laterality: Left;   HOLMIUM LASER APPLICATION N/A 56/12/3327   Procedure:  LASER LITHOTRIPSY;  Surgeon: [name], MD;  Location: [facility];  Service: Urology;  Laterality: N/A;   lithotrypsy     x 3   POLYPECTOMY     TONSILLECTOMY       Current Meds  Medication Sig   B Complex-C (SUPER B COMPLEX/VITAMIN C) TABS Take 1 tablet by mouth daily.   Barberry-Oreg Grape-Goldenseal (BERBERINE COMPLEX) 200-200-50 MG CAPS Take 1 capsule by mouth daily as needed.   bisacodyl (DULCOLAX) 5 MG EC tablet Take 5 mg by mouth daily as needed.   cetirizine (ZYRTEC) 10 MG tablet Take 10 mg by mouth daily as needed for allergies.   co-enzyme Q-10 30 MG capsule Take 100 mg by mouth as needed.   docusate sodium (COLACE) 100 MG capsule Take 100 mg by mouth daily as needed for mild constipation.   fluticasone (FLONASE) 50 MCG/ACT nasal spray USE 2 SPRAYS IN EACH  NOSTRIL DAILY   Magnesium 250 MG TABS Take 1 tablet by mouth daily.   Menaquinone-7 (VITAMIN K2) 100 MCG CAPS Take 1 capsule by mouth as needed.   metroNIDAZOLE (METROGEL) 1 % gel APPLY TO AFFECTED AREA(S)  TOPICALLY DAILY AS NEEDED  FOR ROSACEA   Naphazoline-Pheniramine (OPCON-A OP) Apply 1 drop to eye as needed.   rosuvastatin (CRESTOR) 10 MG tablet TAKE 1 TABLET(10 MG) BY MOUTH DAILY   Turmeric 500 MG CAPS Take 1 capsule by mouth daily.   Vitamin D, Cholecalciferol, 25 MCG (1000 UT) TABS Take 1 capsule by mouth daily.     Allergies:   Adhesive [tape], Crestor [rosuvastatin], Lipitor [atorvastatin], Nexlizet [bempedoic acid-ezetimibe], Praluent [alirocumab], Repatha [evolocumab], and Zocor [simvastatin]   Social History    Tobacco Use   Smoking status: Former    Packs/day: 0.50    Years: 10.00    Pack years: 5.00    Types: Cigarettes   Smokeless tobacco: Never   Tobacco comments:    Regular exercise - No  Substance Use Topics   Alcohol use: Not Currently    Alcohol/week: 7.0 standard drinks    Types: 7 Cans of beer per week   Drug use: No  Family Hx: The patient's family history includes CAD in his father and mother; Colitis in his sister; Heart disease in an other family member; Heart failure in his father, maternal grandfather, and mother; Hyperlipidemia in an other family member; Hypertension in an other family member; Lupus in his paternal grandfather; Ulcerative colitis in his sister. There is no history of Colon cancer, Esophageal cancer, Pancreatic cancer, Stomach cancer, Liver disease, Colon polyps, Rectal cancer, or Diabetes.  ROS:   Please see the history of present illness.     All other systems reviewed and are negative.   Labs/Other Tests and Data Reviewed:    Recent Labs: 10/27/2020: ALT 26; BUN 23; Creatinine, Ser 1.46; Hemoglobin 13.1; Platelets 211.0; Potassium 3.9; Sodium 138   Recent Lipid Panel Lab Results  Component Value Date/Time   CHOL 174 10/27/2020 07:50 AM   CHOL 168 03/23/2020 12:00 AM   TRIG 38.0 10/27/2020 07:50 AM   HDL 98.00 10/27/2020 07:50 AM   HDL 91 03/23/2020 12:00 AM   CHOLHDL 2 10/27/2020 07:50 AM   LDLCALC 68 10/27/2020 07:50 AM   LDLCALC 69 03/23/2020 12:00 AM   LDLDIRECT 206.1 11/01/2013 09:42 AM    Wt Readings from Last 3 Encounters:  10/14/21 134 lb (60.8 kg)  03/03/21 136 lb 3.2 oz (61.8 kg)  12/22/20 141 lb (64 kg)     Objective:    Vital Signs:  BP (!) 94/56    Pulse 62    Ht 5\' 7"  (1.702 m)    Wt 134 lb (60.8 kg)    SpO2 98%    BMI 20.99 kg/m    GEN: Well nourished, well developed in no acute distress HEENT: Normal NECK: No JVD; No carotid bruits LYMPHATICS: No lymphadenopathy CARDIAC:RRR, no murmurs, rubs,  gallops RESPIRATORY:  Clear to auscultation without rales, wheezing or rhonchi  ABDOMEN: Soft, non-tender, non-distended MUSCULOSKELETAL:  No edema; No deformity  SKIN: Warm and dry NEUROLOGIC:  Alert and oriented x 3 PSYCHIATRIC:  Normal affect    EKG was performed in the office today and showed sinus bradycardia  ASSESSMENT & PLAN:    1.  Coronary artery calcifications  - coronary calcium was noted in all 3 coronary arteries including left main.   -Calcium score high at > 600.   -nuclear stress test 02/2019 showed no ischemia -Lexiscan Myoview on 12/22/2020 showed no ischemia -He denies any anginal chest pain -Continue prescription drug management with Crestor 10 mg daily -he is intolerant to ASA due to gastritis  2.  Hyperlipidemia  -LDL goal is less than 70 -Check FLP and ALT -he is intolerant to PCSK9i, statins and doses > 10mg  of Crestor daily -Continue prescription drug management with Crestor 10 mg daily with as needed refills  3.  Hypertension  -His BP is adequately controlled on exam today. -he is currently on no antihypertensive therapy  4.  Palpitations -I will get a 30 day event monitor to assess further  Medication Adjustments/Labs and Tests Ordered: Current medicines are reviewed at length with the patient today.  Concerns regarding medicines are outlined above.  Tests Ordered: Orders Placed This Encounter  Procedures   EKG 12-Lead   Medication Changes: No orders of the defined types were placed in this encounter.   Disposition:  Follow up in 1 year(s)  Signed, Fransico Him, MD  10/14/2021 8:32 AM    [facility]

## 2021-10-14 NOTE — Addendum Note (Signed)
Addended by: Antonieta Iba on: 10/14/2021 08:43 AM   Modules accepted: Orders

## 2021-10-20 ENCOUNTER — Telehealth: Payer: Self-pay | Admitting: Internal Medicine

## 2021-10-20 NOTE — Telephone Encounter (Signed)
Jerry Rios requesting office notes/ referral to be sent over to their office, as pt is having provider look at his lip lesion.   Fax #- [phone]

## 2021-10-20 NOTE — Telephone Encounter (Signed)
Please clarify - referral to whom?

## 2021-10-21 ENCOUNTER — Other Ambulatory Visit: Payer: Self-pay

## 2021-10-21 VITALS — BP 122/60 | HR 60

## 2021-10-21 DIAGNOSIS — Z006 Encounter for examination for normal comparison and control in clinical research program: Secondary | ICD-10-CM

## 2021-10-21 NOTE — Progress Notes (Signed)
Patient seen multiple times during visit.  Developed lower BP and P after developing brief abdomen discomfort after Sub Q injection as part of protocol.  BP and P came back up and the patient feels fine now.  He was monitored for more than an hour, and breakfast, which he had delayed, was provided to him.  He continued to feel well with normalization of vitals.  Monitored throughout by Research officer, political party, and ambulated significantly by Berneda Rose our research manager who also assessed patient.  No symptoms at present and no dizziness.  Wants to go home as he is late for work (works from home).  We offered to drive him home and bring him back, but he has an [place] car and does not want to leave it.  He seems fine, and review of chart shows similar change in vitals during a previous GI visit.    Lungs clear, cardiac sounds normal.  Skin turgor good.  Not orthostatic.  Injection site without induration mid abdomen.    ECG shows sinus bradycardia, no acute changes.    He will call as soon as he arrives home.   Loretha Brasil. Lia Foyer, MD, [facility], [place] Director, [facility].

## 2021-10-21 NOTE — Telephone Encounter (Signed)
Ok to fax last visit I think where this was noticed as an issue

## 2021-10-21 NOTE — Research (Cosign Needed)
Patient seen in the clinic for Month 9 visit for ORION 4 trial. He denies any recent hospitalizations, no Aes/SAEs, nor medication changes. Labs drawn at 0835 without any complications noted. IP injection was given in the RUQ. Immediately after injection given patient complained of stomach pain and stated he was feeling dizzy, nauseous and having a little blurred vision. Patient was in the sitting position when injection given. Transferred patient to recliner and lied patient back. Blood pressure checked at 0852 79/42, HR 43, O2 100%. Dr Lia Foyer, PI, notified of situation and coming to see patient.  Blood pressure readings taken at different intervals below: 0858 100/47  HR 47 O2 100% 0901 105/56  HR 46 O2 100% BS 110  0906 ECG was performed and reviewed by PI. Normal sinus bradycardia Orthostatics Lying: 115/67  HR 47 @ 0912 Sitting: 119/54 HR 46 @ 0913 Standing: 114/61 HR 55 @0914   Blood pressure rechecked after talking with Dr Lia Foyer at [phone] 130/87 HR 47 Patient given food to eat since he had been fasting then walked the hall for approximately 5 minutes at 1015 with Berneda Rose, RN.Denied any dizziness, and lightheadedness. States he feels a lot better. All pain and earlier issues  have resolved. Blood pressure at 1020 Right arm: 137/50 HR 60 Left arm: 122/60 HR 60 At 1030 Dr Lia Foyer back in to evaluate patient. Patient was given the okay to drive home but to call when he gets home. 1058  Patient did call and stated he arrived home safely.  His next appointment for month 15 is 04/19/2022 @ 0830

## 2021-10-25 ENCOUNTER — Encounter: Payer: Self-pay | Admitting: Internal Medicine

## 2021-10-25 ENCOUNTER — Other Ambulatory Visit: Payer: Self-pay

## 2021-10-25 ENCOUNTER — Ambulatory Visit (INDEPENDENT_AMBULATORY_CARE_PROVIDER_SITE_OTHER): Payer: Medicare Other | Admitting: Internal Medicine

## 2021-10-25 ENCOUNTER — Other Ambulatory Visit: Payer: Self-pay | Admitting: Cardiology

## 2021-10-25 ENCOUNTER — Ambulatory Visit (INDEPENDENT_AMBULATORY_CARE_PROVIDER_SITE_OTHER): Payer: Medicare Other

## 2021-10-25 VITALS — BP 120/62 | HR 67 | Temp 98.5°F | Ht 67.0 in | Wt 138.0 lb

## 2021-10-25 DIAGNOSIS — R739 Hyperglycemia, unspecified: Secondary | ICD-10-CM

## 2021-10-25 DIAGNOSIS — R002 Palpitations: Secondary | ICD-10-CM | POA: Diagnosis not present

## 2021-10-25 DIAGNOSIS — I10 Essential (primary) hypertension: Secondary | ICD-10-CM | POA: Diagnosis not present

## 2021-10-25 DIAGNOSIS — R42 Dizziness and giddiness: Secondary | ICD-10-CM

## 2021-10-25 DIAGNOSIS — E538 Deficiency of other specified B group vitamins: Secondary | ICD-10-CM

## 2021-10-25 DIAGNOSIS — E7800 Pure hypercholesterolemia, unspecified: Secondary | ICD-10-CM

## 2021-10-25 DIAGNOSIS — I2510 Atherosclerotic heart disease of native coronary artery without angina pectoris: Secondary | ICD-10-CM | POA: Diagnosis not present

## 2021-10-25 DIAGNOSIS — E049 Nontoxic goiter, unspecified: Secondary | ICD-10-CM

## 2021-10-25 DIAGNOSIS — E559 Vitamin D deficiency, unspecified: Secondary | ICD-10-CM

## 2021-10-25 DIAGNOSIS — N1831 Chronic kidney disease, stage 3a: Secondary | ICD-10-CM

## 2021-10-25 DIAGNOSIS — N320 Bladder-neck obstruction: Secondary | ICD-10-CM

## 2021-10-25 NOTE — Patient Instructions (Signed)

## 2021-10-25 NOTE — Progress Notes (Signed)
Patient ID: Jerry Rios, male   DOB: [birth year], 71 y.o.   MRN: [id number]        Chief Complaint: follow up HTN, hld, ckd, left lumbar radiculopathy       HPI:  Jerry Rios is a 71 y.o. male here overal doing ok, Pt denies chest pain, increased sob or doe, wheezing, orthopnea, PND, increased LE swelling, dizziness or syncope.   Pt denies polydipsia, polyuria, or new focal neuro s/s.   Pt denies fever, wt loss, night sweats, loss of appetite, or other constitutional symptoms  also has recent palpitations now on 30 day event monitor per cardiology.  Has known left lumbar radiculpathy with numb toes chronic.  Now in clinical trial for lipid treatment  Denies hyper or hypo thyroid symptoms such as voice, skin or hair change. Wt Readings from Last 3 Encounters:  10/25/21 138 lb (62.6 kg)  10/14/21 134 lb (60.8 kg)  03/03/21 136 lb 3.2 oz (61.8 kg)   BP Readings from Last 3 Encounters:  10/25/21 120/62  10/21/21 122/60  10/14/21 (!) 94/56         Past Medical History:  Diagnosis Date   Abdominal pain, unspecified site 05/30/2014   Allergic rhinitis 04/08/2007   Qualifier: Diagnosis of  By: [name] MD, [name]    Allergy    Arthritis    lumbar spine   Atherosclerosis    BACK PAIN, LUMBAR 07/10/2008   Qualifier: Diagnosis of  By: [name] MD, [place] HYPERTROPHY 08/05/2007   Qualifier: Diagnosis of  By: [name] MD, [name]    BPH (benign prostatic hypertrophy)    Cataract    starting- mild    Cervical radiculopathy 08/01/2017   CKD (chronic kidney disease), stage III ([place])    COLONIC POLYPS, HX OF 08/05/2007   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    Depression 04/08/2007   Qualifier: Diagnosis of  By: [name] MD, [name]    Diverticulosis of colon    DIVERTICULOSIS, COLON 08/05/2007   Qualifier: Diagnosis of  By: [name] MD, [place], ATOPIC 11/12/2009   Qualifier: Diagnosis  of  By: [name] MD, Arvid Right.    HEMORRHOIDS 07/10/2008   Qualifier: Diagnosis of  By: [name] MD, [name]    History of nephrolithiasis    History of shingles    Hx of colonic polyp    Hyperglycemia 11/19/2014   Hyperlipidemia    Hypertension    some past history    Insomnia    Insomnia, unspecified 04/08/2007   Centricity Description: SYMPTOM, INSOMNIA NOS Qualifier: Diagnosis of  By: [name] MD, [name]  Centricity Description: INSOMNIA-SLEEP DISORDER-UNSPEC Qualifier: Diagnosis of  By: [name] MD, [name]    KNEE PAIN, LEFT 07/10/2008   Qualifier: Diagnosis of  By: [name] MD, [name]    Libido, decreased 10/04/2011   Loss of weight 05/30/2014   Multinodular goiter 12/14/2016   Nausea in adult patient 05/30/2014   NEPHROLITHIASIS, HX OF 08/05/2007   Qualifier: Diagnosis of  By: [name] MD, [name]    RENAL INSUFFICIENCY 09/07/2010   Qualifier: Diagnosis of  By: [name] MD, [name]    Right leg pain 10/04/2018   Stroke ([place]) 10/2014   TIA (transient ischemic attack)    Vitiligo 10/31/2012   Past Surgical History:  Procedure Laterality Date  CERVICAL DISC ARTHROPLASTY  12/08/2017   COLONOSCOPY     CYSTOSCOPY WITH URETEROSCOPY AND STENT PLACEMENT Left 07/04/2014   Procedure: CYSTOSCOPY WITH LEFT RETROGRADE PYLEOGRAM, LEFT URETERAL DILATION AND URETEROSCOPY, LASER LITHOTRISPY AND STENT PLACEMENT;  Surgeon: [name], MD;  Location: [facility];  Service: Urology;  Laterality: Left;   HOLMIUM LASER APPLICATION N/A 05/01/7618   Procedure:  LASER LITHOTRIPSY;  Surgeon: [name], MD;  Location: [facility];  Service: Urology;  Laterality: N/A;   lithotrypsy     x 3   POLYPECTOMY     TONSILLECTOMY      reports that he has quit smoking. His smoking use included cigarettes. He has a 5.00 pack-year smoking history. He has never used smokeless tobacco. He reports that he does not currently use alcohol after a past usage of about 7.0 standard drinks per week. He reports that he does not use drugs. family  history includes CAD in his father and mother; Colitis in his sister; Heart disease in an other family member; Heart failure in his father, maternal grandfather, and mother; Hyperlipidemia in an other family member; Hypertension in an other family member; Lupus in his paternal grandfather; Ulcerative colitis in his sister. Allergies  Allergen Reactions   Adhesive [Tape] Rash   Crestor [Rosuvastatin]     REACTION: myalgia   Lipitor [Atorvastatin]     REACTION: myalgia   Nexlizet [Bempedoic Acid-Ezetimibe] Itching    Myalgia in fingers, hands, feet   Praluent [Alirocumab]     ineffective   Repatha [Evolocumab]     ineffective   Zocor [Simvastatin]     REACTION: myalgia   Current Outpatient Medications on File Prior to Visit  Medication Sig Dispense Refill   B Complex-C (SUPER B COMPLEX/VITAMIN C) TABS Take 1 tablet by mouth daily.     Barberry-Oreg Grape-Goldenseal (BERBERINE COMPLEX) 200-200-50 MG CAPS Take 1 capsule by mouth daily as needed.     bisacodyl (DULCOLAX) 5 MG EC tablet Take 5 mg by mouth daily as needed.     cetirizine (ZYRTEC) 10 MG tablet Take 10 mg by mouth daily as needed for allergies.     co-enzyme Q-10 30 MG capsule Take 100 mg by mouth as needed.     docusate sodium (COLACE) 100 MG capsule Take 100 mg by mouth daily as needed for mild constipation.     fluticasone (FLONASE) 50 MCG/ACT nasal spray USE 2 SPRAYS IN EACH  NOSTRIL DAILY 48 g 1   Magnesium 250 MG TABS Take 1 tablet by mouth daily.     Menaquinone-7 (VITAMIN K2) 100 MCG CAPS Take 1 capsule by mouth as needed.     metroNIDAZOLE (METROGEL) 1 % gel APPLY TO AFFECTED AREA(S)  TOPICALLY DAILY AS NEEDED  FOR ROSACEA 120 g 1   Naphazoline-Pheniramine (OPCON-A OP) Apply 1 drop to eye as needed.     Turmeric 500 MG CAPS Take 1 capsule by mouth daily.     Vitamin D, Cholecalciferol, 25 MCG (1000 UT) TABS Take 1 capsule by mouth daily.     No current facility-administered medications on file prior to visit.         ROS:  All others reviewed and negative.  Objective        PE:  BP 120/62 (BP Location: Left Arm, Patient Position: Sitting, Cuff Size: Large)    Pulse 67    Temp 98.5 F (36.9 C) (Oral)    Ht 5\' 7"  (1.702 m)    Wt 138 lb (  62.6 kg)    SpO2 98%    BMI 21.61 kg/m                 Constitutional: Pt appears in NAD               HENT: Head: NCAT.                Right Ear: External ear normal.                 Left Ear: External ear normal.                Eyes: . Pupils are equal, round, and reactive to light. Conjunctivae and EOM are normal               Nose: without d/c or deformity               Neck: Neck supple. Gross normal ROM               Cardiovascular: Normal rate and regular rhythm.                 Pulmonary/Chest: Effort normal and breath sounds without rales or wheezing.                Abd:  Soft, NT, ND, + BS, no organomegaly               Neurological: Pt is alert. At baseline orientation, motor grossly intact               Skin: Skin is warm. No rashes, no other new lesions, LE edema - none               Psychiatric: Pt behavior is normal without agitation   Micro: none  Cardiac tracings I have personally interpreted today:  none  Pertinent Radiological findings (summarize): none   Lab Results  Component Value Date   WBC 4.8 10/27/2020   HGB 13.1 10/27/2020   HCT 38.0 (L) 10/27/2020   PLT 211.0 10/27/2020   GLUCOSE 88 10/27/2020   CHOL 174 10/27/2020   TRIG 38.0 10/27/2020   HDL 98.00 10/27/2020   LDLDIRECT 206.1 11/01/2013   LDLCALC 68 10/27/2020   ALT 26 10/27/2020   AST 29 10/27/2020   NA 138 10/27/2020   K 3.9 10/27/2020   CL 100 10/27/2020   CREATININE 1.46 10/27/2020   BUN 23 10/27/2020   CO2 28 10/27/2020   TSH 1.52 10/03/2019   PSA 1.66 10/03/2019   HGBA1C 5.7 10/27/2020   Assessment/Plan:  Jerry Rios is a 71 y.o. White or Caucasian [1] male with  has a past medical history of Abdominal pain, unspecified site (05/30/2014), Allergic rhinitis  (04/08/2007), Allergy, Arthritis, Atherosclerosis, BACK PAIN, LUMBAR (07/10/2008), BENIGN PROSTATIC HYPERTROPHY (08/05/2007), BPH (benign prostatic hypertrophy), Cataract, Cervical radiculopathy (08/01/2017), CKD (chronic kidney disease), stage III ([place]), COLONIC POLYPS, HX OF (08/05/2007), Cough (11/12/2009), Depression (04/08/2007), Diverticulosis of colon, DIVERTICULOSIS, COLON (08/05/2007), [place], ATOPIC (11/12/2009), HEMORRHOIDS (07/10/2008), History of nephrolithiasis, History of shingles, colonic polyp, Hyperglycemia (11/19/2014), Hyperlipidemia, Hypertension, Insomnia, Insomnia, unspecified (04/08/2007), KNEE PAIN, LEFT (07/10/2008), Libido, decreased (10/04/2011), Loss of weight (05/30/2014), Multinodular goiter (12/14/2016), Nausea in adult patient (05/30/2014), NEPHROLITHIASIS, HX OF (08/05/2007), RENAL INSUFFICIENCY (09/07/2010), Right leg pain (10/04/2018), Stroke ([place]) (10/2014), TIA (transient ischemic attack), and Vitiligo (10/31/2012).  Enlarged thyroid For f/u TFTs, asympt,  to f/u any worsening symptoms or concerns  CKD (chronic kidney disease) stage 3, GFR 30-59 ml/min Lab Results  Component Value Date   CREATININE 1.46 10/27/2020   Stable overall, cont to avoid nephrotoxins   Hyperglycemia Lab Results  Component Value Date   HGBA1C 5.7 10/27/2020   Stable, pt to continue current medical treatment - diet   Hyperlipidemia Pt now involved in clinical trial, cont low chol diet Lab Results  Component Value Date   LDLCALC 68 10/27/2020    Followup: Return in about 6 months (around 04/24/2022).  Cathlean Cower, MD 10/31/2021 8:28 PM [facility]

## 2021-10-26 ENCOUNTER — Telehealth: Payer: Self-pay | Admitting: Internal Medicine

## 2021-10-26 NOTE — Telephone Encounter (Signed)
Dr. Sheran Fava office called and said that this patient has an appointment today.  They need a referral sent over for this visit.  It is at 3:30pm.  The referral can be faxed to [phone].

## 2021-10-26 NOTE — Telephone Encounter (Signed)
Office notes have been faxed.

## 2021-10-26 NOTE — Telephone Encounter (Signed)
Office notes have been faxed to office

## 2021-10-26 NOTE — Telephone Encounter (Signed)
Sorry, I am not able since I would need the specialty to refer to , and ideally the reason as well, thanks

## 2021-10-28 ENCOUNTER — Telehealth: Payer: Self-pay | Admitting: *Deleted

## 2021-10-28 ENCOUNTER — Encounter: Payer: Self-pay | Admitting: Cardiology

## 2021-10-28 NOTE — Telephone Encounter (Signed)
Discussed what supplies the patient had available to him. He has had a reaction to the bridge with "Stable" electrodes. He has also been sent 6M Red dot #[id number] prepositional electrodes which he has not tried yet.  He also was sent the Hydrocolloid strips, which he has not tried yet. Reviewed alternative placement sites. I will leave an envelope at the front dest with a couple packets of Skin Tac Wipes( adhesive barrier) to try.  I will also provide an addition x5 packet of 6M # [id number]. Also explained to patient that if need be, he could take breaks between applications to allow the skin to heal.

## 2021-10-28 NOTE — Telephone Encounter (Signed)
LMVM- Please call Kayin Kettering in monitors at [phone] to discuss what type of sensitive skin electrodes [employer] had sent to you. I have a couple different alternatives at the office that you could use with a bridge, or we also have Hydrocolloid strips that are typically used with adolescents, which have less adhesive.

## 2021-10-31 ENCOUNTER — Encounter: Payer: Self-pay | Admitting: Internal Medicine

## 2021-10-31 NOTE — Assessment & Plan Note (Signed)
For f/u TFTs, asympt,  to f/u any worsening symptoms or concerns

## 2021-10-31 NOTE — Assessment & Plan Note (Signed)
Lab Results  Component Value Date   CREATININE 1.46 10/27/2020   Stable overall, cont to avoid nephrotoxins

## 2021-10-31 NOTE — Assessment & Plan Note (Signed)
Pt now involved in clinical trial, cont low chol diet Lab Results  Component Value Date   LDLCALC 68 10/27/2020

## 2021-10-31 NOTE — Assessment & Plan Note (Signed)
Lab Results  Component Value Date   HGBA1C 5.7 10/27/2020   Stable, pt to continue current medical treatment - diet

## 2021-11-03 ENCOUNTER — Encounter: Payer: Self-pay | Admitting: Internal Medicine

## 2021-11-03 DIAGNOSIS — M542 Cervicalgia: Secondary | ICD-10-CM

## 2021-11-04 ENCOUNTER — Other Ambulatory Visit (INDEPENDENT_AMBULATORY_CARE_PROVIDER_SITE_OTHER): Payer: Medicare Other

## 2021-11-04 DIAGNOSIS — E559 Vitamin D deficiency, unspecified: Secondary | ICD-10-CM

## 2021-11-04 DIAGNOSIS — R739 Hyperglycemia, unspecified: Secondary | ICD-10-CM

## 2021-11-04 DIAGNOSIS — E049 Nontoxic goiter, unspecified: Secondary | ICD-10-CM

## 2021-11-04 DIAGNOSIS — E7800 Pure hypercholesterolemia, unspecified: Secondary | ICD-10-CM | POA: Diagnosis not present

## 2021-11-04 DIAGNOSIS — E538 Deficiency of other specified B group vitamins: Secondary | ICD-10-CM | POA: Diagnosis not present

## 2021-11-04 DIAGNOSIS — N320 Bladder-neck obstruction: Secondary | ICD-10-CM

## 2021-11-04 LAB — BASIC METABOLIC PANEL
BUN: 19 mg/dL (ref 6–23)
CO2: 33 mEq/L — ABNORMAL HIGH (ref 19–32)
Calcium: 9.9 mg/dL (ref 8.4–10.5)
Chloride: 102 mEq/L (ref 96–112)
Creatinine, Ser: 1.37 mg/dL (ref 0.40–1.50)
GFR: 52.01 mL/min — ABNORMAL LOW (ref >60.00)
Glucose, Bld: 94 mg/dL (ref 70–99)
Potassium: 4 mEq/L (ref 3.5–5.1)
Sodium: 140 mEq/L (ref 135–145)

## 2021-11-04 LAB — CBC WITH DIFFERENTIAL/PLATELET
Basophils Absolute: 0 10*3/uL (ref 0.0–0.1)
Basophils Relative: 0.8 % (ref 0.0–3.0)
Eosinophils Absolute: 0.1 10*3/uL (ref 0.0–0.7)
Eosinophils Relative: 2.9 % (ref 0.0–5.0)
HCT: 41.5 % (ref 39.0–52.0)
Hemoglobin: 13.9 g/dL (ref 13.0–17.0)
Lymphocytes Relative: 32.2 % (ref 12.0–46.0)
Lymphs Abs: 1.6 10*3/uL (ref 0.7–4.0)
MCHC: 33.5 g/dL (ref 30.0–36.0)
MCV: 91.9 fl (ref 78.0–100.0)
Monocytes Absolute: 0.5 10*3/uL (ref 0.1–1.0)
Monocytes Relative: 9.6 % (ref 3.0–12.0)
Neutro Abs: 2.7 10*3/uL (ref 1.4–7.7)
Neutrophils Relative %: 54.5 % (ref 43.0–77.0)
Platelets: 218 10*3/uL (ref 150.0–400.0)
RBC: 4.51 Mil/uL (ref 4.22–5.81)
RDW: 14.1 % (ref 11.5–15.5)
WBC: 5 10*3/uL (ref 4.0–10.5)

## 2021-11-04 LAB — URINALYSIS, ROUTINE W REFLEX MICROSCOPIC
Bilirubin Urine: NEGATIVE
Hgb urine dipstick: NEGATIVE
Ketones, ur: NEGATIVE
Leukocytes,Ua: NEGATIVE
Nitrite: NEGATIVE
RBC / HPF: NONE SEEN (ref 0–?)
Specific Gravity, Urine: <=1.005 — AB (ref 1.000–1.030)
Total Protein, Urine: NEGATIVE
Urine Glucose: NEGATIVE
Urobilinogen, UA: 0.2 (ref 0.0–1.0)
WBC, UA: NONE SEEN (ref 0–?)
pH: 6 (ref 5.0–8.0)

## 2021-11-04 LAB — HEPATIC FUNCTION PANEL
ALT: 21 U/L (ref 0–53)
AST: 23 U/L (ref 0–37)
Albumin: 4.3 g/dL (ref 3.5–5.2)
Alkaline Phosphatase: 33 U/L — ABNORMAL LOW (ref 39–117)
Bilirubin, Direct: 0.1 mg/dL (ref 0.0–0.3)
Total Bilirubin: 0.6 mg/dL (ref 0.2–1.2)
Total Protein: 6.9 g/dL (ref 6.0–8.3)

## 2021-11-04 LAB — TSH: TSH: 1.47 u[IU]/mL (ref 0.35–5.50)

## 2021-11-04 LAB — VITAMIN D 25 HYDROXY (VIT D DEFICIENCY, FRACTURES): VITD: 42.61 ng/mL (ref 30.00–100.00)

## 2021-11-04 LAB — HEMOGLOBIN A1C: Hgb A1c MFr Bld: 5.7 % (ref 4.6–6.5)

## 2021-11-04 LAB — VITAMIN B12: Vitamin B-12: 732 pg/mL (ref 211–911)

## 2021-11-04 LAB — T4, FREE: Free T4: 0.71 ng/dL (ref 0.60–1.60)

## 2021-11-04 LAB — PSA: PSA: 1.21 ng/mL (ref 0.10–4.00)

## 2021-11-15 ENCOUNTER — Encounter: Payer: Self-pay | Admitting: Cardiology

## 2021-11-22 IMAGING — US US ABDOMEN COMPLETE
1 series · 14 of 25 positions shown · non-contrast
Comparison: MRI abdomen 03/06/2019

CLINICAL DATA: Abdominal pain.  Epigastric pain and nausea.

EXAM:
ABDOMEN ULTRASOUND COMPLETE

[Series 1: us abdomen complete · 14 of 107 slices shown]
[im 1/107]
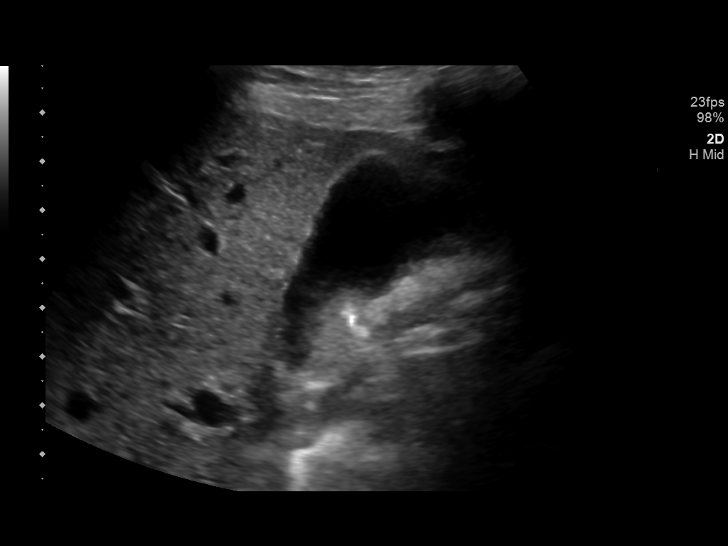
[im 9/107]
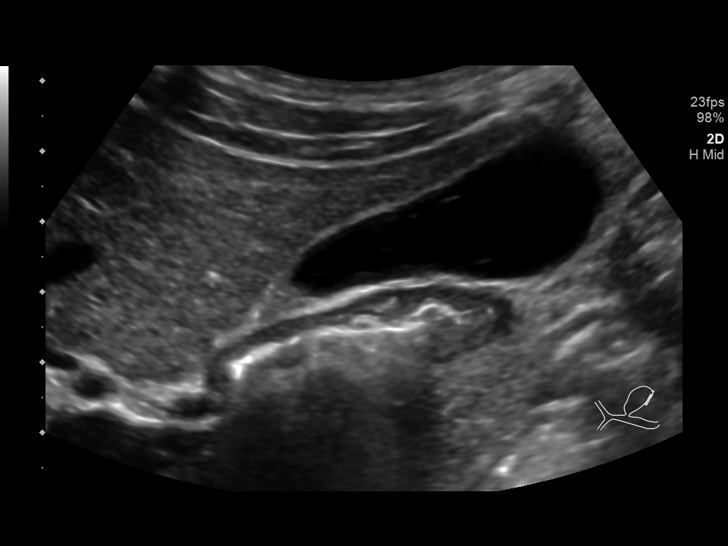
[im 18/107]
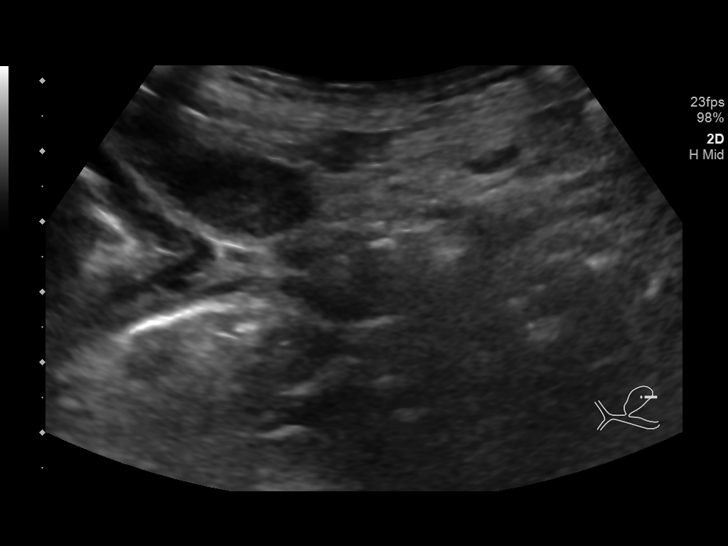
[im 27/107]
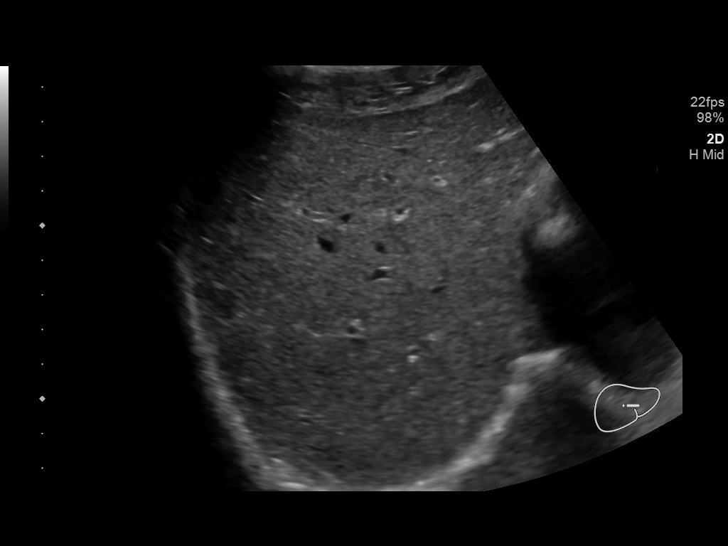
[im 36/107]
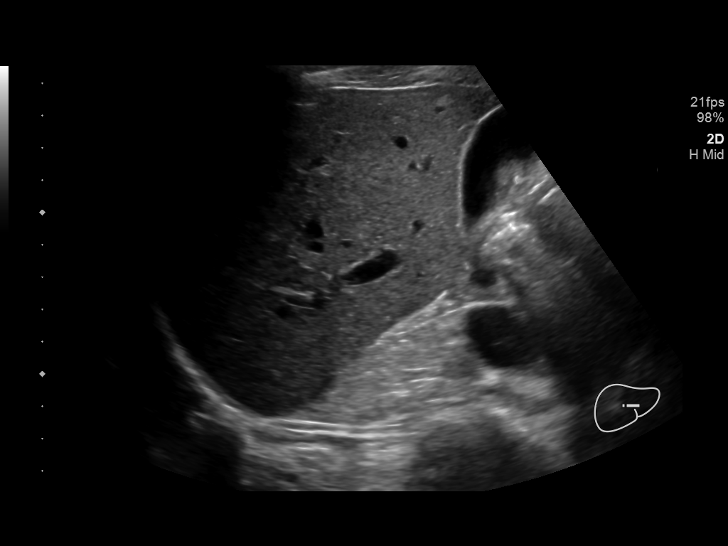
[im 40/107]
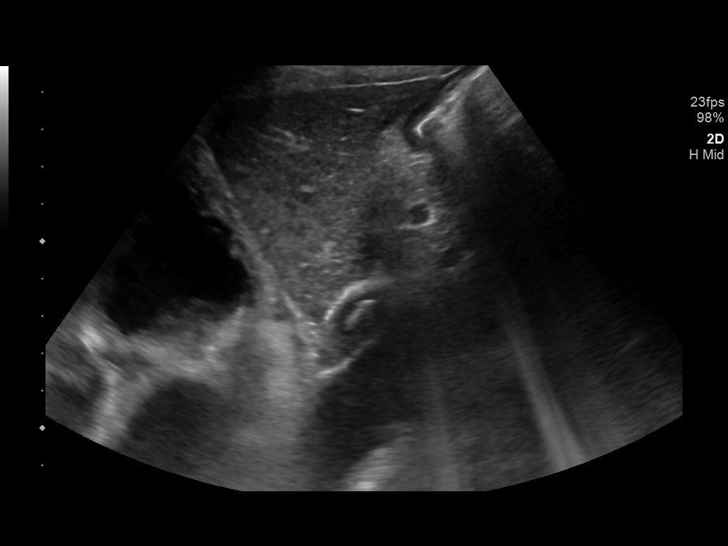
[im 49/107]
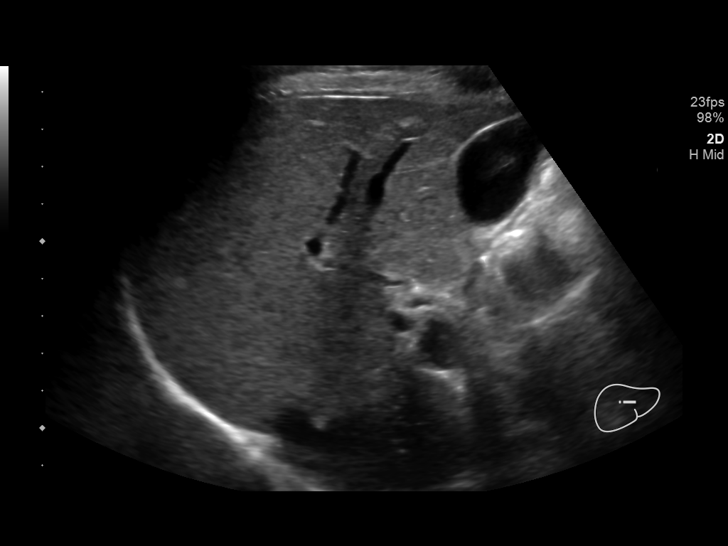
[im 58/107]
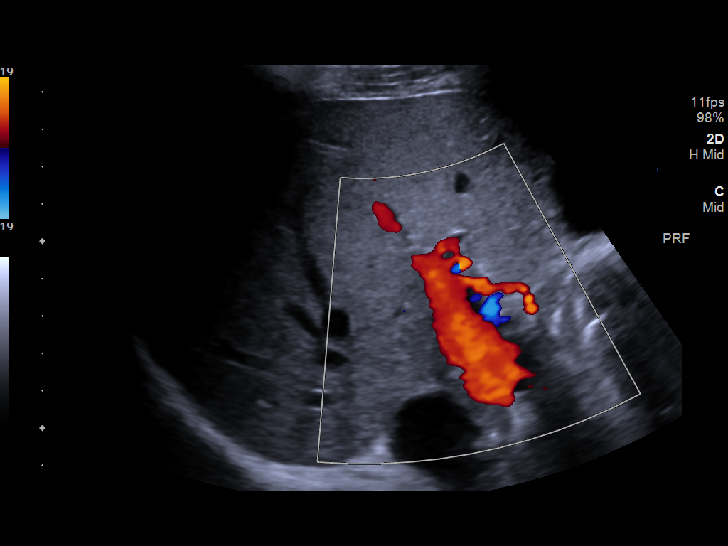
[im 67/107]
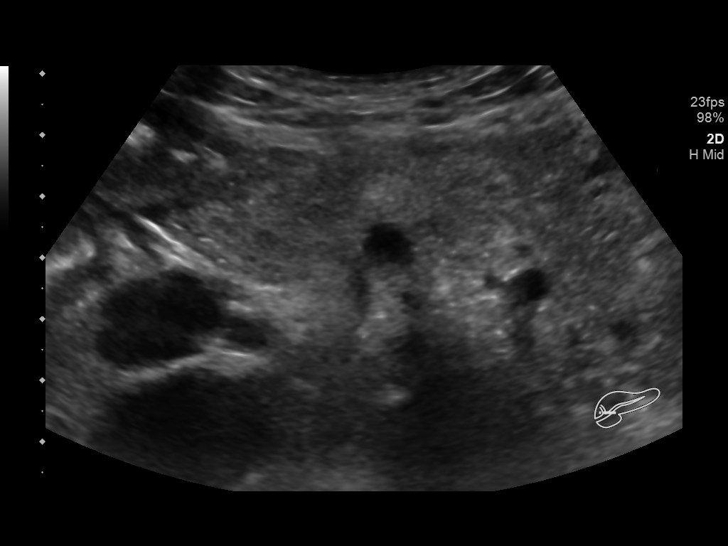
[im 71/107]
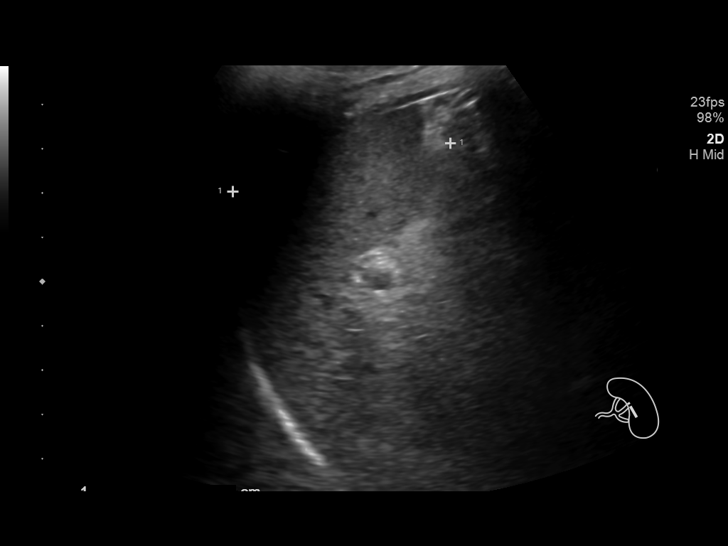
[im 80/107]
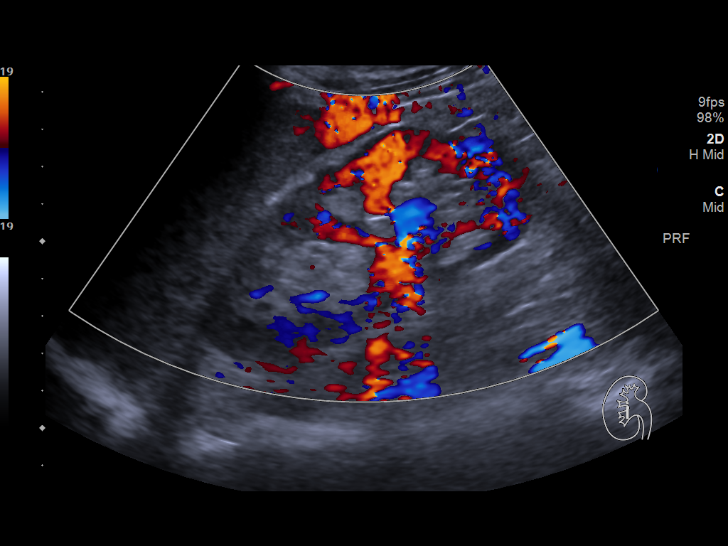
[im 89/107]
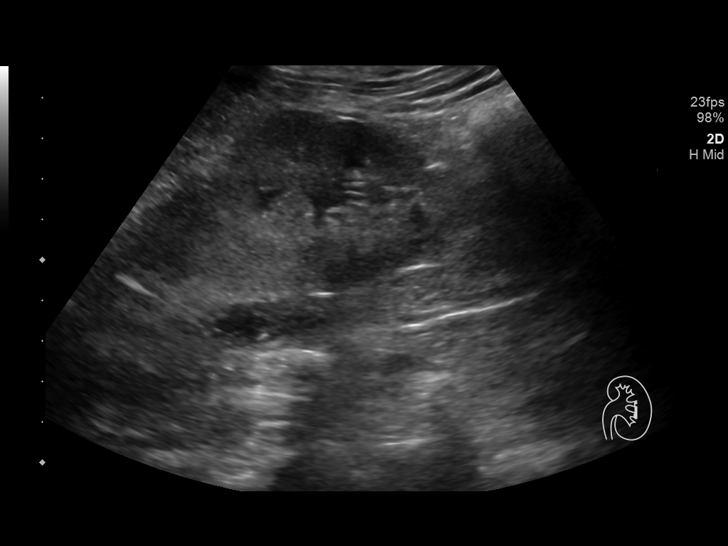
[im 98/107]
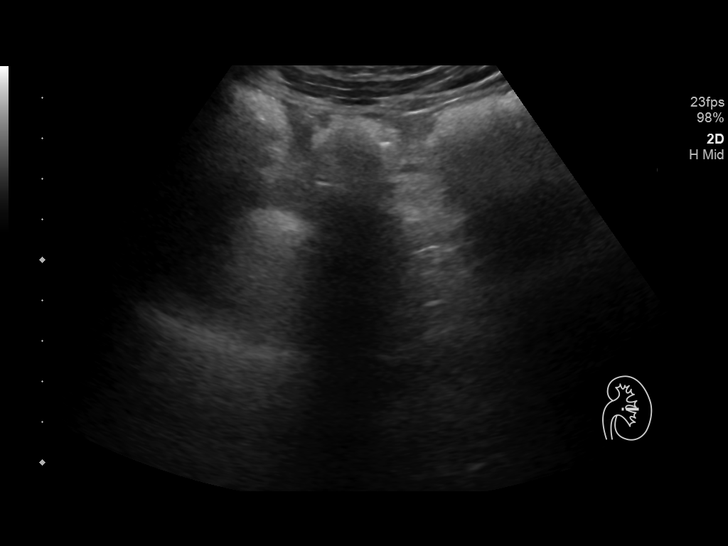
[im 107/107]
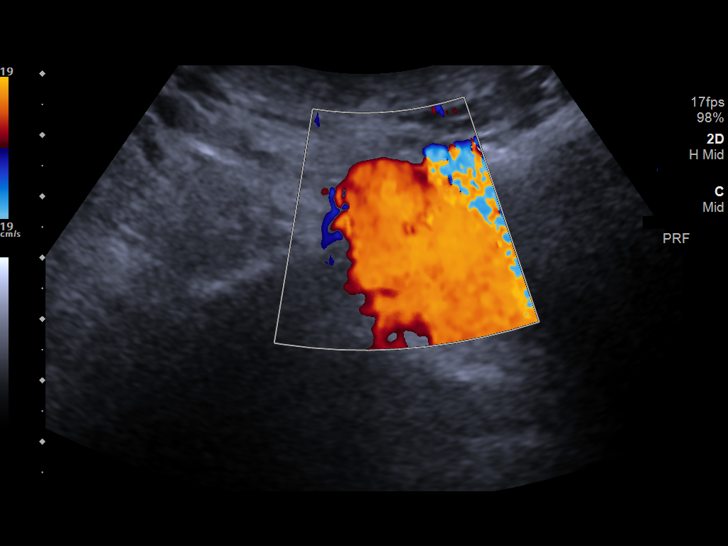

[14 of 25 positions shown; findings below may reference images not displayed]

FINDINGS: Gallbladder: No gallstones or wall thickening visualized. No
sonographic Murphy sign noted by sonographer.

Common bile duct: Diameter: 6 mm.

Liver: No focal lesion identified. Within normal limits in
parenchymal echogenicity. Portal vein is patent on color Doppler
imaging with normal direction of blood flow towards the liver.

IVC: No abnormality visualized.

Pancreas: Visualized portion unremarkable.

Spleen: Size and appearance within normal limits.

Right Kidney: Length: 10 cm. Echogenicity within normal limits. No
mass or hydronephrosis visualized.

Left Kidney: Length: 10 cm. Echogenicity within normal limits. No
mass or hydronephrosis visualized.

Abdominal aorta: No aneurysm visualized.

Other findings: None.
IMPRESSION: Unremarkable upper abdominal ultrasound.

## 2021-12-01 ENCOUNTER — Other Ambulatory Visit: Payer: Self-pay | Admitting: Cardiology

## 2021-12-01 DIAGNOSIS — R42 Dizziness and giddiness: Secondary | ICD-10-CM

## 2021-12-01 DIAGNOSIS — I2510 Atherosclerotic heart disease of native coronary artery without angina pectoris: Secondary | ICD-10-CM

## 2021-12-01 DIAGNOSIS — I10 Essential (primary) hypertension: Secondary | ICD-10-CM

## 2021-12-01 DIAGNOSIS — R002 Palpitations: Secondary | ICD-10-CM

## 2021-12-01 DIAGNOSIS — E7800 Pure hypercholesterolemia, unspecified: Secondary | ICD-10-CM

## 2021-12-14 DIAGNOSIS — Z006 Encounter for examination for normal comparison and control in clinical research program: Secondary | ICD-10-CM

## 2021-12-14 MED ORDER — STUDY - ORION 4 - INCLISIRAN 300 MG/1.5 ML OR PLACEBO SQ INJECTION (PI-STUCKEY): 300.0000 mg | INJECTION | SUBCUTANEOUS | Status: DC

## 2021-12-23 DIAGNOSIS — Z006 Encounter for examination for normal comparison and control in clinical research program: Secondary | ICD-10-CM

## 2021-12-23 MED ORDER — STUDY - ORION 4 - INCLISIRAN 300 MG/1.5 ML OR PLACEBO SQ INJECTION (PI-STUCKEY): 300.0000 mg | INJECTION | SUBCUTANEOUS | 0 refills | Status: AC

## 2022-04-19 DIAGNOSIS — Z006 Encounter for examination for normal comparison and control in clinical research program: Secondary | ICD-10-CM

## 2022-04-19 NOTE — Research (Signed)
Patient was seen in the research clinic for Month 15 visit of the [place] 4 trial. All medications reviewed and no changes noted at this time. Denies any recent adverse events or hospitalizations since last clinic visit. Labs and IP given per protocol. Next visit, month 21 scheduled for September 29, 2022 @ 0830.  IP given in the LLQ @ 0850. Patient tolerated well without complaints. Box # [id number]   Current Outpatient Medications:    B Complex-C (SUPER B COMPLEX/VITAMIN C) TABS, Take 1 tablet by mouth daily., Disp: , Rfl:    Barberry-Oreg Grape-Goldenseal (BERBERINE COMPLEX) 200-200-50 MG CAPS, Take 1 capsule by mouth daily as needed., Disp: , Rfl:    bisacodyl (DULCOLAX) 5 MG EC tablet, Take 5 mg by mouth daily as needed., Disp: , Rfl:    cetirizine (ZYRTEC) 10 MG tablet, Take 10 mg by mouth daily as needed for allergies., Disp: , Rfl:    co-enzyme Q-10 30 MG capsule, Take 100 mg by mouth as needed., Disp: , Rfl:    docusate sodium (COLACE) 100 MG capsule, Take 100 mg by mouth daily as needed for mild constipation., Disp: , Rfl:    fluticasone (FLONASE) 50 MCG/ACT nasal spray, USE 2 SPRAYS IN EACH  NOSTRIL DAILY, Disp: 48 g, Rfl: 1   Magnesium 250 MG TABS, Take 1 tablet by mouth daily., Disp: , Rfl:    Menaquinone-7 (VITAMIN K2) 100 MCG CAPS, Take 1 capsule by mouth as needed., Disp: , Rfl:    metroNIDAZOLE (METROGEL) 1 % gel, APPLY TO AFFECTED AREA(S)  TOPICALLY DAILY AS NEEDED  FOR ROSACEA, Disp: 120 g, Rfl: 1   Naphazoline-Pheniramine (OPCON-A OP), Apply 1 drop to eye as needed., Disp: , Rfl:    rosuvastatin (CRESTOR) 10 MG tablet, TAKE 1 TABLET(10 MG) BY MOUTH DAILY, Disp: 90 tablet, Rfl: 3   Study - ORION 4 - inclisiran 300 mg/1.31m or placebo SQ injection (PI-Stuckey), Inject 1.5 mLs (300 mg total) into the skin every 6 (six) months., Disp: 1 mL, Rfl: 0   Turmeric 500 MG CAPS, Take 1 capsule by mouth daily., Disp: , Rfl:    Vitamin D, Cholecalciferol, 25 MCG (1000 UT) TABS, Take 1 capsule by  mouth daily., Disp: , Rfl:   Current Facility-Administered Medications:    Study - ORION 4 - inclisiran 300 mg/1.571mor placebo SQ injection (PI-Stuckey), 300 mg, Subcutaneous, Q6 months, StLia FoyerThLoretha BrasilMD

## 2022-04-26 ENCOUNTER — Encounter: Payer: Self-pay | Admitting: Internal Medicine

## 2022-04-26 ENCOUNTER — Ambulatory Visit (INDEPENDENT_AMBULATORY_CARE_PROVIDER_SITE_OTHER): Payer: Medicare Other | Admitting: Internal Medicine

## 2022-04-26 VITALS — BP 118/70 | HR 52 | Temp 98.1°F | Ht 67.0 in | Wt 134.0 lb

## 2022-04-26 DIAGNOSIS — R739 Hyperglycemia, unspecified: Secondary | ICD-10-CM

## 2022-04-26 DIAGNOSIS — E559 Vitamin D deficiency, unspecified: Secondary | ICD-10-CM

## 2022-04-26 DIAGNOSIS — I2510 Atherosclerotic heart disease of native coronary artery without angina pectoris: Secondary | ICD-10-CM | POA: Diagnosis not present

## 2022-04-26 DIAGNOSIS — L03116 Cellulitis of left lower limb: Secondary | ICD-10-CM

## 2022-04-26 DIAGNOSIS — N1831 Chronic kidney disease, stage 3a: Secondary | ICD-10-CM

## 2022-04-26 DIAGNOSIS — D485 Neoplasm of uncertain behavior of skin: Secondary | ICD-10-CM | POA: Insufficient documentation

## 2022-04-26 DIAGNOSIS — E7800 Pure hypercholesterolemia, unspecified: Secondary | ICD-10-CM | POA: Diagnosis not present

## 2022-04-26 LAB — HEMOGLOBIN A1C: Hgb A1c MFr Bld: 5.9 % (ref 4.6–6.5)

## 2022-04-26 LAB — HEPATIC FUNCTION PANEL
ALT: 15 U/L (ref 0–53)
AST: 23 U/L (ref 0–37)
Albumin: 4.7 g/dL (ref 3.5–5.2)
Alkaline Phosphatase: 32 U/L — ABNORMAL LOW (ref 39–117)
Bilirubin, Direct: 0.1 mg/dL (ref 0.0–0.3)
Total Bilirubin: 0.7 mg/dL (ref 0.2–1.2)
Total Protein: 7.4 g/dL (ref 6.0–8.3)

## 2022-04-26 LAB — LIPID PANEL
Cholesterol: 362 mg/dL — ABNORMAL HIGH (ref 0–200)
HDL: 112.5 mg/dL (ref >39.00)
LDL Cholesterol: 236 mg/dL — ABNORMAL HIGH (ref 0–99)
NonHDL: 249.46
Total CHOL/HDL Ratio: 3
Triglycerides: 65 mg/dL (ref 0.0–149.0)
VLDL: 13 mg/dL (ref 0.0–40.0)

## 2022-04-26 LAB — BASIC METABOLIC PANEL
BUN: 22 mg/dL (ref 6–23)
CO2: 29 mEq/L (ref 19–32)
Calcium: 9.9 mg/dL (ref 8.4–10.5)
Chloride: 100 mEq/L (ref 96–112)
Creatinine, Ser: 1.29 mg/dL (ref 0.40–1.50)
GFR: 55.72 mL/min — ABNORMAL LOW (ref >60.00)
Glucose, Bld: 93 mg/dL (ref 70–99)
Potassium: 4 mEq/L (ref 3.5–5.1)
Sodium: 138 mEq/L (ref 135–145)

## 2022-04-26 LAB — VITAMIN D 25 HYDROXY (VIT D DEFICIENCY, FRACTURES): VITD: 49.15 ng/mL (ref 30.00–100.00)

## 2022-04-26 MED ORDER — DOXYCYCLINE HYCLATE 100 MG PO TABS: 100.0000 mg | ORAL_TABLET | Freq: Two times a day (BID) | ORAL | 0 refills | Status: DC

## 2022-04-26 NOTE — Patient Instructions (Signed)
Please take all new medication as prescribed - the antibiotic  Please continue all other medications as before, and refills have been done if requested.  Please have the pharmacy call with any other refills you may need.  Please continue your efforts at being more active, low cholesterol diet, and weight control.  Please keep your appointments with your specialists as you may have planned  Please go to the LAB at the blood drawing area for the tests to be done  You will be contacted by phone if any changes need to be made immediately.  Otherwise, you will receive a letter about your results with an explanation, but please check with MyChart first.  Please remember to sign up for MyChart if you have not done so, as this will be important to you in the future with finding out test results, communicating by private email, and scheduling acute appointments online when needed.  Please make an Appointment to return in 6 months, or sooner if needed

## 2022-04-26 NOTE — Progress Notes (Addendum)
Patient ID: Jerry Rios, male   DOB: 02-May-1951, 71 y.o.   MRN: [id number]        Chief Complaint: follow up HTN, HLD and hyperglycemia, ckd, foot pain       HPI:  Jerry Rios is a 71 y.o. male here with c/o 3 days onset worsening red, swelling tender to plantar aspect of 5th MTP area left foot with hx of punch biopsy there 3 wks ago per dermatology.  Has gone back to walking this wk as he thought it now safe to do so.  Pt denies chest pain, increased sob or doe, wheezing, orthopnea, PND, increased LE swelling, palpitations, or syncope, though did have 3 day episode of low bp and HR 3 wks ago but since resolved..   Pt denies polydipsia, polyuria, or new focal neuro s/s.    Pt denies fever, wt loss, night sweats, loss of appetite, or other constitutional symptoms         Wt Readings from Last 3 Encounters:  04/27/22 132 lb 9.6 oz (60.1 kg)  04/26/22 134 lb (60.8 kg)  10/25/21 138 lb (62.6 kg)   BP Readings from Last 3 Encounters:  04/27/22 110/60  04/26/22 118/70  10/25/21 120/62         Past Medical History:  Diagnosis Date   Abdominal pain, unspecified site 05/30/2014   Allergic rhinitis 04/08/2007   Qualifier: Diagnosis of  By: [name] MD, [name]    Allergy    Arthritis    lumbar spine   Atherosclerosis    BACK PAIN, LUMBAR 07/10/2008   Qualifier: Diagnosis of  By: [name] MD, [place] PROSTATIC HYPERTROPHY 08/05/2007   Qualifier: Diagnosis of  By: [name] MD, [name]    BPH (benign prostatic hypertrophy)    Cataract    starting- mild    Cervical radiculopathy 08/01/2017   CKD (chronic kidney disease), stage III ([place])    COLONIC POLYPS, HX OF 08/05/2007   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    Depression 04/08/2007   Qualifier: Diagnosis of  By: [name] MD, [name]    Diverticulosis of colon    DIVERTICULOSIS, COLON 08/05/2007   Qualifier: Diagnosis of  By: [name] MD, [place], ATOPIC 11/12/2009   Qualifier: Diagnosis of  By: [name] MD, Arvid Right.    HEMORRHOIDS 07/10/2008   Qualifier: Diagnosis of  By: [name] MD, [name]    History of nephrolithiasis    History of shingles    Hx of colonic polyp    Hyperglycemia 11/19/2014   Hyperlipidemia    Hypertension    some past history    Insomnia    Insomnia, unspecified 04/08/2007   Centricity Description: SYMPTOM, INSOMNIA NOS Qualifier: Diagnosis of  By: [name] MD, [name]  Centricity Description: INSOMNIA-SLEEP DISORDER-UNSPEC Qualifier: Diagnosis of  By: [name] MD, [name]    KNEE PAIN, LEFT 07/10/2008   Qualifier: Diagnosis of  By: [name] MD, [name]    Libido, decreased 10/04/2011   Loss of weight 05/30/2014   Multinodular goiter 12/14/2016   Nausea in adult patient 05/30/2014   NEPHROLITHIASIS, HX OF 08/05/2007   Qualifier: Diagnosis of  By: [name] MD, [name]    RENAL INSUFFICIENCY 09/07/2010   Qualifier: Diagnosis of  By: [name] MD, [name]    Right leg pain 10/04/2018  Stroke ([place]) 10/2014   TIA (transient ischemic attack)    Vitiligo 10/31/2012   Past Surgical History:  Procedure Laterality Date   CERVICAL DISC ARTHROPLASTY  12/08/2017   COLONOSCOPY     CYSTOSCOPY WITH URETEROSCOPY AND STENT PLACEMENT Left 07/04/2014   Procedure: CYSTOSCOPY WITH LEFT RETROGRADE PYLEOGRAM, LEFT URETERAL DILATION AND URETEROSCOPY, LASER LITHOTRISPY AND STENT PLACEMENT;  Surgeon: [name], MD;  Location: [facility];  Service: Urology;  Laterality: Left;   HOLMIUM LASER APPLICATION N/A 40/05/8118   Procedure:  LASER LITHOTRIPSY;  Surgeon: [name], MD;  Location: [facility];  Service: Urology;  Laterality: N/A;   lithotrypsy     x 3   POLYPECTOMY     TONSILLECTOMY      reports that he has quit smoking. His smoking use included cigarettes. He has a 5.00 pack-year smoking history. He has never used smokeless tobacco. He reports that he does not currently use alcohol after a past usage of about 7.0 standard drinks of alcohol  per week. He reports that he does not use drugs. family history includes CAD in his father and mother; Colitis in his sister; Heart disease in an other family member; Heart failure in his father, maternal grandfather, and mother; Hyperlipidemia in an other family member; Hypertension in an other family member; Lupus in his paternal grandfather; Ulcerative colitis in his sister. Allergies  Allergen Reactions   Clobetasol     Other reaction(s): Unknown   Adhesive [Tape] Rash   Crestor [Rosuvastatin]     REACTION: myalgia   Lipitor [Atorvastatin]     REACTION: myalgia   Nexlizet [Bempedoic Acid-Ezetimibe] Itching    Myalgia in fingers, hands, feet   Praluent [Alirocumab]     ineffective   Repatha [Evolocumab]     ineffective   Zocor [Simvastatin]     REACTION: myalgia   Current Outpatient Medications on File Prior to Visit  Medication Sig Dispense Refill   B Complex-C (SUPER B COMPLEX/VITAMIN C) TABS Take 1 tablet by mouth daily.     Barberry-Oreg Grape-Goldenseal (BERBERINE COMPLEX) 200-200-50 MG CAPS Take 1 capsule by mouth daily as needed.     bisacodyl (DULCOLAX) 5 MG EC tablet Take 5 mg by mouth daily as needed.     cetirizine (ZYRTEC) 10 MG tablet Take 10 mg by mouth daily as needed for allergies.     co-enzyme Q-10 30 MG capsule Take 100 mg by mouth as needed.     docusate sodium (COLACE) 100 MG capsule Take 100 mg by mouth daily as needed for mild constipation.     fluticasone (FLONASE) 50 MCG/ACT nasal spray USE 2 SPRAYS IN EACH  NOSTRIL DAILY 48 g 1   Magnesium 250 MG TABS Take 1 tablet by mouth daily.     Menaquinone-7 (VITAMIN K2) 100 MCG CAPS Take 1 capsule by mouth as needed.     metroNIDAZOLE (METROGEL) 1 % gel APPLY TO AFFECTED AREA(S)  TOPICALLY DAILY AS NEEDED  FOR ROSACEA 120 g 1   Naphazoline-Pheniramine (OPCON-A OP) Apply 1 drop to eye as needed.     rosuvastatin (CRESTOR) 10 MG tablet TAKE 1 TABLET(10 MG) BY MOUTH DAILY 90 tablet 3   Study - ORION 4 - inclisiran  300 mg/1.7m or placebo SQ injection (PI-Stuckey) Inject 1.5 mLs (300 mg total) into the skin every 6 (six) months. 1 mL 0   Turmeric 500 MG CAPS Take 1 capsule by mouth daily.     Vitamin D, Cholecalciferol, 25 MCG (1000 UT)  TABS Take 1 capsule by mouth daily.     Current Facility-Administered Medications on File Prior to Visit  Medication Dose Route Frequency Provider Last Rate Last Admin   Study - ORION 4 - inclisiran 300 mg/1.27m or placebo SQ injection (PI-Stuckey)  300 mg Subcutaneous Q6 months SHillary Bow MD            ROS:  All others reviewed and negative.  Objective        PE:  BP 118/70 (BP Location: Left Arm, Patient Position: Sitting, Cuff Size: Large)   Pulse (!) 52   Temp 98.1 F (36.7 C) (Oral)   Ht '5\' 7"'$  (1.702 m)   Wt 134 lb (60.8 kg)   SpO2 98%   BMI 20.99 kg/m                 Constitutional: Pt appears in NAD               HENT: Head: NCAT.                Right Ear: External ear normal.                 Left Ear: External ear normal.                Eyes: . Pupils are equal, round, and reactive to light. Conjunctivae and EOM are normal               Nose: without d/c or deformity               Neck: Neck supple. Gross normal ROM               Cardiovascular: Normal rate and regular rhythm.                 Pulmonary/Chest: Effort normal and breath sounds without rales or wheezing.                Abd:  Soft, NT, ND, + BS, no organomegaly               Neurological: Pt is alert. At baseline orientation, motor grossly intact               Skin: LE edema - none, but has 1+ tender, red, swelling without ulcer near a central previous punch biopsy at the plantar aspect right 5th mtp area; no red streaks, no drainage or ulcer               Psychiatric: Pt behavior is normal without agitation   Micro: none  Cardiac tracings I have personally interpreted today:  none  Pertinent Radiological findings (summarize): none   Lab Results  Component Value Date   WBC  5.0 11/04/2021   HGB 13.9 11/04/2021   HCT 41.5 11/04/2021   PLT 218.0 11/04/2021   GLUCOSE 93 04/26/2022   CHOL 362 (H) 04/26/2022   TRIG 65.0 04/26/2022   HDL 112.50 04/26/2022   LDLDIRECT 206.1 11/01/2013   LDLCALC 236 (H) 04/26/2022   ALT 15 04/26/2022   AST 23 04/26/2022   NA 138 04/26/2022   K 4.0 04/26/2022   CL 100 04/26/2022   CREATININE 1.29 04/26/2022   BUN 22 04/26/2022   CO2 29 04/26/2022   TSH 1.47 11/04/2021   PSA 1.21 11/04/2021   HGBA1C 5.9 04/26/2022   Assessment/Plan:  RPRATT BRESSis a 71y.o. White or Caucasian [1] male with  has a past medical  history of Abdominal pain, unspecified site (05/30/2014), Allergic rhinitis (04/08/2007), Allergy, Arthritis, Atherosclerosis, BACK PAIN, LUMBAR (07/10/2008), BENIGN PROSTATIC HYPERTROPHY (08/05/2007), BPH (benign prostatic hypertrophy), Cataract, Cervical radiculopathy (08/01/2017), CKD (chronic kidney disease), stage III ([place]), COLONIC POLYPS, HX OF (08/05/2007), Cough (11/12/2009), Depression (04/08/2007), Diverticulosis of colon, DIVERTICULOSIS, COLON (08/05/2007), ECZEMA, ATOPIC (11/12/2009), HEMORRHOIDS (07/10/2008), History of nephrolithiasis, History of shingles, colonic polyp, Hyperglycemia (11/19/2014), Hyperlipidemia, Hypertension, Insomnia, Insomnia, unspecified (04/08/2007), KNEE PAIN, LEFT (07/10/2008), Libido, decreased (10/04/2011), Loss of weight (05/30/2014), Multinodular goiter (12/14/2016), Nausea in adult patient (05/30/2014), NEPHROLITHIASIS, HX OF (08/05/2007), RENAL INSUFFICIENCY (09/07/2010), Right leg pain (10/04/2018), Stroke ([place]) (10/2014), TIA (transient ischemic attack), and Vitiligo (10/31/2012).  Hyperlipidemia Lab Results  Component Value Date   LDLCALC 236 (H) 04/26/2022   Very severe, pt to restart current statin crestor 10 mg qd   Hyperglycemia Lab Results  Component Value Date   HGBA1C 5.9 04/26/2022   Stable, pt to continue current medical treatment  - diet, wt control, excercise   CKD (chronic  kidney disease) stage 3, GFR 30-59 ml/min Lab Results  Component Value Date   CREATININE 1.29 04/26/2022   Stable overall, cont to avoid nephrotoxins  Cellulitis of left foot Mild to mod, for antibx course - doxycycline,  to f/u any worsening symptoms or concerns  Followup: Return in about 6 months (around 10/27/2022).  Cathlean Cower, MD 04/27/2022 9:21 PM [facility]

## 2022-04-27 ENCOUNTER — Encounter: Payer: Self-pay | Admitting: Internal Medicine

## 2022-04-27 ENCOUNTER — Encounter: Payer: Self-pay | Admitting: Gastroenterology

## 2022-04-27 ENCOUNTER — Ambulatory Visit (INDEPENDENT_AMBULATORY_CARE_PROVIDER_SITE_OTHER): Payer: Medicare Other | Admitting: Gastroenterology

## 2022-04-27 VITALS — BP 110/60 | HR 61 | Ht 67.0 in | Wt 132.6 lb

## 2022-04-27 DIAGNOSIS — I2510 Atherosclerotic heart disease of native coronary artery without angina pectoris: Secondary | ICD-10-CM | POA: Diagnosis not present

## 2022-04-27 DIAGNOSIS — K602 Anal fissure, unspecified: Secondary | ICD-10-CM

## 2022-04-27 DIAGNOSIS — K6289 Other specified diseases of anus and rectum: Secondary | ICD-10-CM

## 2022-04-27 MED ORDER — DILTIAZEM GEL 2 %: 1.0000 | Freq: Three times a day (TID) | CUTANEOUS | 1 refills | Status: DC

## 2022-04-27 NOTE — Patient Instructions (Signed)
We have sent the following medications to your pharmacy for you to pick up at your convenience:   Diltiazem ointment [place] 1 tablespoon 2-3 times daily  _______________________________________________________  Adriana Reams, Adult  An anal fissure is a small tear or crack in the tissue of the anus. Bleeding from a fissure usually stops on its own within a few minutes. However, bleeding will often occur again with each bowel movement until the fissure heals. What are the causes? This condition is usually caused by passing a large or hard stool (feces). Other causes include: Constipation. Frequent diarrhea. Inflammatory bowel disease (Crohn's disease or ulcerative colitis). Childbirth. Infections. Anal sex. What are the signs or symptoms? Symptoms of this condition include: Bleeding from the rectum. Small amounts of blood seen on your stool, on the toilet paper, or in the toilet after a bowel movement. The blood coats the outside of the stool and is not mixed with the stool. Painful bowel movements. Itching or irritation around the anus. How is this diagnosed? A health care provider may diagnose this condition by closely examining the anal area. An anal fissure can usually be seen with careful inspection. In some cases, a rectal exam may be performed, or a short tube (anoscope) may be used to examine the anal canal. How is this treated? Initial treatment for this condition may include: Taking steps to avoid constipation. This may include making changes to your diet, such as increasing your intake of fiber or fluid. Taking fiber supplements. These supplements can soften your stool to help make bowel movements easier. Your health care provider may also prescribe a stool softener if your stool is hard. Taking sitz baths. This may help to heal the tear. Using medicated creams or ointments. These may be prescribed to lessen discomfort. Treatments that are  sometimes used if initial treatments do not work well or if the condition is more severe may include: Botulinum injection. Surgery to repair the fissure. Follow these instructions at home: Eating and drinking  Avoid foods that may cause constipation, such as bananas, milk, and other dairy products. Eat all fruits, except bananas. Drink enough fluid to keep your urine pale yellow. Eat foods that are high in fiber, such as beans, whole grains, and fresh fruits and vegetables. General instructions  Take over-the-counter and prescription medicines only as told by your health care provider. Use creams or ointments only as told by your health care provider. Keep the anal area clean and dry. Take sitz baths as told by your health care provider. Do not use soap in the sitz baths. Keep all follow-up visits as told by your health care provider. This is important. Contact a health care provider if you have: More bleeding. A fever. Diarrhea that is mixed with blood. Pain that continues. Ongoing problems that are getting worse rather than better. Summary An anal fissure is a small tear or crack in the tissue of the anus. This condition is usually caused by passing a large or hard stool (feces). Other causes include constipation and frequent diarrhea. Initial treatment for this condition may include taking steps to avoid constipation, such as increasing your intake of fiber or fluid. Follow instructions for care as told by your health care provider. Contact your health care provider if you have more bleeding or your problem is getting worse rather than better. Keep all follow-up visits as told by your health care provider. This is important. This information is not intended to replace advice given  to you by your health care provider. Make sure you discuss any questions you have with your health care provider. Document Revised: 04/08/2021 Document Reviewed: 04/08/2021 Elsevier Patient Education   [place].   If you are age 72 or older, your body mass index should be between 23-30. Your Body mass index is 20.77 kg/m. If this is out of the aforementioned range listed, please consider follow up with your Primary Care Provider.  If you are age 72 or younger, your body mass index should be between 19-25. Your Body mass index is 20.77 kg/m. If this is out of the aformentioned range listed, please consider follow up with your Primary Care Provider.   ________________________________________________________  The [facility] providers would like to encourage you to use [facility] to communicate with providers for non-urgent requests or questions.  Due to long hold times on the telephone, sending your provider a message by [facility] may be a faster and more efficient way to get a response.  Please allow 48 business hours for a response.  Please remember that this is for non-urgent requests.  _______________________________________________________   I appreciate the  opportunity to care for you  Thank You   Harl Bowie , MD

## 2022-04-27 NOTE — Progress Notes (Signed)
Jerry Rios    [id number]    [birth year]  Primary Care Physician:John, [name], MD  Referring Physician: [name], MD [place],  [place] [address]   Chief complaint: Rectal pain  HPI:  71 year old very pleasant gentleman here for follow-up visit with complaints of rectal discomfort and pain, worse with bowel movements.  He is experiencing intermittent constipation and incomplete evacuation.  Denies any rectal bleeding   EGD November 06, 2020 - The examined esophagus was normal. - The gastroesophageal flap valve was visualized endoscopically and classified as Hill Grade II (fold present, opens with respiration). - Scattered mild inflammation characterized by congestion (edema), erosions and erythema was found in the entire examined stomach. Biopsies were taken with a cold forceps for histology. Biopsies were taken with a cold forceps for Helicobacter pylori testing. - The first portion of the duodenum and second portion of the duodenum were normal. Biopsies were taken with a cold forceps for histology. 1. Surgical [P], duodenal bulb - DUODENAL MUCOSA WITH NO SIGNIFICANT PATHOLOGIC FINDINGS. - NEGATIVE FOR INCREASED INTRAEPITHELIAL LYMPHOCYTES AND VILLOUS ARCHITECTURAL CHANGES. 2. Surgical [P], gastric antrum and gastric body - MILD CHRONIC GASTRITIS. - WARTHIN-STARRY STAIN IS NEGATIVE FOR HELICOBACTER PYLORI.   Colonoscopy 01/03/2017: By Dr. Henrene Pastor showed diverticulosis and hemorrhoids otherwise unremarkable exam   Outpatient Encounter Medications as of 04/27/2022  Medication Sig   B Complex-C (SUPER B COMPLEX/VITAMIN C) TABS Take 1 tablet by mouth daily.   Barberry-Oreg Grape-Goldenseal (BERBERINE COMPLEX) 200-200-50 MG CAPS Take 1 capsule by mouth daily as needed.   bisacodyl (DULCOLAX) 5 MG EC tablet Take 5 mg by mouth daily as needed.   cetirizine (ZYRTEC) 10 MG tablet Take 10 mg by mouth daily as needed for allergies.   co-enzyme Q-10  30 MG capsule Take 100 mg by mouth as needed.   docusate sodium (COLACE) 100 MG capsule Take 100 mg by mouth daily as needed for mild constipation.   doxycycline (VIBRA-TABS) 100 MG tablet Take 1 tablet (100 mg total) by mouth 2 (two) times daily.   fluticasone (FLONASE) 50 MCG/ACT nasal spray USE 2 SPRAYS IN EACH  NOSTRIL DAILY   Magnesium 250 MG TABS Take 1 tablet by mouth daily.   Menaquinone-7 (VITAMIN K2) 100 MCG CAPS Take 1 capsule by mouth as needed.   metroNIDAZOLE (METROGEL) 1 % gel APPLY TO AFFECTED AREA(S)  TOPICALLY DAILY AS NEEDED  FOR ROSACEA   Naphazoline-Pheniramine (OPCON-A OP) Apply 1 drop to eye as needed.   rosuvastatin (CRESTOR) 10 MG tablet TAKE 1 TABLET(10 MG) BY MOUTH DAILY   Study - ORION 4 - inclisiran 300 mg/1.45m or placebo SQ injection (PI-Stuckey) Inject 1.5 mLs (300 mg total) into the skin every 6 (six) months.   Turmeric 500 MG CAPS Take 1 capsule by mouth daily.   Vitamin D, Cholecalciferol, 25 MCG (1000 UT) TABS Take 1 capsule by mouth daily.   Facility-Administered Encounter Medications as of 04/27/2022  Medication   Study - ORION 4 - inclisiran 300 mg/1.554mor placebo SQ injection (PI-Stuckey)    Allergies as of 04/27/2022 - Review Complete 04/26/2022  Allergen Reaction Noted   Clobetasol  04/26/2022   Adhesive [tape] Rash 06/20/2014   Crestor [rosuvastatin]  04/08/2007   Lipitor [atorvastatin]  04/08/2007   Nexlizet [bempedoic acid-ezetimibe] Itching 10/29/2020   Praluent [alirocumab]  10/14/2019   Repatha [evolocumab]  10/14/2019   Zocor [simvastatin]  04/08/2007    Past Medical  History:  Diagnosis Date   Abdominal pain, unspecified site 05/30/2014   Allergic rhinitis 04/08/2007   Qualifier: Diagnosis of  By: [name] MD, [name]    Allergy    Arthritis    lumbar spine   Atherosclerosis    BACK PAIN, LUMBAR 07/10/2008   Qualifier: Diagnosis of  By: [name] MD, [name] HYPERTROPHY 08/05/2007   Qualifier: Diagnosis of  By: [name] MD,  [name]    BPH (benign prostatic hypertrophy)    Cataract    starting- mild    Cervical radiculopathy 08/01/2017   CKD (chronic kidney disease), stage III ([place])    COLONIC POLYPS, HX OF 08/05/2007   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    Depression 04/08/2007   Qualifier: Diagnosis of  By: [name] MD, [name]    Diverticulosis of colon    DIVERTICULOSIS, COLON 08/05/2007   Qualifier: Diagnosis of  By: [name] MD, [place], ATOPIC 11/12/2009   Qualifier: Diagnosis of  By: [name] MD, Arvid Right.    HEMORRHOIDS 07/10/2008   Qualifier: Diagnosis of  By: [name] MD, [name]    History of nephrolithiasis    History of shingles    Hx of colonic polyp    Hyperglycemia 11/19/2014   Hyperlipidemia    Hypertension    some past history    Insomnia    Insomnia, unspecified 04/08/2007   Centricity Description: SYMPTOM, INSOMNIA NOS Qualifier: Diagnosis of  By: [name] MD, [name]  Centricity Description: INSOMNIA-SLEEP DISORDER-UNSPEC Qualifier: Diagnosis of  By: [name] MD, [name]    KNEE PAIN, LEFT 07/10/2008   Qualifier: Diagnosis of  By: [name] MD, [name]    Libido, decreased 10/04/2011   Loss of weight 05/30/2014   Multinodular goiter 12/14/2016   Nausea in adult patient 05/30/2014   NEPHROLITHIASIS, HX OF 08/05/2007   Qualifier: Diagnosis of  By: [name] MD, [name]    RENAL INSUFFICIENCY 09/07/2010   Qualifier: Diagnosis of  By: [name] MD, [name]    Right leg pain 10/04/2018   Stroke ([place]) 10/2014   TIA (transient ischemic attack)    Vitiligo 10/31/2012    Past Surgical History:  Procedure Laterality Date   CERVICAL DISC ARTHROPLASTY  12/08/2017   COLONOSCOPY     CYSTOSCOPY WITH URETEROSCOPY AND STENT PLACEMENT Left 07/04/2014   Procedure: CYSTOSCOPY WITH LEFT RETROGRADE PYLEOGRAM, LEFT URETERAL DILATION AND URETEROSCOPY, LASER LITHOTRISPY AND STENT PLACEMENT;  Surgeon: [name], MD;  Location: [facility];   Service: Urology;  Laterality: Left;   HOLMIUM LASER APPLICATION N/A 46/02/5992   Procedure:  LASER LITHOTRIPSY;  Surgeon: [name], MD;  Location: [facility];  Service: Urology;  Laterality: N/A;   lithotrypsy     x 3   POLYPECTOMY     TONSILLECTOMY      Family History  Problem Relation Age of Onset   Ulcerative colitis Sister    Colitis Sister    Heart disease Other        Grandparents   Hyperlipidemia Other    Hypertension Other    Heart failure Mother    CAD Mother        died of CAD at age 36   Heart failure Father    CAD Father        died from cardiac arrest at  29   Lupus Paternal Grandfather    Heart failure Maternal Grandfather    Colon cancer Neg Hx    Esophageal cancer Neg Hx    Pancreatic cancer Neg Hx    Stomach cancer Neg Hx    Liver disease Neg Hx    Colon polyps Neg Hx    Rectal cancer Neg Hx    Diabetes Neg Hx     Social History   Socioeconomic History   Marital status: Single    Spouse name: Not on file   Number of children: Not on file   Years of education: Not on file   Highest education level: Not on file  Occupational History   Occupation: IT  Tobacco Use   Smoking status: Former    Packs/day: 0.50    Years: 10.00    Total pack years: 5.00    Types: Cigarettes   Smokeless tobacco: Never   Tobacco comments:    Regular exercise - No  Substance and Sexual Activity   Alcohol use: Not Currently    Alcohol/week: 7.0 standard drinks of alcohol    Types: 7 Cans of beer per week   Drug use: No   Sexual activity: Never  Other Topics Concern   Not on file  Social History Narrative   Works at [employer], divorce, no biological children   Social Determinants of Radio broadcast assistant Strain: Not on file  Food Insecurity: Not on file  Transportation Needs: Not on file  Physical Activity: Not on file  Stress: Not on file  Social Connections: Not on file  Intimate Partner Violence: Not on file      Review of systems: All  other review of systems negative except as mentioned in the HPI.   Physical Exam: Vitals:   04/27/22 1046  BP: 110/60  Pulse: 61  SpO2: 100%   Body mass index is 20.77 kg/m. Gen:      No acute distress HEENT:  sclera anicteric Abd:      soft, non-tender; no palpable masses, no distension Ext:    No edema Neuro: alert and oriented x 3 Psych: normal mood and affect Rectal exam: Increased anal sphincter tone with anal rectal tenderness, + anal fissure  Data Reviewed:  Reviewed labs, radiology imaging, old records and pertinent past GI work up   Assessment and Plan/Recommendations:  71 year old very pleasant gentleman with rectal pain and discomfort secondary to anal fissure Use diltiazem 2% gel small pea-sized amount per rectum 3 times daily for 6 to 8 weeks   Advised patient to try Benefiber or any soluble fiber and stop the insoluble fiber to improve bowel habits Use MiraLAX as needed and titrate based on response to have 1-2 soft bowel movement daily   This visit required 40 minutes of patient care (this includes precharting, chart review, review of results, face-to-face time used for counseling as well as treatment plan and follow-up. The patient was provided an opportunity to ask questions and all were answered. The patient agreed with the plan and demonstrated an understanding of the instructions.  Damaris Hippo , MD    CC: [name], MD

## 2022-04-27 NOTE — Assessment & Plan Note (Signed)
Mild to mod, for antibx course - doxycycline,  to f/u any worsening symptoms or concerns

## 2022-04-27 NOTE — Assessment & Plan Note (Signed)
Lab Results  Component Value Date   CREATININE 1.29 04/26/2022   Stable overall, cont to avoid nephrotoxins

## 2022-04-27 NOTE — Assessment & Plan Note (Signed)
Lab Results  Component Value Date   LDLCALC 236 (H) 04/26/2022   Very severe, pt to restart current statin crestor 10 mg qd

## 2022-04-27 NOTE — Assessment & Plan Note (Signed)
Lab Results  Component Value Date   HGBA1C 5.9 04/26/2022   Stable, pt to continue current medical treatment  - diet, wt control, excercise

## 2022-05-02 ENCOUNTER — Ambulatory Visit (INDEPENDENT_AMBULATORY_CARE_PROVIDER_SITE_OTHER): Payer: Medicare Other | Admitting: Podiatry

## 2022-05-02 ENCOUNTER — Ambulatory Visit (INDEPENDENT_AMBULATORY_CARE_PROVIDER_SITE_OTHER): Payer: Medicare Other

## 2022-05-02 ENCOUNTER — Encounter: Payer: Self-pay | Admitting: Podiatry

## 2022-05-02 DIAGNOSIS — I2510 Atherosclerotic heart disease of native coronary artery without angina pectoris: Secondary | ICD-10-CM | POA: Diagnosis not present

## 2022-05-02 DIAGNOSIS — L03119 Cellulitis of unspecified part of limb: Secondary | ICD-10-CM

## 2022-05-02 DIAGNOSIS — L02619 Cutaneous abscess of unspecified foot: Secondary | ICD-10-CM | POA: Diagnosis not present

## 2022-05-02 DIAGNOSIS — M21612 Bunion of left foot: Secondary | ICD-10-CM

## 2022-05-02 DIAGNOSIS — M21619 Bunion of unspecified foot: Secondary | ICD-10-CM | POA: Diagnosis not present

## 2022-05-02 MED ORDER — CIPROFLOXACIN HCL 500 MG PO TABS: 500.0000 mg | ORAL_TABLET | Freq: Two times a day (BID) | ORAL | 0 refills | Status: AC

## 2022-05-02 NOTE — Progress Notes (Signed)
Subjective:   Patient ID: Jerry Rios, male   DOB: 71 y.o.   MRN: [id number]   HPI Patient presents stating he is seeing Dr. Babs Bertin and had a punch biopsy done a number of weeks ago and it started to get swollen a week ago and he is currently taking doxycycline.  He feels like also he is walking on marbles   ROS      Objective:  Physical Exam  Neurovascular status was found to be intact negative Bevelyn Buckles' sign noted with patient having some erythema on the plantar aspect of the left fifth metatarsal and into the fourth interspace with a small indication of a previous punch biopsy no active drainage was noted no proximal edema erythema or drainage noted to this point.  Good digital perfusion well oriented x3     Assessment:  Probability that he has developed a low-grade infection secondary to the procedure performed by Dr. Babs Bertin that is not so far responding to doxycycline     Plan:  H&P x-ray reviewed I did sterile sharp debridement of the plantar lesion I did not note any drainage I am going to add on Cipro and I am referring him back to Dr. Babs Bertin who is providing his care.  I gave him strict instructions if he should develop any systemic signs of infection fever increased swelling drainage or redness that he is to go straight to the emergency room and ultimately may require opening up of the area or possible bone resection  X-rays indicate no current indications of osteolysis appears to be soft tissue pathology

## 2022-05-11 ENCOUNTER — Encounter: Payer: Self-pay | Admitting: Gastroenterology

## 2022-05-13 ENCOUNTER — Ambulatory Visit: Payer: Medicare Other | Admitting: Podiatry

## 2022-06-23 ENCOUNTER — Telehealth: Payer: Self-pay | Admitting: Internal Medicine

## 2022-06-23 NOTE — Telephone Encounter (Signed)
Left message for patient to call back to schedule Medicare Annual Wellness Visit   No hx of AWV eligible as of 09/26/21  Please schedule at anytime with [facility] Advisor if patient calls the office back.    57 Minutes appointment   Any questions, please call me at [phone]

## 2022-07-20 ENCOUNTER — Encounter: Payer: Self-pay | Admitting: Gastroenterology

## 2022-07-20 ENCOUNTER — Ambulatory Visit (INDEPENDENT_AMBULATORY_CARE_PROVIDER_SITE_OTHER): Payer: Medicare Other | Admitting: Gastroenterology

## 2022-07-20 VITALS — BP 96/60 | HR 56 | Ht 66.75 in | Wt 129.2 lb

## 2022-07-20 DIAGNOSIS — M533 Sacrococcygeal disorders, not elsewhere classified: Secondary | ICD-10-CM | POA: Diagnosis not present

## 2022-07-20 DIAGNOSIS — K602 Anal fissure, unspecified: Secondary | ICD-10-CM | POA: Diagnosis not present

## 2022-07-20 DIAGNOSIS — I2510 Atherosclerotic heart disease of native coronary artery without angina pectoris: Secondary | ICD-10-CM

## 2022-07-20 DIAGNOSIS — K5900 Constipation, unspecified: Secondary | ICD-10-CM

## 2022-07-20 DIAGNOSIS — K5902 Outlet dysfunction constipation: Secondary | ICD-10-CM | POA: Diagnosis not present

## 2022-07-20 MED ORDER — DILTIAZEM GEL 2 %: 1.0000 | Freq: Two times a day (BID) | CUTANEOUS | 1 refills | Status: DC

## 2022-07-20 NOTE — Progress Notes (Signed)
Jerry Rios    [id number]    [birth year]  Primary Care Physician:John, [name], MD  Referring Physician: [name], MD [address] [place],  [place] [address]   Chief complaint:  Anal fissure, pain in the coccyx   HPI:  71 year old very pleasant gentleman here for follow-up visit for anal fissure.  Overall he feels anorectal pain is improving with diltiazem, he no longer has discomfort with bowel movements.  On average she is using it once or twice a day.  He continues to have difficulty with complete evacuation.  He has significant pain near his coccyx and perirectal area especially when he is sitting down.  He has history of degenerative disc disease and arthritis of lumbosacral spine   EGD November 06, 2020 - The examined esophagus was normal. - The gastroesophageal flap valve was visualized endoscopically and classified as Hill Grade II (fold present, opens with respiration). - Scattered mild inflammation characterized by congestion (edema), erosions and erythema was found in the entire examined stomach. Biopsies were taken with a cold forceps for histology. Biopsies were taken with a cold forceps for Helicobacter pylori testing. - The first portion of the duodenum and second portion of the duodenum were normal. Biopsies were taken with a cold forceps for histology. 1. Surgical [P], duodenal bulb - DUODENAL MUCOSA WITH NO SIGNIFICANT PATHOLOGIC FINDINGS. - NEGATIVE FOR INCREASED INTRAEPITHELIAL LYMPHOCYTES AND VILLOUS ARCHITECTURAL CHANGES. 2. Surgical [P], gastric antrum and gastric body - MILD CHRONIC GASTRITIS. - WARTHIN-STARRY STAIN IS NEGATIVE FOR HELICOBACTER PYLORI.   Colonoscopy 01/03/2017: By Dr. Henrene Pastor showed diverticulosis and hemorrhoids otherwise unremarkable exam     Outpatient Encounter Medications as of 07/20/2022  Medication Sig   B Complex-C (SUPER B COMPLEX/VITAMIN C) TABS Take 1 tablet by mouth daily.   Barberry-Oreg  Grape-Goldenseal (BERBERINE COMPLEX) 200-200-50 MG CAPS Take 1 capsule by mouth daily as needed.   bisacodyl (DULCOLAX) 5 MG EC tablet Take 5 mg by mouth daily as needed.   cetirizine (ZYRTEC) 10 MG tablet Take 10 mg by mouth daily as needed for allergies.   co-enzyme Q-10 30 MG capsule Take 100 mg by mouth as needed.   diltiazem 2 % GEL Apply 1 Application topically 3 (three) times daily. Use pea sized amount per rectum three times a day for 8 weeks   docusate sodium (COLACE) 100 MG capsule Take 100 mg by mouth daily as needed for mild constipation.   fluticasone (FLONASE) 50 MCG/ACT nasal spray USE 2 SPRAYS IN EACH  NOSTRIL DAILY   Magnesium 250 MG TABS Take 1 tablet by mouth daily.   Menaquinone-7 (VITAMIN K2) 100 MCG CAPS Take 1 capsule by mouth as needed.   metroNIDAZOLE (METROGEL) 1 % gel APPLY TO AFFECTED AREA(S)  TOPICALLY DAILY AS NEEDED  FOR ROSACEA   Naphazoline-Pheniramine (OPCON-A OP) Apply 1 drop to eye as needed.   rosuvastatin (CRESTOR) 10 MG tablet TAKE 1 TABLET(10 MG) BY MOUTH DAILY   Study - ORION 4 - inclisiran 300 mg/1.61m or placebo SQ injection (PI-Stuckey) Inject 1.5 mLs (300 mg total) into the skin every 6 (six) months.   Turmeric 500 MG CAPS Take 1 capsule by mouth daily.   Vitamin D, Cholecalciferol, 25 MCG (1000 UT) TABS Take 1 capsule by mouth daily.   [DISCONTINUED] doxycycline (VIBRA-TABS) 100 MG tablet Take 1 tablet (100 mg total) by mouth 2 (two) times daily.   Facility-Administered Encounter Medications as of 07/20/2022  Medication  Study - ORION 4 - inclisiran 300 mg/1.66m or placebo SQ injection (PI-Stuckey)    Allergies as of 07/20/2022 - Review Complete 07/20/2022  Allergen Reaction Noted   Clobetasol  04/26/2022   Adhesive [tape] Rash 06/20/2014   Crestor [rosuvastatin]  04/08/2007   Lipitor [atorvastatin]  04/08/2007   Nexlizet [bempedoic acid-ezetimibe] Itching 10/29/2020   Praluent [alirocumab]  10/14/2019   Repatha [evolocumab]  10/14/2019    Zocor [simvastatin]  04/08/2007    Past Medical History:  Diagnosis Date   Abdominal pain, unspecified site 05/30/2014   Allergic rhinitis 04/08/2007   Qualifier: Diagnosis of  By: JJenny ReichmannMD, JHunt Oris   Allergy    Arthritis    lumbar spine   Atherosclerosis    BACK PAIN, LUMBAR 07/10/2008   Qualifier: Diagnosis of  By: JJenny ReichmannMD, [place] 08/05/2007   Qualifier: Diagnosis of  By: JJenny ReichmannMD, JHunt Oris   BPH (benign prostatic hypertrophy)    Cataract    starting- mild    Cervical radiculopathy 08/01/2017   CKD (chronic kidney disease), stage III ([place]    COLONIC POLYPS, HX OF 08/05/2007   Adenoma polpy per last coonscopy Sep 01, 2006, Dr PHenrene Pastor   Cough 11/12/2009   Qualifier: Diagnosis of  By: JRonnald RampMD, TArvid Right    Depression 04/08/2007   Qualifier: Diagnosis of  By: JJenny ReichmannMD, JHunt Oris   Diverticulosis of colon    DIVERTICULOSIS, COLON 08/05/2007   Qualifier: Diagnosis of  By: JJenny ReichmannMD, J[place] ATOPIC 11/12/2009   Qualifier: Diagnosis of  By: JRonnald RampMD, TArvid Right    HEMORRHOIDS 07/10/2008   Qualifier: Diagnosis of  By: JJenny ReichmannMD, JHunt Oris   History of nephrolithiasis    History of shingles    Hx of colonic polyp    Hyperglycemia 11/19/2014   Hyperlipidemia    Hypertension    some past history    Insomnia    Insomnia, unspecified 04/08/2007   Centricity Description: SYMPTOM, INSOMNIA NOS Qualifier: Diagnosis of  By: JJenny ReichmannMD, JHunt Oris Centricity Description: INSOMNIA-SLEEP DISORDER-UNSPEC Qualifier: Diagnosis of  By: JJenny ReichmannMD, JHunt Oris   KNEE PAIN, LEFT 07/10/2008   Qualifier: Diagnosis of  By: JJenny ReichmannMD, JHunt Oris   Libido, decreased 10/04/2011   Loss of weight 05/30/2014   Multinodular goiter 12/14/2016   Nausea in adult patient 05/30/2014   NEPHROLITHIASIS, HX OF 08/05/2007   Qualifier: Diagnosis of  By: JJenny ReichmannMD, JHunt Oris   RENAL INSUFFICIENCY 09/07/2010   Qualifier: Diagnosis of  By: JJenny ReichmannMD, JHunt Oris   Right leg pain 10/04/2018   [place] 10[place]   TIA  (transient ischemic attack)    Vitiligo 10/31/2012    Past Surgical History:  Procedure Laterality Date   CERVICAL DISC ARTHROPLASTY  12/08/2017   COLONOSCOPY     CYSTOSCOPY WITH URETEROSCOPY AND STENT PLACEMENT Left 07/04/2014   Procedure: CYSTOSCOPY WITH LEFT RETROGRADE PYLEOGRAM, LEFT URETERAL DILATION AND URETEROSCOPY, LASER LITHOTRISPY AND STENT PLACEMENT;  Surgeon: BArdis Hughs MD;  Location: [facility];  Service: Urology;  Laterality: Left;   HOLMIUM LASER APPLICATION N/A 161/12/4313  Procedure:  LASER LITHOTRIPSY;  Surgeon: BArdis Hughs MD;  Location: [facility];  Service: Urology;  Laterality: N/A;   lithotrypsy     x 3   POLYPECTOMY     TONSILLECTOMY      Family History  Problem Relation Age  of Onset   Ulcerative colitis Sister    Colitis Sister    Heart disease Other        Grandparents   Hyperlipidemia Other    Hypertension Other    Heart failure Mother    CAD Mother        died of CAD at age 73   Heart failure Father    CAD Father        died from cardiac arrest at 19   Lupus Paternal Grandfather    Heart failure Maternal Grandfather    Colon cancer Neg Hx    Esophageal cancer Neg Hx    Pancreatic cancer Neg Hx    Stomach cancer Neg Hx    Liver disease Neg Hx    Colon polyps Neg Hx    Rectal cancer Neg Hx    Diabetes Neg Hx     Social History   Socioeconomic History   Marital status: Single    Spouse name: Not on file   Number of children: Not on file   Years of education: Not on file   Highest education level: Not on file  Occupational History   Occupation: IT  Tobacco Use   Smoking status: Former    Packs/day: 0.50    Years: 10.00    Total pack years: 5.00    Types: Cigarettes   Smokeless tobacco: Never   Tobacco comments:    Regular exercise - No  Substance and Sexual Activity   Alcohol use: Not Currently    Alcohol/week: 7.0 standard drinks of alcohol    Types: 7 Cans of beer per week   Drug use: No   Sexual activity: Never   Other Topics Concern   Not on file  Social History Narrative   Works at [employer], divorce, no biological children   Social Determinants of Radio broadcast assistant Strain: Not on file  Food Insecurity: Not on file  Transportation Needs: Not on file  Physical Activity: Not on file  Stress: Not on file  Social Connections: Not on file  Intimate Partner Violence: Not on file      Review of systems: All other review of systems negative except as mentioned in the HPI.   Physical Exam: Vitals:   07/20/22 1028  BP: 96/60  Pulse: (Abnormal) 56   Body mass index is 20.4 kg/m. Gen:      No acute distress HEENT:  sclera anicteric Abd:      soft, non-tender; no palpable masses, no distension Ext:    No edema Neuro: alert and oriented x 3 Psych: normal mood and affect Rectal exam: Normal anal sphincter tone, no external hemorrhoids, no anal rectal tenderness Anoscopy: not performed   Data Reviewed:  Reviewed labs, radiology imaging, old records and pertinent past GI work up   Assessment and Plan/Recommendations:  71 year old very pleasant gentleman with anal fissure Use diltiazem 2% gel small pea-sized amount per rectum 3 times daily for additional 6 to 8 weeks   Advised patient to try Benefiber or any soluble fiber and stop the insoluble fiber to improve bowel habits Use MiraLAX as needed and titrate based on response to have 1-2 soft bowel movement daily  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  Advised patient to follow-up with neurosurgery or sports  medicine for further evaluation and management.  Return in 2 months  The patient was provided an opportunity to ask questions and all were answered. The patient agreed with the  plan and demonstrated an understanding of the instructions.  Damaris Hippo , MD    CC: [name], MD

## 2022-07-20 NOTE — Patient Instructions (Signed)
We have sent a prescription for Diltiazem gel to [facility]. You should apply a pea size amount to your rectum twice daily x 8 weeks.  [facility]'s information is below: Address: [address], [place], [place] [address]  [phone]  *Please DO NOT go directly from our office to pick up this medication! Give the pharmacy 1 day to process the prescription as this is compounded and takes time to make.  We have referred you to pelvic floor therapy. They will contact you with an appt date and time.   Please follow up with Dr. Silverio Decamp in January or February. We do not have a schedule at this time so please call our office to schedule in December.   The [facility] providers would like to encourage you to use [facility] to communicate with providers for non-urgent requests or questions.  Due to long hold times on the telephone, sending your provider a message by [facility] may be a faster and more efficient way to get a response.  Please allow 48 business hours for a response.  Please remember that this is for non-urgent requests.

## 2022-07-21 ENCOUNTER — Encounter: Payer: Self-pay | Admitting: Gastroenterology

## 2022-08-16 ENCOUNTER — Other Ambulatory Visit: Payer: Self-pay

## 2022-08-16 ENCOUNTER — Ambulatory Visit: Payer: Medicare Other | Attending: Gastroenterology | Admitting: Physical Therapy

## 2022-08-16 DIAGNOSIS — R293 Abnormal posture: Secondary | ICD-10-CM | POA: Diagnosis present

## 2022-08-16 DIAGNOSIS — M6281 Muscle weakness (generalized): Secondary | ICD-10-CM | POA: Diagnosis present

## 2022-08-16 DIAGNOSIS — R279 Unspecified lack of coordination: Secondary | ICD-10-CM | POA: Diagnosis present

## 2022-08-16 NOTE — Patient Instructions (Signed)
   BALLOON BREATHING: bring open fist to mouth, blow out like you are blowing up a balloon. Do this instead of straining.

## 2022-08-16 NOTE — Therapy (Signed)
OUTPATIENT PHYSICAL THERAPY MALE PELVIC EVALUATION   Patient Name: Jerry Rios MRN: [id number] DOB:1951/05/07, 71 y.o., male Today's Date: 08/16/2022  END OF SESSION:  PT End of Session - 08/16/22 1231     Visit Number 1    Date for PT Re-Evaluation 11/16/22    Authorization Type Medicare A and B    PT Start Time 0354    PT Stop Time 1310    PT Time Calculation (min) 40 min    Activity Tolerance Patient tolerated treatment well    Behavior During Therapy [facility] for tasks assessed/performed             Past Medical History:  Diagnosis Date   Abdominal pain, unspecified site 05/30/2014   Allergic rhinitis 04/08/2007   Qualifier: Diagnosis of  By: [name] MD, [name]    Allergy    Arthritis    lumbar spine   Atherosclerosis    BACK PAIN, LUMBAR 07/10/2008   Qualifier: Diagnosis of  By: [name] MD, [name]    BENIGN PROSTATIC HYPERTROPHY 08/05/2007   Qualifier: Diagnosis of  By: [name] MD, [name]    BPH (benign prostatic hypertrophy)    Cataract    starting- mild    Cervical radiculopathy 08/01/2017   CKD (chronic kidney disease), stage III ([place])    COLONIC POLYPS, HX OF 08/05/2007   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    Depression 04/08/2007   Qualifier: Diagnosis of  By: [name] MD, [name]    Diverticulosis of colon    DIVERTICULOSIS, COLON 08/05/2007   Qualifier: Diagnosis of  By: [name] MD, [place], ATOPIC 11/12/2009   Qualifier: Diagnosis of  By: [name] MD, Arvid Right.    HEMORRHOIDS 07/10/2008   Qualifier: Diagnosis of  By: [name] MD, [name]    History of nephrolithiasis    History of shingles    Hx of colonic polyp    Hyperglycemia 11/19/2014   Hyperlipidemia    Hypertension    some past history    Insomnia    Insomnia, unspecified 04/08/2007   Centricity Description: SYMPTOM, INSOMNIA NOS Qualifier: Diagnosis of  By: [name] MD, [name]  Centricity Description:  INSOMNIA-SLEEP DISORDER-UNSPEC Qualifier: Diagnosis of  By: [name] MD, [name]    KNEE PAIN, LEFT 07/10/2008   Qualifier: Diagnosis of  By: [name] MD, [name]    Libido, decreased 10/04/2011   Loss of weight 05/30/2014   Multinodular goiter 12/14/2016   Nausea in adult patient 05/30/2014   NEPHROLITHIASIS, HX OF 08/05/2007   Qualifier: Diagnosis of  By: [name] MD, [name]    RENAL INSUFFICIENCY 09/07/2010   Qualifier: Diagnosis of  By: [name] MD, [name]    Right leg pain 10/04/2018   Stroke ([place]) 10/2014   TIA (transient ischemic attack)    Vitiligo 10/31/2012   Past Surgical History:  Procedure Laterality Date   CERVICAL DISC ARTHROPLASTY  12/08/2017   COLONOSCOPY     CYSTOSCOPY WITH URETEROSCOPY AND STENT PLACEMENT Left 07/04/2014   Procedure: CYSTOSCOPY WITH LEFT RETROGRADE PYLEOGRAM, LEFT URETERAL DILATION AND URETEROSCOPY, LASER LITHOTRISPY AND STENT PLACEMENT;  Surgeon: [name], MD;  Location: [facility];  Service: Urology;  Laterality: Left;   HOLMIUM LASER APPLICATION N/A 65/02/8126   Procedure:  LASER LITHOTRIPSY;  Surgeon: [name], MD;  Location: [facility];  Service: Urology;  Laterality: N/A;   lithotrypsy     x 3   POLYPECTOMY     TONSILLECTOMY     Patient Active Problem List   Diagnosis Date Noted   Neoplasm of uncertain behavior of skin 04/26/2022   Cellulitis of left foot 04/26/2022   Skin lesion of back 03/08/2021   Pre-ulcerative corn or callous 03/08/2021   Right foot pain 03/08/2021   Lip lesion 03/08/2021   Mild chronic gastritis 03/02/2021   Neck pain 10/24/2020   Enlarged thyroid 04/02/2020   Throat pain 04/02/2020   Candidiasis, intertrigo 02/29/2020   CKD (chronic kidney disease) stage 3, GFR 30-59 ml/min ([facility]) 11/13/2019   Weight loss 03/07/2019   Right leg pain 10/04/2018   Small intestinal bacterial overgrowth 10/01/2018   Cervical radiculopathy 08/01/2017   Multinodular goiter 12/14/2016   Sudden left hearing loss 03/30/2016   Tinnitus of both  ears 03/30/2016   Hyperglycemia 11/19/2014   TIA (transient ischemic attack) 11/18/2014   Abdominal pain, unspecified site 05/30/2014   Nausea in adult patient 05/30/2014   Loss of weight 05/30/2014   Vitiligo 10/31/2012   Libido, decreased 10/04/2011   Preventative health care 10/01/2011   RENAL INSUFFICIENCY 09/07/2010   ECZEMA, ATOPIC 11/12/2009   HEMORRHOIDS 07/10/2008   BACK PAIN, LUMBAR 07/10/2008   Hyperlipidemia 08/05/2007   DIVERTICULOSIS, COLON 08/05/2007   BENIGN PROSTATIC HYPERTROPHY 08/05/2007   COLONIC POLYPS, HX OF 08/05/2007   NEPHROLITHIASIS, HX OF 08/05/2007   Depression 04/08/2007   Allergic rhinitis 04/08/2007   Insomnia, unspecified 04/08/2007    PCP: [name], MD  REFERRING PROVIDER: Mauri Pole, MD  REFERRING DIAG: K60.2 (ICD-10-CM) - Anal fissure K59.00 (ICD-10-CM) - Difficulty defecating K59.02 (ICD-10-CM) - Constipation, outlet dysfunction  THERAPY DIAG:  Muscle weakness (generalized) - Plan: PT plan of care cert/re-cert  Abnormal posture - Plan: PT plan of care cert/re-cert  Unspecified lack of coordination - Plan: PT plan of care cert/re-cert  Rationale for Evaluation and Treatment: Rehabilitation  ONSET DATE: 6 months ago  SUBJECTIVE:                                                                                                                                                                                           SUBJECTIVE STATEMENT: Pt reports he started having pain at rectum about 6 months ago, saw Dr. Silverio Decamp and found to have anal fissure. The pain has been much better with bowel movements but pain with sitting is still there and does have some constipation still. Usually one time per day but sometimes every other, frequently needs to strain to initiate bowel movement. Does take colace daily  for the past week and this helped slightly, soluble fiber supplement also taking daily except this week with attempting colace.    Fluid intake: water - 1.5L per day, coffee daily one cup and green tea sometimes   PAIN:  Are you having pain? Yes NPRS scale: 2/10 Pain location:  coccyx  Pain type: discomfort Pain description: aching   Aggravating factors: sitting Relieving factors: not sitting  PRECAUTIONS: None  WEIGHT BEARING RESTRICTIONS: No  FALLS:  Has patient fallen in last 6 months? 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, BPH, Cervical radiculopathy,   CKD (chronic kidney disease), stage III; Depression, Hypertension, Stroke  Sexual abuse: No   BOWEL MOVEMENT: Pain with bowel movement: Yes - but better than it was Type of bowel movement:Type (Bristol Stool Scale) 3-4, Frequency daily - every other day, and Strain Yes Fully empty rectum: No Leakage: No Pads: No Fiber supplement: Yes:    URINATION: Pain with urination: Yes Fully empty bladder: No does have enlarged prostate  Stream: Strong Urgency: Yes:   Frequency: every hour Leakage:  after urinating does have dribbling sometimes up to 30 mins (very small amount) Pads: No  INTERCOURSE: Pain with intercourse:  not active    OBJECTIVE:   DIAGNOSTIC FINDINGS:    COGNITION: Overall cognitive status: Within functional limits for tasks assessed     SENSATION: Light touch: Deficits reports bil foot and hand numbness not constantly and Rt hand and Lt foot have cleared per pt.   Proprioception: Appears intact  MUSCLE LENGTH: Bil hamstrings and adductors limited by 25%    POSTURE: rounded shoulders, forward head, and posterior pelvic tilt   LUMBARAROM/PROM:  A/PROM A/PROM  eval  Flexion Limited by 75%  Extension WFL  Right lateral flexion Limited by 50%  Left lateral flexion Limited by  50%  Right rotation Limited by 25%  Left rotation Limited by 25%   (Blank rows = not tested)  LOWER EXTREMITY AROM/PROM:  [facility]  LOWER EXTREMITY MMT:  Bil hips grossly 4+/5, knees and ankles 5/5  PALPATION: GENERAL fascial restrictions noted throughout abdomen but more so on Rt side compared to Lt, no pain TTP at coccyx with mild tension at bil sides              External Perineal Exam deferred              Internal Pelvic Floor deferred Patient confirms identification and approves PT to assess internal pelvic floor and treatment No  PELVIC MMT:   MMT eval  Internal Anal Sphincter   External Anal Sphincter   Puborectalis   Diastasis Recti   (Blank rows = not tested)  TONE: Deferred   TODAY'S TREATMENT:  DATE: 08/16/22   EVAL Examination completed, findings reviewed, pt educated on POC, HEP, and abdominal massage, breathing mechanics, and voiding mechanics. Pt motivated to participate in PT and agreeable to attempt recommendations.     PATIENT EDUCATION:  Education details: [id number] Person educated: Patient Education method: Explanation, Demonstration, Tactile cues, Verbal cues, and Handouts Education comprehension: verbalized understanding and returned demonstration  HOME EXERCISE PROGRAM: [email or web address]  ASSESSMENT:  CLINICAL IMPRESSION: Patient is a 71 y.o. male  who was seen today for physical therapy evaluation and treatment for constipation and recent diagnosis of anal fissure. Pt reports pain from fissure has gotten much better with medication but is still having some constipation and pain with sitting at coccyx.   OBJECTIVE IMPAIRMENTS: decreased coordination, decreased endurance, decreased strength, increased fascial restrictions, increased muscle spasms, impaired flexibility, improper body mechanics, postural dysfunction, and pain.    ACTIVITY LIMITATIONS: sitting and continence  PARTICIPATION LIMITATIONS: community activity  PERSONAL FACTORS: Time since onset of injury/illness/exacerbation and 1 comorbidity: medical history  are also affecting patient's functional outcome.   REHAB POTENTIAL: Good  CLINICAL DECISION MAKING: Stable/uncomplicated  EVALUATION COMPLEXITY: Low   GOALS: Goals reviewed with patient? Yes  SHORT TERM GOALS: Target date: 09/13/22  Pt to be I with HEP.  Baseline: Goal status: INITIAL  2.  Pt to be I with breathing and voiding mechanics for improved bowel habits.  Baseline:  Goal status: INITIAL   LONG TERM GOALS: Target date: 11/16/22  Pt to be I with advanced HEP.  Baseline:  Goal status: INITIAL  2.  Pt will report his BMs are complete due to improved bowel habits and evacuation techniques.  Baseline:  Goal status: INITIAL  3.  Pt to report no more than 1/10 pain due to improved voiding mechanics, muscle lengthening and pelvic stability.  Baseline:  Goal status: INITIAL  4.  Pt will report 1 bowel movement per day  due to improved muscle tone and coordination with BMs.  Baseline:  Goal status: INITIAL   PLAN:  PT FREQUENCY: every other week  PT DURATION:  5 sessions  PLANNED INTERVENTIONS: Therapeutic exercises, Therapeutic activity, Neuromuscular re-education, Patient/Family education, Self Care, Joint mobilization, Dry Needling, Spinal mobilization, Cryotherapy, Moist heat, scar mobilization, Taping, Biofeedback, and Manual therapy  PLAN FOR NEXT SESSION: internal if needed and pt consents, breathing and voiding mechanics, hip and spine and core stretching, go over handouts as needed   Stacy Gardner, PT, DPT 11/21/232:45 PM

## 2022-08-26 ENCOUNTER — Other Ambulatory Visit: Payer: Self-pay | Admitting: Internal Medicine

## 2022-08-29 ENCOUNTER — Ambulatory Visit: Payer: Medicare Other

## 2022-09-05 ENCOUNTER — Ambulatory Visit (INDEPENDENT_AMBULATORY_CARE_PROVIDER_SITE_OTHER): Payer: Medicare Other

## 2022-09-05 ENCOUNTER — Telehealth: Payer: Self-pay

## 2022-09-05 VITALS — Ht 66.75 in | Wt 129.0 lb

## 2022-09-05 DIAGNOSIS — Z0000 Encounter for general adult medical examination without abnormal findings: Secondary | ICD-10-CM

## 2022-09-05 MED ORDER — METRONIDAZOLE 0.75 % EX CREA: TOPICAL_CREAM | Freq: Two times a day (BID) | CUTANEOUS | 2 refills | Status: AC

## 2022-09-05 NOTE — Telephone Encounter (Signed)
Patient would like to inquire about changing his Metrogel to cream due to [facility] costs.  He stated that the gel is costing him $100.00 or more per month and the pharmacy is suggesting cream instead  Do you agree?  Also patient wants to know if there is a difference in the cream and gel?

## 2022-09-05 NOTE — Telephone Encounter (Signed)
Ok done erx to [employer]

## 2022-09-05 NOTE — Patient Instructions (Signed)
Jerry Rios , Thank you for taking time to come for your Medicare Wellness Visit. I appreciate your ongoing commitment to your health goals. Please review the following plan we discussed and let me know if I can assist you in the future.   These are the goals we discussed:  Goals      My goal for 2024 is to get my GI situtation under control and my thyroid checked.        This is a list of the screening recommended for you and due dates:  Health Maintenance  Topic Date Due   Medicare Annual Wellness Visit  09/06/2023   Colon Cancer Screening  01/04/2027   DTaP/Tdap/Td vaccine (4 - Td or Tdap) 10/07/2029   Pneumonia Vaccine  Completed   Flu Shot  Completed   Hepatitis C Screening: USPSTF Recommendation to screen - Ages 18-79 yo.  Completed   HPV Vaccine  Aged Out   COVID-19 Vaccine  Discontinued   Zoster (Shingles) Vaccine  Discontinued    Advanced directives: Yes; patient has a Living Will  Conditions/risks identified: Yes  Next appointment: Follow up in one year for your annual wellness visit.   Preventive Care 60 Years and Older, Male  Preventive care refers to lifestyle choices and visits with your health care provider that can promote health and wellness. What does preventive care include? A yearly physical exam. This is also called an annual well check. Dental exams once or twice a year. Routine eye exams. Ask your health care provider how often you should have your eyes checked. Personal lifestyle choices, including: Daily care of your teeth and gums. Regular physical activity. Eating a healthy diet. Avoiding tobacco and drug use. Limiting alcohol use. Practicing safe sex. Taking low doses of aspirin every day. Taking vitamin and mineral supplements as recommended by your health care provider. What happens during an annual well check? The services and screenings done by your health care provider during your annual well check will depend on your age, overall  health, lifestyle risk factors, and family history of disease. Counseling  Your health care provider may ask you questions about your: Alcohol use. Tobacco use. Drug use. Emotional well-being. Home and relationship well-being. Sexual activity. Eating habits. History of falls. Memory and ability to understand (cognition). Work and work Statistician. Screening  You may have the following tests or measurements: Height, weight, and BMI. Blood pressure. Lipid and cholesterol levels. These may be checked every 5 years, or more frequently if you are over 5 years old. Skin check. Lung cancer screening. You may have this screening every year starting at age 68 if you have a 30-pack-year history of smoking and currently smoke or have quit within the past 15 years. Fecal occult blood test (FOBT) of the stool. You may have this test every year starting at age 86. Flexible sigmoidoscopy or colonoscopy. You may have a sigmoidoscopy every 5 years or a colonoscopy every 10 years starting at age 53. Prostate cancer screening. Recommendations will vary depending on your family history and other risks. Hepatitis C blood test. Hepatitis B blood test. Sexually transmitted disease (STD) testing. Diabetes screening. This is done by checking your blood sugar (glucose) after you have not eaten for a while (fasting). You may have this done every 1-3 years. Abdominal aortic aneurysm (AAA) screening. You may need this if you are a current or former smoker. Osteoporosis. You may be screened starting at age 61 if you are at high risk. Talk with your  health care provider about your test results, treatment options, and if necessary, the need for more tests. Vaccines  Your health care provider may recommend certain vaccines, such as: Influenza vaccine. This is recommended every year. Tetanus, diphtheria, and acellular pertussis (Tdap, Td) vaccine. You may need a Td booster every 10 years. Zoster vaccine. You may  need this after age 63. Pneumococcal 13-valent conjugate (PCV13) vaccine. One dose is recommended after age 7. Pneumococcal polysaccharide (PPSV23) vaccine. One dose is recommended after age 31. Talk to your health care provider about which screenings and vaccines you need and how often you need them. This information is not intended to replace advice given to you by your health care provider. Make sure you discuss any questions you have with your health care provider. Document Released: 10/09/2015 Document Revised: 06/01/2016 Document Reviewed: 07/14/2015 Elsevier Interactive Patient Education  2017 [place] Prevention in the Home Falls can cause injuries. They can happen to people of all ages. There are many things you can do to make your home safe and to help prevent falls. What can I do on the outside of my home? Regularly fix the edges of walkways and driveways and fix any cracks. Remove anything that might make you trip as you walk through a door, such as a raised step or threshold. Trim any bushes or trees on the path to your home. Use bright outdoor lighting. Clear any walking paths of anything that might make someone trip, such as rocks or tools. Regularly check to see if handrails are loose or broken. Make sure that both sides of any steps have handrails. Any raised decks and porches should have guardrails on the edges. Have any leaves, snow, or ice cleared regularly. Use sand or salt on walking paths during winter. Clean up any spills in your garage right away. This includes oil or grease spills. What can I do in the bathroom? Use night lights. Install grab bars by the toilet and in the tub and shower. Do not use towel bars as grab bars. Use non-skid mats or decals in the tub or shower. If you need to sit down in the shower, use a plastic, non-slip stool. Keep the floor dry. Clean up any water that spills on the floor as soon as it happens. Remove soap buildup in  the tub or shower regularly. Attach bath mats securely with double-sided non-slip rug tape. Do not have throw rugs and other things on the floor that can make you trip. What can I do in the bedroom? Use night lights. Make sure that you have a light by your bed that is easy to reach. Do not use any sheets or blankets that are too big for your bed. They should not hang down onto the floor. Have a firm chair that has side arms. You can use this for support while you get dressed. Do not have throw rugs and other things on the floor that can make you trip. What can I do in the kitchen? Clean up any spills right away. Avoid walking on wet floors. Keep items that you use a lot in easy-to-reach places. If you need to reach something above you, use a strong step stool that has a grab bar. Keep electrical cords out of the way. Do not use floor polish or wax that makes floors slippery. If you must use wax, use non-skid floor wax. Do not have throw rugs and other things on the floor that can make you trip. What  can I do with my stairs? Do not leave any items on the stairs. Make sure that there are handrails on both sides of the stairs and use them. Fix handrails that are broken or loose. Make sure that handrails are as long as the stairways. Check any carpeting to make sure that it is firmly attached to the stairs. Fix any carpet that is loose or worn. Avoid having throw rugs at the top or bottom of the stairs. If you do have throw rugs, attach them to the floor with carpet tape. Make sure that you have a light switch at the top of the stairs and the bottom of the stairs. If you do not have them, ask someone to add them for you. What else can I do to help prevent falls? Wear shoes that: Do not have high heels. Have rubber bottoms. Are comfortable and fit you well. Are closed at the toe. Do not wear sandals. If you use a stepladder: Make sure that it is fully opened. Do not climb a closed  stepladder. Make sure that both sides of the stepladder are locked into place. Ask someone to hold it for you, if possible. Clearly mark and make sure that you can see: Any grab bars or handrails. First and last steps. Where the edge of each step is. Use tools that help you move around (mobility aids) if they are needed. These include: Canes. Walkers. Scooters. Crutches. Turn on the lights when you go into a dark area. Replace any light bulbs as soon as they burn out. Set up your furniture so you have a clear path. Avoid moving your furniture around. If any of your floors are uneven, fix them. If there are any pets around you, be aware of where they are. Review your medicines with your doctor. Some medicines can make you feel dizzy. This can increase your chance of falling. Ask your doctor what other things that you can do to help prevent falls. This information is not intended to replace advice given to you by your health care provider. Make sure you discuss any questions you have with your health care provider. Document Released: 07/09/2009 Document Revised: 02/18/2016 Document Reviewed: 10/17/2014 Elsevier Interactive Patient Education  2017 [employer].

## 2022-09-05 NOTE — Telephone Encounter (Signed)
Please call and schedule patient for yearly physical.  Patient is concerned about his thyroid levels.

## 2022-09-05 NOTE — Telephone Encounter (Signed)
Medicare does not pay for physicals as he means it, but ok for appt next available

## 2022-09-05 NOTE — Progress Notes (Addendum)
Virtual Visit via Telephone Note  I connected with  Jerry Rios on 09/05/22 at  2:45 PM EST by telephone and verified that I am speaking with the correct person using two identifiers.  Location: Patient: Home Provider: [place] Persons participating in the virtual visit: [place]   I discussed the limitations, risks, security and privacy concerns of performing an evaluation and management service by telephone and the availability of in person appointments. The patient expressed understanding and agreed to proceed.  Interactive audio and video telecommunications were attempted between this nurse and patient, however failed, due to patient having technical difficulties OR patient did not have access to video capability.  We continued and completed visit with audio only.  Some vital signs may be absent or patient reported.   Jerry Flow, LPN  Subjective:   Jerry Rios is a 71 y.o. male who presents for Medicare Annual/Initial preventive examination.  Review of Systems     Cardiac Risk Factors include: advanced age (>13mn, >>52women);family history of premature cardiovascular disease;dyslipidemia     Objective:    Today's Vitals   09/05/22 1447  Weight: 129 lb (58.5 kg)  Height: 5' 6.75" (1.695 m)  PainSc: 0-No pain   Body mass index is 20.36 kg/m.     09/05/2022    2:49 PM 08/16/2022   12:38 PM 08/22/2020   10:40 PM 01/03/2017   10:24 AM 12/21/2016    4:05 PM 07/19/2016   11:10 PM 12/31/2014    8:10 AM  Advanced Directives  Does Patient Have a Medical Advance Directive? Yes Yes No No No No No  Type of Advance Directive Living will        Does patient want to make changes to medical advance directive?  No - Patient declined       Would patient like information on creating a medical advance directive?      No - patient declined information No - patient declined information    Current Medications (verified) Outpatient  Encounter Medications as of 09/05/2022  Medication Sig   B Complex-C (SUPER B COMPLEX/VITAMIN C) TABS Take 1 tablet by mouth daily.   Barberry-Oreg Grape-Goldenseal (BERBERINE COMPLEX) 200-200-50 MG CAPS Take 1 capsule by mouth daily as needed.   bisacodyl (DULCOLAX) 5 MG EC tablet Take 5 mg by mouth daily as needed.   cetirizine (ZYRTEC) 10 MG tablet Take 10 mg by mouth daily as needed for allergies.   co-enzyme Q-10 30 MG capsule Take 100 mg by mouth as needed.   diltiazem 2 % GEL Apply 1 Application topically 2 (two) times daily. Use pea sized amount per rectum three times a day for 8 weeks   docusate sodium (COLACE) 100 MG capsule Take 100 mg by mouth daily as needed for mild constipation.   fluticasone (FLONASE) 50 MCG/ACT nasal spray USE 2 SPRAYS IN EACH  NOSTRIL DAILY   Magnesium 250 MG TABS Take 1 tablet by mouth daily.   Menaquinone-7 (VITAMIN K2) 100 MCG CAPS Take 1 capsule by mouth as needed.   metroNIDAZOLE (METROGEL) 1 % gel APPLY TOPICALLY TO THE AFFECTED AREA DAILY AS NEEDED FOR ROSACEA   Naphazoline-Pheniramine (OPCON-A OP) Apply 1 drop to eye as needed.   rosuvastatin (CRESTOR) 10 MG tablet TAKE 1 TABLET(10 MG) BY MOUTH DAILY   Study - ORION 4 - inclisiran 300 mg/1.520mor placebo SQ injection (PI-Stuckey) Inject 1.5 mLs (300 mg total) into the skin every 6 (six) months.   Turmeric 500  MG CAPS Take 1 capsule by mouth daily.   Vitamin D, Cholecalciferol, 25 MCG (1000 UT) TABS Take 1 capsule by mouth daily.   Facility-Administered Encounter Medications as of 09/05/2022  Medication   Study - ORION 4 - inclisiran 300 mg/1.52m or placebo SQ injection (PI-Stuckey)    Allergies (verified) Clobetasol, Adhesive [tape], Crestor [rosuvastatin], Lipitor [atorvastatin], Nexlizet [bempedoic acid-ezetimibe], Praluent [alirocumab], Repatha [evolocumab], and Zocor [simvastatin]   History: Past Medical History:  Diagnosis Date   Abdominal pain, unspecified site 05/30/2014   Allergic  rhinitis 04/08/2007   Qualifier: Diagnosis of  By: JJenny ReichmannMD, JHunt Oris   Allergy    Arthritis    lumbar spine   Atherosclerosis    BACK PAIN, LUMBAR 07/10/2008   Qualifier: Diagnosis of  By: JJenny ReichmannMD, JNorth DeLandHYPERTROPHY 08/05/2007   Qualifier: Diagnosis of  By: JJenny ReichmannMD, JHunt Oris   BPH (benign prostatic hypertrophy)    Cataract    starting- mild    Cervical radiculopathy 08/01/2017   CKD (chronic kidney disease), stage III ([place]    COLONIC POLYPS, HX OF 08/05/2007   Adenoma polpy per last coonscopy Sep 01, 2006, Dr PHenrene Pastor   Cough 11/12/2009   Qualifier: Diagnosis of  By: JRonnald RampMD, TArvid Right    Depression 04/08/2007   Qualifier: Diagnosis of  By: JJenny ReichmannMD, JHunt Oris   Diverticulosis of colon    DIVERTICULOSIS, COLON 08/05/2007   Qualifier: Diagnosis of  By: JJenny ReichmannMD, JLealman ATOPIC 11/12/2009   Qualifier: Diagnosis of  By: JRonnald RampMD, TArvid Right    HEMORRHOIDS 07/10/2008   Qualifier: Diagnosis of  By: JJenny ReichmannMD, JHunt Oris   History of nephrolithiasis    History of shingles    Hx of colonic polyp    Hyperglycemia 11/19/2014   Hyperlipidemia    Hypertension    some past history    Insomnia    Insomnia, unspecified 04/08/2007   Centricity Description: SYMPTOM, INSOMNIA NOS Qualifier: Diagnosis of  By: JJenny ReichmannMD, JHunt Oris Centricity Description: INSOMNIA-SLEEP DISORDER-UNSPEC Qualifier: Diagnosis of  By: JJenny ReichmannMD, JHunt Oris   KNEE PAIN, LEFT 07/10/2008   Qualifier: Diagnosis of  By: JJenny ReichmannMD, JHunt Oris   Libido, decreased 10/04/2011   Loss of weight 05/30/2014   Multinodular goiter 12/14/2016   Nausea in adult patient 05/30/2014   NEPHROLITHIASIS, HX OF 08/05/2007   Qualifier: Diagnosis of  By: JJenny ReichmannMD, JHunt Oris   RENAL INSUFFICIENCY 09/07/2010   Qualifier: Diagnosis of  By: JJenny ReichmannMD, JHunt Oris   Right leg pain 10/04/2018   Stroke ([place] 10/2014   TIA (transient ischemic attack)    Vitiligo 10/31/2012   Past Surgical History:  Procedure Laterality Date   CERVICAL DISC  ARTHROPLASTY  12/08/2017   COLONOSCOPY     CYSTOSCOPY WITH URETEROSCOPY AND STENT PLACEMENT Left 07/04/2014   Procedure: CYSTOSCOPY WITH LEFT RETROGRADE PYLEOGRAM, LEFT URETERAL DILATION AND URETEROSCOPY, LASER LITHOTRISPY AND STENT PLACEMENT;  Surgeon: BArdis Hughs MD;  Location: [facility];  Service: Urology;  Laterality: Left;   HOLMIUM LASER APPLICATION N/A [id number]  Procedure:  LASER LITHOTRIPSY;  Surgeon: BArdis Hughs MD;  Location: [facility];  Service: Urology;  Laterality: N/A;   lithotrypsy     x 3   POLYPECTOMY     TONSILLECTOMY     Family History  Problem Relation Age of Onset   Ulcerative colitis Sister  Colitis Sister    Heart disease Other        Grandparents   Hyperlipidemia Other    Hypertension Other    Heart failure Mother    CAD Mother        died of CAD at age 80   Heart failure Father    CAD Father        died from cardiac arrest at 40   Lupus Paternal Grandfather    Heart failure Maternal Grandfather    Colon cancer Neg Hx    Esophageal cancer Neg Hx    Pancreatic cancer Neg Hx    Stomach cancer Neg Hx    Liver disease Neg Hx    Colon polyps Neg Hx    Rectal cancer Neg Hx    Diabetes Neg Hx    Social History   Socioeconomic History   Marital status: Single    Spouse name: Not on file   Number of children: Not on file   Years of education: Not on file   Highest education level: Not on file  Occupational History   Occupation: IT  Tobacco Use   Smoking status: Former    Packs/day: 0.50    Years: 10.00    Total pack years: 5.00    Types: Cigarettes   Smokeless tobacco: Never   Tobacco comments:    Regular exercise - No  Substance and Sexual Activity   Alcohol use: Not Currently    Alcohol/week: 7.0 standard drinks of alcohol    Types: 7 Cans of beer per week   Drug use: No   Sexual activity: Never  Other Topics Concern   Not on file  Social History Narrative   Works at [employer], divorce, no biological children   Social  Determinants of Health   Financial Resource Strain: Low Risk  (09/05/2022)   Overall Financial Resource Strain (CARDIA)    Difficulty of Paying Living Expenses: Not hard at all  Food Insecurity: No Food Insecurity (09/05/2022)   Hunger Vital Sign    Worried About Running Out of Food in the Last Year: Never true    Ran Out of Food in the Last Year: Never true  Transportation Needs: No Transportation Needs (09/05/2022)   PRAPARE - Hydrologist (Medical): No    Lack of Transportation (Non-Medical): No  Physical Activity: Sufficiently Active (09/05/2022)   Exercise Vital Sign    Days of Exercise per Week: 7 days    Minutes of Exercise per Session: 30 min  Stress: No Stress Concern Present (09/05/2022)   [place]    Feeling of Stress : Not at all  Social Connections: Unknown (09/05/2022)   Social Connection and Isolation Panel [NHANES]    Frequency of Communication with Friends and Family: More than three times a week    Frequency of Social Gatherings with Friends and Family: More than three times a week    Attends Religious Services: Patient refused    Marine scientist or Organizations: Yes    Attends Archivist Meetings: Patient refused    Marital Status: Divorced    Tobacco Counseling Counseling given: Not Answered Tobacco comments: Regular exercise - No   Clinical Intake:  Pre-visit preparation completed: Yes  Pain : No/denies pain Pain Score: 0-No pain     BMI - recorded: 20.36 Nutritional Risks: None Diabetes: No  How often do you need to have someone help you  when you read instructions, pamphlets, or other written materials from your doctor or pharmacy?: 1 - Never What is the last grade level you completed in school?: HSG; Bachelor's Degree  Diabetic? No  Interpreter Needed?: No  Information entered by :: Lisette Abu, LPN.   Activities of  Daily Living    09/05/2022    3:06 PM  In your present state of health, do you have any difficulty performing the following activities:  Hearing? 0  Vision? 0  Difficulty concentrating or making decisions? 0  Walking or climbing stairs? 0  Dressing or bathing? 0  Doing errands, shopping? 0  Preparing Food and eating ? N  Using the Toilet? N  In the past six months, have you accidently leaked urine? N  Do you have problems with loss of bowel control? N  Managing your Medications? N  Managing your Finances? N  Housekeeping or managing your Housekeeping? N    Patient Care Team: [name], MD as PCP - General Sueanne Margarita, MD as PCP - Cardiology (Cardiology) Ald[place] as Consulting Physician (Optometry)  Indicate any recent Medical Services you may have received from other than [facility] providers in the past year (date may be approximate).     Assessment:   This is a routine wellness examination for Jerry Rios.  Hearing/Vision screen Hearing Screening - Comments:: Hearing loss in left ear; wears hearing aid. Vision Screening - Comments:: Wears rx glasses - up to date with routine eye exams with [place] issues and exercise activities discussed: Current Exercise Habits: Home exercise routine;Structured exercise class, Type of exercise: walking;treadmill;stretching;strength training/weights;exercise ball;calisthenics;Other - see comments (PT Exercises at home), Time (Minutes): 30, Frequency (Times/Week): 7, Weekly Exercise (Minutes/Week): 210, Intensity: Moderate, Exercise limited by: None identified   Goals Addressed             This Visit's Progress    My goal for 2024 is to get my GI situtation under control and my thyroid checked.        Depression Screen    09/05/2022    2:55 PM 04/26/2022    8:11 AM 10/25/2021    2:49 PM 10/25/2021    2:04 PM 10/23/2020    2:45 PM 10/23/2020    2:03 PM 10/08/2019    2:02 PM  PHQ 2/9 Scores  PHQ - 2  Score 0 0 0 0 0 0 0  PHQ- 9 Score  2         Fall Risk    09/05/2022    2:51 PM 04/26/2022    8:11 AM 10/25/2021    2:49 PM 10/25/2021    2:04 PM 10/23/2020    2:45 PM  Fall Risk   Falls in the past year? 0 0 0 0 0  Number falls in past yr: 0 0 0 0   Injury with Fall? 0 0 0 0   Risk for fall due to : No Fall Risks   No Fall Risks   Follow up Falls prevention discussed   Falls evaluation completed     FALL RISK PREVENTION PERTAINING TO THE HOME:  Any stairs in or around the home? Yes  If so, are there any without handrails? No  Home free of loose throw rugs in walkways, pet beds, electrical cords, etc? Yes  Adequate lighting in your home to reduce risk of falls? Yes   ASSISTIVE DEVICES UTILIZED TO PREVENT FALLS:  Life alert? No  Use of  a cane, walker or w/c? No  Grab bars in the bathroom? No  Shower chair or bench in shower? No  Elevated toilet seat or a handicapped toilet? No   TIMED UP AND GO:  Was the test performed? No . Phone Visit   Cognitive Function:        09/05/2022    3:07 PM  6CIT Screen  What Year? 0 points  What month? 0 points  What time? 0 points  Count back from 20 0 points  Months in reverse 0 points  Repeat phrase 0 points  Total Score 0 points    Immunizations Immunization History  Administered Date(s) Administered   COVID-19, mRNA, vaccine(Comirnaty)12 years and older 06/08/2022   Fluad Quad(high Dose 65+) 06/08/2019   Influenza Whole 06/26/2009   Influenza, Seasonal, Injecte, Preservative Fre 06/26/2013   Influenza,inj,Quad PF,6+ Mos 09/27/2019   Influenza-Unspecified 06/08/2017, 08/10/2018, 07/02/2020, 08/04/2021, 06/17/2022   PFIZER(Purple Top)SARS-COV-2 Vaccination 12/11/2019, 12/31/2019, 07/23/2020, 01/17/2021, 06/08/2021   Pneumococcal Conjugate-13 12/14/2016   Pneumococcal Polysaccharide-23 10/04/2018   Td 09/27/1995, 07/13/2009   Tdap 10/08/2019   Zoster, Live 09/07/2010    TDAP status: Up to date  Flu Vaccine status: Up  to date  Pneumococcal vaccine status: Up to date  Covid-19 vaccine status: Completed vaccines  Qualifies for Shingles Vaccine? Yes   Zostavax completed Yes   Shingrix Completed?: Yes  Screening Tests Health Maintenance  Topic Date Due   Medicare Annual Wellness (AWV)  09/06/2023   COLONOSCOPY (Pts 45-77yr Insurance coverage will need to be confirmed)  01/04/2027   DTaP/Tdap/Td (4 - Td or Tdap) 10/07/2029   Pneumonia Vaccine 72 Years old  Completed   INFLUENZA VACCINE  Completed   Hepatitis C Screening  Completed   HPV VACCINES  Aged Out   COVID-19 Vaccine  Discontinued   Zoster Vaccines- Shingrix  Discontinued    Health Maintenance  There are no preventive care reminders to display for this patient.   Colorectal cancer screening: No longer required.   Lung Cancer Screening: (Low Dose CT Chest recommended if Age 72-80years, 30 pack-year currently smoking OR have quit w/in 15years.) does not qualify.   Lung Cancer Screening Referral: no  Additional Screening:  Hepatitis C Screening: does qualify; Completed 12/11/2015  Vision Screening: Recommended annual ophthalmology exams for early detection of glaucoma and other disorders of the eye. Is the patient up to date with their annual eye exam?  Yes  Who is the provider or what is the name of the office in which the patient attends annual eye exams[place] not established with a provider, would they like to be referred to a provider to establish care? No .   Dental Screening: Recommended annual dental exams for proper oral hygiene  Community Resource Referral / Chronic Care Management: CRR required this visit?  No   CCM required this visit?  No      Plan:     I have personally reviewed and noted the following in the patient's chart:   Medical and social history Use of alcohol, tobacco or illicit drugs  Current medications and supplements including opioid prescriptions. Patient is not currently  taking opioid prescriptions. Functional ability and status Nutritional status Physical activity Advanced directives List of other physicians Hospitalizations, surgeries, and ER visits in previous 12 months Vitals Screenings to include cognitive, depression, and falls Referrals and appointments  In addition, I have reviewed and discussed with patient certain preventive protocols, quality metrics, and best practice  recommendations. A written personalized care plan for preventive services as well as general preventive health recommendations were provided to patient.     Jerry Flow, LPN   [id number]   Nurse Notes: N/A   Medical screening examination/treatment/procedure(s) were performed by non-physician practitioner and as supervising physician I was immediately available for consultation/collaboration.  I agree with above. Lew Dawes, MD

## 2022-09-29 ENCOUNTER — Encounter: Payer: Medicare Other | Admitting: *Deleted

## 2022-09-29 DIAGNOSIS — Z006 Encounter for examination for normal comparison and control in clinical research program: Secondary | ICD-10-CM

## 2022-09-29 MED ORDER — STUDY - ORION 4 - INCLISIRAN 300 MG/1.5 ML OR PLACEBO SQ INJECTION (PI-STUCKEY)
300.0000 mg | INJECTION | SUBCUTANEOUS | Status: DC
  Administered 2022-09-29: 300 mg via SUBCUTANEOUS
  Filled 2022-09-29: qty 1.5

## 2022-09-29 NOTE — Research (Signed)
Patient doing well.  No changes in meds.  No labs needed today Appt for 58mscheduled July 2 with fasting labs Injection given today at 0920 LL abd No reactions to med    Current Outpatient Medications:    metroNIDAZOLE (METROCREAM) 0.75 % cream, Apply topically 2 (two) times daily., Disp: 45 g, Rfl: 2   B Complex-C (SUPER B COMPLEX/VITAMIN C) TABS, Take 1 tablet by mouth daily., Disp: , Rfl:    Barberry-Oreg Grape-Goldenseal (BERBERINE COMPLEX) 200-200-50 MG CAPS, Take 1 capsule by mouth daily as needed., Disp: , Rfl:    bisacodyl (DULCOLAX) 5 MG EC tablet, Take 5 mg by mouth daily as needed., Disp: , Rfl:    cetirizine (ZYRTEC) 10 MG tablet, Take 10 mg by mouth daily as needed for allergies., Disp: , Rfl:    co-enzyme Q-10 30 MG capsule, Take 100 mg by mouth as needed., Disp: , Rfl:    diltiazem 2 % GEL, Apply 1 Application topically 2 (two) times daily. Use pea sized amount per rectum three times a day for 8 weeks, Disp: 30 g, Rfl: 1   docusate sodium (COLACE) 100 MG capsule, Take 100 mg by mouth daily as needed for mild constipation., Disp: , Rfl:    fluticasone (FLONASE) 50 MCG/ACT nasal spray, USE 2 SPRAYS IN EACH  NOSTRIL DAILY, Disp: 48 g, Rfl: 1   Magnesium 250 MG TABS, Take 1 tablet by mouth daily., Disp: , Rfl:    Menaquinone-7 (VITAMIN K2) 100 MCG CAPS, Take 1 capsule by mouth as needed., Disp: , Rfl:    Naphazoline-Pheniramine (OPCON-A OP), Apply 1 drop to eye as needed., Disp: , Rfl:    rosuvastatin (CRESTOR) 10 MG tablet, TAKE 1 TABLET(10 MG) BY MOUTH DAILY, Disp: 90 tablet, Rfl: 3   Study - ORION 4 - inclisiran 300 mg/1.589mor placebo SQ injection (PI-Stuckey), Inject 1.5 mLs (300 mg total) into the skin every 6 (six) months., Disp: 1 mL, Rfl: 0   Turmeric 500 MG CAPS, Take 1 capsule by mouth daily., Disp: , Rfl:    Vitamin D, Cholecalciferol, 25 MCG (1000 UT) TABS, Take 1 capsule by mouth daily., Disp: , Rfl:   Current Facility-Administered Medications:    Study - ORION 4 -  inclisiran 300 mg/1.41m68mr placebo SQ injection (PI-Stuckey), 300 mg, Subcutaneous, Q6 months, StuHillary BowD, 300 mg at 09/29/22 [id number]

## 2022-12-06 ENCOUNTER — Encounter (HOSPITAL_BASED_OUTPATIENT_CLINIC_OR_DEPARTMENT_OTHER): Payer: Self-pay | Admitting: Cardiology

## 2022-12-07 ENCOUNTER — Encounter: Payer: Self-pay | Admitting: Internal Medicine

## 2022-12-13 ENCOUNTER — Ambulatory Visit (INDEPENDENT_AMBULATORY_CARE_PROVIDER_SITE_OTHER): Payer: Medicare Other | Admitting: Internal Medicine

## 2022-12-13 ENCOUNTER — Encounter: Payer: Self-pay | Admitting: Internal Medicine

## 2022-12-13 VITALS — BP 118/66 | HR 53 | Temp 97.5°F | Ht 67.0 in | Wt 141.0 lb

## 2022-12-13 DIAGNOSIS — N401 Enlarged prostate with lower urinary tract symptoms: Secondary | ICD-10-CM

## 2022-12-13 DIAGNOSIS — R739 Hyperglycemia, unspecified: Secondary | ICD-10-CM | POA: Diagnosis not present

## 2022-12-13 DIAGNOSIS — N1831 Chronic kidney disease, stage 3a: Secondary | ICD-10-CM

## 2022-12-13 DIAGNOSIS — E538 Deficiency of other specified B group vitamins: Secondary | ICD-10-CM

## 2022-12-13 DIAGNOSIS — M542 Cervicalgia: Secondary | ICD-10-CM

## 2022-12-13 DIAGNOSIS — N320 Bladder-neck obstruction: Secondary | ICD-10-CM

## 2022-12-13 DIAGNOSIS — E7800 Pure hypercholesterolemia, unspecified: Secondary | ICD-10-CM | POA: Diagnosis not present

## 2022-12-13 DIAGNOSIS — M5441 Lumbago with sciatica, right side: Secondary | ICD-10-CM

## 2022-12-13 DIAGNOSIS — E559 Vitamin D deficiency, unspecified: Secondary | ICD-10-CM

## 2022-12-13 DIAGNOSIS — R338 Other retention of urine: Secondary | ICD-10-CM

## 2022-12-13 MED ORDER — ROSUVASTATIN CALCIUM 10 MG PO TABS: ORAL_TABLET | ORAL | 3 refills | Status: DC

## 2022-12-13 MED ORDER — TADALAFIL 5 MG PO TABS: 5.0000 mg | ORAL_TABLET | Freq: Every day | ORAL | 3 refills | Status: DC

## 2022-12-13 NOTE — Patient Instructions (Addendum)
Please take all new medication as prescribed - the cialis 5 mg per day for the prostate  You will be contacted regarding the referral for: Physical Therapy for the neck and lower back  Please continue all other medications as before, and refills have been done if requested.  Please have the pharmacy call with any other refills you may need.  Please continue your efforts at being more active, low cholesterol diet, and weight control.  You are otherwise up to date with prevention measures today.  Please keep your appointments with your specialists as you may have planned  Please go to the LAB at the blood drawing area for the tests to be done  You will be contacted by phone if any changes need to be made immediately.  Otherwise, you will receive a letter about your results with an explanation, but please check with MyChart first.  Please remember to sign up for MyChart if you have not done so, as this will be important to you in the future with finding out test results, communicating by private email, and scheduling acute appointments online when needed.  Please make an Appointment to return for your 1 year visit, or sooner if needed

## 2022-12-13 NOTE — Progress Notes (Signed)
Patient ID: Jerry Rios, male   DOB: December 06, 1950, 72 y.o.   MRN: [id number]        Chief Complaint: follow up HTN, HLD and hyperglycemia, low vit d, chronic neck and lbp, ckd 3a       HPI:  Jerry Rios is a 72 y.o. male here with c/o persistent  mild worsening neck and lower back pain in the past 3 months, now with worsening stiffness slowing him down, asking for outpt PT referral.  Pt denies chest pain, increased sob or doe, wheezing, orthopnea, PND, increased LE swelling, palpitations, dizziness or syncope.   Pt denies polydipsia, polyuria, or new focal neuro s/s.    Pt denies fever, wt loss, night sweats, loss of appetite, or other constitutional symptoms  Also has worsening mild urinary retention, interested in cialis 5 mg daily.         Wt Readings from Last 3 Encounters:  12/13/22 141 lb (64 kg)  09/29/22 135 lb (61.2 kg)  09/05/22 129 lb (58.5 kg)   BP Readings from Last 3 Encounters:  12/13/22 118/66  09/29/22 126/60  07/20/22 96/60         Past Medical History:  Diagnosis Date   Abdominal pain, unspecified site 05/30/2014   Allergic rhinitis 04/08/2007   Qualifier: Diagnosis of  By: [name] MD, [name]    Allergy    Arthritis    lumbar spine   Atherosclerosis    BACK PAIN, LUMBAR 07/10/2008   Qualifier: Diagnosis of  By: [name] MD, [place] HYPERTROPHY 08/05/2007   Qualifier: Diagnosis of  By: [name] MD, [name]    BPH (benign prostatic hypertrophy)    Cataract    starting- mild    Cervical radiculopathy 08/01/2017   CKD (chronic kidney disease), stage III ([place])    COLONIC POLYPS, HX OF 08/05/2007   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    Depression 04/08/2007   Qualifier: Diagnosis of  By: [name] MD, [name]    Diverticulosis of colon    DIVERTICULOSIS, COLON 08/05/2007   Qualifier: Diagnosis of  By: [name] MD, [place], ATOPIC 11/12/2009   Qualifier:  Diagnosis of  By: [name] MD, Arvid Right.    HEMORRHOIDS 07/10/2008   Qualifier: Diagnosis of  By: [name] MD, [name]    History of nephrolithiasis    History of shingles    Hx of colonic polyp    Hyperglycemia 11/19/2014   Hyperlipidemia    Hypertension    some past history    Insomnia    Insomnia, unspecified 04/08/2007   Centricity Description: SYMPTOM, INSOMNIA NOS Qualifier: Diagnosis of  By: [name] MD, [name]  Centricity Description: INSOMNIA-SLEEP DISORDER-UNSPEC Qualifier: Diagnosis of  By: [name] MD, [name]    KNEE PAIN, LEFT 07/10/2008   Qualifier: Diagnosis of  By: [name] MD, [name]    Libido, decreased 10/04/2011   Loss of weight 05/30/2014   Multinodular goiter 12/14/2016   Nausea in adult patient 05/30/2014   NEPHROLITHIASIS, HX OF 08/05/2007   Qualifier: Diagnosis of  By: [name] MD, [name]    RENAL INSUFFICIENCY 09/07/2010   Qualifier: Diagnosis of  By: [name] MD, [name]    Right leg pain 10/04/2018   Stroke ([place]) 10/2014   TIA (transient ischemic attack)    Vitiligo 10/31/2012  Past Surgical History:  Procedure Laterality Date   CERVICAL DISC ARTHROPLASTY  12/08/2017   COLONOSCOPY     CYSTOSCOPY WITH URETEROSCOPY AND STENT PLACEMENT Left 07/04/2014   Procedure: CYSTOSCOPY WITH LEFT RETROGRADE PYLEOGRAM, LEFT URETERAL DILATION AND URETEROSCOPY, LASER LITHOTRISPY AND STENT PLACEMENT;  Surgeon: [name], MD;  Location: [facility];  Service: Urology;  Laterality: Left;   HOLMIUM LASER APPLICATION N/A [id number]   Procedure:  LASER LITHOTRIPSY;  Surgeon: [name], MD;  Location: [facility];  Service: Urology;  Laterality: N/A;   lithotrypsy     x 3   POLYPECTOMY     TONSILLECTOMY      reports that he has quit smoking. His smoking use included cigarettes. He has a 5.00 pack-year smoking history. He has never used smokeless tobacco. He reports that he does not currently use alcohol after a past usage of about 7.0 standard drinks of alcohol per week. He reports that he does not use  drugs. family history includes CAD in his father and mother; Colitis in his sister; Heart disease in an other family member; Heart failure in his father, maternal grandfather, and mother; Hyperlipidemia in an other family member; Hypertension in an other family member; Lupus in his paternal grandfather; Ulcerative colitis in his sister. Allergies  Allergen Reactions   Clobetasol     Other reaction(s): Unknown   Adhesive [Tape] Rash   Crestor [Rosuvastatin]     REACTION: myalgia   Lipitor [Atorvastatin]     REACTION: myalgia   Nexlizet [Bempedoic Acid-Ezetimibe] Itching    Myalgia in fingers, hands, feet   Praluent [Alirocumab]     ineffective   Repatha [Evolocumab]     ineffective   Zocor [Simvastatin]     REACTION: myalgia   Current Outpatient Medications on File Prior to Visit  Medication Sig Dispense Refill   B Complex-C (SUPER B COMPLEX/VITAMIN C) TABS Take 1 tablet by mouth daily.     Barberry-Oreg Grape-Goldenseal (BERBERINE COMPLEX) 200-200-50 MG CAPS Take 1 capsule by mouth daily as needed.     bisacodyl (DULCOLAX) 5 MG EC tablet Take 5 mg by mouth daily as needed.     cetirizine (ZYRTEC) 10 MG tablet Take 10 mg by mouth daily as needed for allergies.     co-enzyme Q-10 30 MG capsule Take 100 mg by mouth as needed.     diltiazem 2 % GEL Apply 1 Application topically 2 (two) times daily. Use pea sized amount per rectum three times a day for 8 weeks 30 g 1   diltiazem 2 % GEL APPLY A PEA SIZED AMOUNT PER RECTUM THREE TIMES A DAY FOR 8 WEEKS.     docusate sodium (COLACE) 100 MG capsule Take 100 mg by mouth daily as needed for mild constipation.     fluticasone (FLONASE) 50 MCG/ACT nasal spray USE 2 SPRAYS IN EACH  NOSTRIL DAILY 48 g 1   Magnesium 250 MG TABS Take 1 tablet by mouth daily.     Menaquinone-7 (VITAMIN K2) 100 MCG CAPS Take 1 capsule by mouth as needed.     metroNIDAZOLE (METROCREAM) 0.75 % cream Apply topically 2 (two) times daily. 45 g 2   Naphazoline-Pheniramine  (OPCON-A OP) Apply 1 drop to eye as needed.     Study - ORION 4 - inclisiran 300 mg/1.29mL or placebo SQ injection (PI-Stuckey) Inject 1.5 mLs (300 mg total) into the skin every 6 (six) months. 1 mL 0   Turmeric 500 MG CAPS Take 1 capsule by  mouth daily.     Vitamin D, Cholecalciferol, 25 MCG (1000 UT) TABS Take 1 capsule by mouth daily.     Current Facility-Administered Medications on File Prior to Visit  Medication Dose Route Frequency Provider Last Rate Last Admin   Study - ORION 4 - inclisiran 300 mg/1.25mL or placebo SQ injection (PI-Stuckey)  300 mg Subcutaneous Q6 months Hillary Bow, MD   300 mg at 09/29/22 0920        ROS:  All others reviewed and negative.  Objective        PE:  BP 118/66   Pulse (!) 53   Temp (!) 97.5 F (36.4 C) (Oral)   Ht 5\' 7"  (1.702 m)   Wt 141 lb (64 kg)   SpO2 98%   BMI 22.08 kg/m                 Constitutional: Pt appears in NAD               HENT: Head: NCAT.                Right Ear: External ear normal.                 Left Ear: External ear normal.                Eyes: . Pupils are equal, round, and reactive to light. Conjunctivae and EOM are normal               Nose: without d/c or deformity               Neck: Neck supple. Gross normal ROM               Cardiovascular: Normal rate and regular rhythm.                 Pulmonary/Chest: Effort normal and breath sounds without rales or wheezing.                Abd:  Soft, NT, ND, + BS, no organomegaly               Neurological: Pt is alert. At baseline orientation, motor grossly intact               Skin: Skin is warm. No rashes, no other new lesions, LE edema - none               Psychiatric: Pt behavior is normal without agitation   Micro: none  Cardiac tracings I have personally interpreted today:  none  Pertinent Radiological findings (summarize): none   Lab Results  Component Value Date   WBC 5.0 11/04/2021   HGB 13.9 11/04/2021   HCT 41.5 11/04/2021   PLT 218.0 11/04/2021    GLUCOSE 93 04/26/2022   CHOL 362 (H) 04/26/2022   TRIG 65.0 04/26/2022   HDL 112.50 04/26/2022   LDLDIRECT 206.1 11/01/2013   LDLCALC 236 (H) 04/26/2022   ALT 15 04/26/2022   AST 23 04/26/2022   NA 138 04/26/2022   K 4.0 04/26/2022   CL 100 04/26/2022   CREATININE 1.29 04/26/2022   BUN 22 04/26/2022   CO2 29 04/26/2022   TSH 1.47 11/04/2021   PSA 1.21 11/04/2021   HGBA1C 5.9 04/26/2022   Assessment/Plan:  TREA LATNER is a 72 y.o. White or Caucasian [1] male with  has a past medical history of Abdominal pain, unspecified site (05/30/2014), Allergic rhinitis (04/08/2007), Allergy, Arthritis, Atherosclerosis,  BACK PAIN, LUMBAR (07/10/2008), BENIGN PROSTATIC HYPERTROPHY (08/05/2007), BPH (benign prostatic hypertrophy), Cataract, Cervical radiculopathy (08/01/2017), CKD (chronic kidney disease), stage III ([place]), COLONIC POLYPS, HX OF (08/05/2007), Cough (11/12/2009), Depression (04/08/2007), Diverticulosis of colon, DIVERTICULOSIS, COLON (08/05/2007), ECZEMA, ATOPIC (11/12/2009), HEMORRHOIDS (07/10/2008), History of nephrolithiasis, History of shingles, colonic polyp, Hyperglycemia (11/19/2014), Hyperlipidemia, Hypertension, Insomnia, Insomnia, unspecified (04/08/2007), KNEE PAIN, LEFT (07/10/2008), Libido, decreased (10/04/2011), Loss of weight (05/30/2014), Multinodular goiter (12/14/2016), Nausea in adult patient (05/30/2014), NEPHROLITHIASIS, HX OF (08/05/2007), RENAL INSUFFICIENCY (09/07/2010), Right leg pain (10/04/2018), Stroke ([place]) (10/2014), TIA (transient ischemic attack), and Vitiligo (10/31/2012).  CKD (chronic kidney disease) stage 3, GFR 30-59 ml/min Lab Results  Component Value Date   CREATININE 1.29 04/26/2022   Stable overall, cont to avoid nephrotoxins, for f/u lab  Hyperglycemia Lab Results  Component Value Date   HGBA1C 5.9 04/26/2022   Stable, pt to continue current medical treatment  - diet, wt control  Hyperlipidemia Lab Results  Component Value Date   LDLCALC 236 (H)  04/26/2022   Severe uncontroleld, pt to continue current statin crestor 10 mg and f/u lab goal ldl < 70  BACK PAIN, LUMBAR Worsening pain, ok for referral PT  Neck pain Chronic recent worsening -for PT eval  BPH (benign prostatic hyperplasia) With mild worsening symptoms, for cialis 5 mg dialy  Followup: Return in about 1 year (around 12/13/2023).  Cathlean Cower, MD 12/17/2022 4:38 PM [facility]

## 2022-12-17 ENCOUNTER — Encounter: Payer: Self-pay | Admitting: Internal Medicine

## 2022-12-17 NOTE — Assessment & Plan Note (Signed)
With mild worsening symptoms, for cialis 5 mg dialy

## 2022-12-17 NOTE — Assessment & Plan Note (Signed)
Lab Results  Component Value Date   LDLCALC 236 (H) 04/26/2022   Severe uncontroleld, pt to continue current statin crestor 10 mg and f/u lab goal ldl < 70

## 2022-12-17 NOTE — Assessment & Plan Note (Signed)
Lab Results  Component Value Date   HGBA1C 5.9 04/26/2022   Stable, pt to continue current medical treatment  - diet, wt control

## 2022-12-17 NOTE — Assessment & Plan Note (Signed)
Chronic recent worsening -for PT eval

## 2022-12-17 NOTE — Assessment & Plan Note (Signed)
Worsening pain, ok for referral PT

## 2022-12-17 NOTE — Assessment & Plan Note (Addendum)
Lab Results  Component Value Date   CREATININE 1.29 04/26/2022   Stable overall, cont to avoid nephrotoxins, for f/u lab

## 2022-12-21 ENCOUNTER — Other Ambulatory Visit (INDEPENDENT_AMBULATORY_CARE_PROVIDER_SITE_OTHER): Payer: Medicare Other

## 2022-12-21 DIAGNOSIS — E7800 Pure hypercholesterolemia, unspecified: Secondary | ICD-10-CM

## 2022-12-21 DIAGNOSIS — R739 Hyperglycemia, unspecified: Secondary | ICD-10-CM

## 2022-12-21 DIAGNOSIS — E559 Vitamin D deficiency, unspecified: Secondary | ICD-10-CM

## 2022-12-21 DIAGNOSIS — E538 Deficiency of other specified B group vitamins: Secondary | ICD-10-CM

## 2022-12-21 DIAGNOSIS — N320 Bladder-neck obstruction: Secondary | ICD-10-CM

## 2022-12-21 LAB — BASIC METABOLIC PANEL
BUN: 19 mg/dL (ref 6–23)
CO2: 28 mEq/L (ref 19–32)
Calcium: 9.6 mg/dL (ref 8.4–10.5)
Chloride: 101 mEq/L (ref 96–112)
Creatinine, Ser: 1.38 mg/dL (ref 0.40–1.50)
GFR: 51.15 mL/min — ABNORMAL LOW (ref >60.00)
Glucose, Bld: 94 mg/dL (ref 70–99)
Potassium: 3.7 mEq/L (ref 3.5–5.1)
Sodium: 139 mEq/L (ref 135–145)

## 2022-12-21 LAB — URINALYSIS, ROUTINE W REFLEX MICROSCOPIC
Bilirubin Urine: NEGATIVE
Hgb urine dipstick: NEGATIVE
Ketones, ur: NEGATIVE
Leukocytes,Ua: NEGATIVE
Nitrite: NEGATIVE
RBC / HPF: NONE SEEN (ref 0–?)
Specific Gravity, Urine: 1.015 (ref 1.000–1.030)
Total Protein, Urine: NEGATIVE
Urine Glucose: NEGATIVE
Urobilinogen, UA: 0.2 (ref 0.0–1.0)
WBC, UA: NONE SEEN (ref 0–?)
pH: 6 (ref 5.0–8.0)

## 2022-12-21 LAB — CBC WITH DIFFERENTIAL/PLATELET
Basophils Absolute: 0 10*3/uL (ref 0.0–0.1)
Basophils Relative: 0.4 % (ref 0.0–3.0)
Eosinophils Absolute: 0.1 10*3/uL (ref 0.0–0.7)
Eosinophils Relative: 1.7 % (ref 0.0–5.0)
HCT: 40.3 % (ref 39.0–52.0)
Hemoglobin: 13.7 g/dL (ref 13.0–17.0)
Lymphocytes Relative: 38.4 % (ref 12.0–46.0)
Lymphs Abs: 1.8 10*3/uL (ref 0.7–4.0)
MCHC: 34 g/dL (ref 30.0–36.0)
MCV: 91.1 fl (ref 78.0–100.0)
Monocytes Absolute: 0.5 10*3/uL (ref 0.1–1.0)
Monocytes Relative: 10.8 % (ref 3.0–12.0)
Neutro Abs: 2.3 10*3/uL (ref 1.4–7.7)
Neutrophils Relative %: 48.7 % (ref 43.0–77.0)
Platelets: 211 10*3/uL (ref 150.0–400.0)
RBC: 4.42 Mil/uL (ref 4.22–5.81)
RDW: 14.4 % (ref 11.5–15.5)
WBC: 4.6 10*3/uL (ref 4.0–10.5)

## 2022-12-21 LAB — HEPATIC FUNCTION PANEL
ALT: 18 U/L (ref 0–53)
AST: 24 U/L (ref 0–37)
Albumin: 4.3 g/dL (ref 3.5–5.2)
Alkaline Phosphatase: 36 U/L — ABNORMAL LOW (ref 39–117)
Bilirubin, Direct: 0.1 mg/dL (ref 0.0–0.3)
Total Bilirubin: 0.7 mg/dL (ref 0.2–1.2)
Total Protein: 6.6 g/dL (ref 6.0–8.3)

## 2022-12-21 LAB — PSA: PSA: 2.3 ng/mL (ref 0.10–4.00)

## 2022-12-21 LAB — VITAMIN D 25 HYDROXY (VIT D DEFICIENCY, FRACTURES): VITD: 47.04 ng/mL (ref 30.00–100.00)

## 2022-12-21 LAB — HEMOGLOBIN A1C: Hgb A1c MFr Bld: 5.9 % (ref 4.6–6.5)

## 2022-12-21 LAB — VITAMIN B12: Vitamin B-12: 607 pg/mL (ref 211–911)

## 2022-12-21 LAB — TSH: TSH: 2.16 u[IU]/mL (ref 0.35–5.50)

## 2023-01-05 NOTE — Therapy (Signed)
OUTPATIENT PHYSICAL THERAPY THORACOLUMBAR EVALUATION   Patient Name: Jerry Rios MRN: [id number] DOB:08-02-1951, 72 y.o., male Today's Date: 01/10/2023  END OF SESSION:  PT End of Session - 01/10/23 1535     Visit Number 1    Number of Visits 12    Date for PT Re-Evaluation 02/26/23    Authorization Type Medicare A and B    PT Start Time 0935    PT Stop Time 1015    PT Time Calculation (min) 40 min    Activity Tolerance Patient tolerated treatment well    Behavior During Therapy [facility] for tasks assessed/performed             Past Medical History:  Diagnosis Date   Abdominal pain, unspecified site 05/30/2014   Allergic rhinitis 04/08/2007   Qualifier: Diagnosis of  By: Jonny Ruiz MD, Len Blalock    Allergy    Arthritis    lumbar spine   Atherosclerosis    BACK PAIN, LUMBAR 07/10/2008   Qualifier: Diagnosis of  By: Jonny Ruiz MD, Len Blalock    BENIGN PROSTATIC HYPERTROPHY 08/05/2007   Qualifier: Diagnosis of  By: Jonny Ruiz MD, Len Blalock    BPH (benign prostatic hypertrophy)    Cataract    starting- mild    Cervical radiculopathy 08/01/2017   CKD (chronic kidney disease), stage III (HCC)    COLONIC POLYPS, HX OF 08/05/2007   Adenoma polpy per last coonscopy Sep 01, 2006, Dr Marina Goodell    Cough 11/12/2009   Qualifier: Diagnosis of  By: Yetta Barre MD, Bernadene Bell.    Depression 04/08/2007   Qualifier: Diagnosis of  By: Jonny Ruiz MD, Len Blalock    Diverticulosis of colon    DIVERTICULOSIS, COLON 08/05/2007   Qualifier: Diagnosis of  By: Jonny Ruiz MD, Len Blalock    ECZEMA, ATOPIC 11/12/2009   Qualifier: Diagnosis of  By: Yetta Barre MD, Bernadene Bell.    HEMORRHOIDS 07/10/2008   Qualifier: Diagnosis of  By: Jonny Ruiz MD, Len Blalock    History of nephrolithiasis    History of shingles    Hx of colonic polyp    Hyperglycemia 11/19/2014   Hyperlipidemia    Hypertension    some past history    Insomnia    Insomnia, unspecified 04/08/2007   Centricity Description: SYMPTOM, INSOMNIA NOS Qualifier: Diagnosis of  By: Jonny Ruiz MD, Len Blalock  Centricity  Description: INSOMNIA-SLEEP DISORDER-UNSPEC Qualifier: Diagnosis of  By: Jonny Ruiz MD, Len Blalock    KNEE PAIN, LEFT 07/10/2008   Qualifier: Diagnosis of  By: Jonny Ruiz MD, Len Blalock    Libido, decreased 10/04/2011   Loss of weight 05/30/2014   Multinodular goiter 12/14/2016   Nausea in adult patient 05/30/2014   NEPHROLITHIASIS, HX OF 08/05/2007   Qualifier: Diagnosis of  By: Jonny Ruiz MD, Len Blalock    RENAL INSUFFICIENCY 09/07/2010   Qualifier: Diagnosis of  By: Jonny Ruiz MD, Len Blalock    Right leg pain 10/04/2018   Stroke (HCC) 10/2014   TIA (transient ischemic attack)    Vitiligo 10/31/2012   Past Surgical History:  Procedure Laterality Date   CERVICAL DISC ARTHROPLASTY  12/08/2017   COLONOSCOPY     CYSTOSCOPY WITH URETEROSCOPY AND STENT PLACEMENT Left 07/04/2014   Procedure: CYSTOSCOPY WITH LEFT RETROGRADE PYLEOGRAM, LEFT URETERAL DILATION AND URETEROSCOPY, LASER LITHOTRISPY AND STENT PLACEMENT;  Surgeon: Crist Fat, MD;  Location: [facility];  Service: Urology;  Laterality: Left;   HOLMIUM LASER APPLICATION N/A 07/04/2014   Procedure:  LASER LITHOTRIPSY;  Surgeon: Crist Fat, MD;  Location: [facility];  Service: Urology;  Laterality: N/A;   lithotrypsy     x 3   POLYPECTOMY     TONSILLECTOMY     Patient Active Problem List   Diagnosis Date Noted   Neoplasm of uncertain behavior of skin 04/26/2022   Cellulitis of left foot 04/26/2022   Skin lesion of back 03/08/2021   Pre-ulcerative corn or callous 03/08/2021   Right foot pain 03/08/2021   Lip lesion 03/08/2021   Mild chronic gastritis 03/02/2021   Neck pain 10/24/2020   Enlarged thyroid 04/02/2020   Throat pain 04/02/2020   Candidiasis, intertrigo 02/29/2020   CKD (chronic kidney disease) stage 3, GFR 30-59 ml/min 11/13/2019   Weight loss 03/07/2019   Right leg pain 10/04/2018   Small intestinal bacterial overgrowth 10/01/2018   Cervical radiculopathy 08/01/2017   Multinodular goiter 12/14/2016   Sudden left hearing loss 03/30/2016   Tinnitus of  both ears 03/30/2016   Hyperglycemia 11/19/2014   TIA (transient ischemic attack) 11/18/2014   Abdominal pain, unspecified site 05/30/2014   Nausea in adult patient 05/30/2014   Loss of weight 05/30/2014   Vitiligo 10/31/2012   Libido, decreased 10/04/2011   Preventative health care 10/01/2011   ECZEMA, ATOPIC 11/12/2009   HEMORRHOIDS 07/10/2008   BACK PAIN, LUMBAR 07/10/2008   Hyperlipidemia 08/05/2007   DIVERTICULOSIS, COLON 08/05/2007   BPH (benign prostatic hyperplasia) 08/05/2007   COLONIC POLYPS, HX OF 08/05/2007   NEPHROLITHIASIS, HX OF 08/05/2007   Allergic rhinitis 04/08/2007   Insomnia, unspecified 04/08/2007    PCP: Corwin Levins, MD   REFERRING PROVIDER: Corwin Levins, MD   REFERRING DIAG:  M54.2 (ICD-10-CM) - Neck pain  M54.41 (ICD-10-CM) - Low back pain with right-sided sciatica, unspecified back pain laterality, unspecified chronicit    Rationale for Evaluation and Treatment: Rehabilitation  THERAPY DIAG:  Cervicalgia  Abnormal posture  Back pain with left-sided radiculopathy  Muscle weakness (generalized)  ONSET DATE: I have had about a year and I had a 2 level, I am having more pain in the last 3 months  SUBJECTIVE:                                                                                                                                                                                           SUBJECTIVE STATEMENT: Around C7 or T1 have a muscle knot and it is my primary concern.  Dr Danielle Dess March 2019 with disc replacement..  I have sensory tingling/ numbness to LT to ankle.  Neck pain bothers my sleeping and only able to get 4-5 hours of sleep, I had very  specific pain in my  radiculopathy of my LT arm in a specific place  T-1 , C-7. I had TPDN on my neck and that was helpful for 2 hours and it doesn't last. I was at [facility]. And It tried TPDN but I am not interested. I go to the [facility] x a week.    PERTINENT HISTORY:  [employer]  Cervical disc implant for C5/29 November 2017, , Taylors bunion, CKD, Atherosclerosis , BPH, Cervical radiculopathy, diverticulosis, Insomnia,CVA, 10/2014, TIA See medical chart  PAIN:  Are you having pain? Yes: NPRS scale: 2/10 at rest and 4/10 at worst for neck   for back at rest 2/10 and worst 3/10 /10 Pain location: neck LT side over C7/Ti levator mx., back left side Pain description: neck dull, backdull unless I am bending over and doing yard work Aggravating factors: yard work, exercising. Doing too much in one day.    Relieving factors: none  PRECAUTIONS: Adhesive tape  rash  WEIGHT BEARING RESTRICTIONS: No  FALLS:  Has patient fallen in last 6 months? Yes. Number of falls Partial fall when I tripped over garden hose when I was in the driveway in the last month and caught myself   LIVING ENVIRONMENT: Lives with: lives alone Lives in: House/apartment Stairs: Yes: Internal: 14 steps; on left going up and External: 3 steps; on right going up Has following equipment at home: None  OCCUPATION: retired IT  PLOF: Independent hx   PATIENT GOALS: Be free of back pain iwth   NEXT MD VISIT: TBD  OBJECTIVE:   DIAGNOSTIC FINDINGS:  IMPRESSION:2019 1. Degenerative change of the lumbar spine superimposed on borderline congenital canal narrowing. 2. No fracture, malalignment or acute osseous process. 3. Mild canal stenosis L3-4. Neural foraminal narrowing L2-3 through L4-5: Mild to moderate at L3-4 and L4-5. IMPRESSION:2018 Osteoarthritic change at several levels. Rudimentary cervical ribs bilaterally. No fracture or spondylolisthesis  PATIENT SURVEYS:  FOTO  68%  predicted 68%   SCREENING FOR RED FLAGS: Bowel or bladder incontinence: No   COGNITION: Overall cognitive status: Within functional limits for tasks assessed     SENSATION: Tingling/numbness down to ankle LT  MUSCLE LENGTH: Hamstrings: Right 70  deg; Left 67 deg   POSTURE: rounded shoulders, forward head, and  flexed trunk   PALPATION: TTP  CERVICAL ROM:   Active ROM A/PROM (deg) eval  Flexion 50  Extension 54 p!  Right lateral flexion 17p! On LT  Left lateral flexion 20  Right rotation 40  Left rotation 40 1/10 pain   (Blank rows = not tested)  LUMBAR ROM:   AROM eval  Flexion Mid shin fingertips p! On Lt side  Extension 50% p! In LT side  Right lateral flexion Finger tip to 1 inch below knee jt line p! On LT  Left lateral flexion Finger ipt to 1 inch  above knee jt   Right rotation 90%  Left rotation 90%   (Blank rows = not tested)  LOWER EXTREMITY ROM:   all WNL except specified  Active  Right eval Left eval  Hip flexion    Hip extension    Hip abduction    Hip adduction    Hip internal rotation 25 20  Hip external rotation 45 30  Knee flexion    Knee extension    Ankle dorsiflexion    Ankle plantarflexion    Ankle inversion    Ankle eversion     (Blank rows = not tested)  LOWER EXTREMITY MMT:    core  4-/5  MMT Right eval Left eval  Hip flexion 4+ 4+  Hip extension 4 4  Hip abduction 4 4  Hip adduction    Hip internal rotation    Hip external rotation    Knee flexion 5 5  Knee extension 5 5  Ankle dorsiflexion    Ankle plantarflexion    Ankle inversion    Ankle eversion     (Blank rows = not tested)    FUNCTIONAL TESTS:  5 times sit to stand: 10.02 sec 6 minute walk test: TBD   Sustained bridge  GAIT: Distance walked: 150 ft Assistive device utilized: None Level of assistance: Complete Independence Comments: Normal gait   Pt walks daily 30 min and goes to gym 1 x a week for weight training.  TODAY'S TREATMENT:                                                                                                                              DATE: 01-10-23  EVAL    PATIENT EDUCATION:  Education details: Eval  POC  verbally educated about posture Person educated: Patient Education method: Explanation, Demonstration, Tactile cues, and Verbal  cues Education comprehension: verbalized understanding, returned demonstration, verbal cues required, tactile cues required, and needs further education  HOME EXERCISE PROGRAM: TBD  ASSESSMENT:  CLINICAL IMPRESSION: Patient is a 72 y.o. male who was seen today for physical therapy evaluation and treatment for cervical/thoracic/lumbar evaluation. Pt with hx of cervical radiculopathy and disc replacement of C3/4/C-45 and also reports tingling/numbness with L low back pain today in clinic.  Pt is active and walks daily for 30 min and has [employer] in which he does some weight training 1 day a week.  Pt had been in PT and received basic HEP for stenosis.  Pt would like to be able to do yard work and pick up sticks without pain in L neck and  and L  back. He is interested in an HEP and pain management techniques and to pursue [employer]  for core conditioning to improve his current pain level.  Pt will benefit from skilled PT to address impairment and improve functional mobility.  OBJECTIVE IMPAIRMENTS: decreased ROM, decreased strength, improper body mechanics, postural dysfunction, and pain.   ACTIVITY LIMITATIONS: carrying, lifting, bending, sitting, standing, and squatting  PARTICIPATION LIMITATIONS: driving and yard work  PERSONAL FACTORS: [employer] Cervical disc implant for C5/29 November 2017, , Taylors bunion, CKD, Atherosclerosis , BPH, Cervical radiculopathy, diverticulosis, Insomnia,CVA, 10/2014, TIA See medical chart are also affecting patient's functional outcome.   REHAB POTENTIAL: Good  CLINICAL DECISION MAKING: Evolving/moderate complexity  EVALUATION COMPLEXITY: Moderate   GOALS: Goals reviewed with patient? Yes  SHORT TERM GOALS: Target date: 02-09-23  Pt will be independent with initial HEP Baseline:no knowledge Goal status: INITIAL  2.  Report pain  from 4/10 to 2/10 with functional moblity Baseline: 4/10 pain Goal status: INITIAL  3.  Demonstrate understanding of proper  sitting  posture and be more conscious of position and posture throughout the day.  Baseline: Limited knowledge Goal status: INITIAL  4.  Pt will increase AROM by at least 5 degrees with cervical rotation Baseline:  R and L 40 degrees Goal status: INITIAL    LONG TERM GOALS: Target date: 02-26-23  Pt will be independent with advanced HEP Baseline: limited knowledge Goal status: INITIAL  2. Pain will decrease to 1/10 with all functional activities and be able to utilize exercise/movement to control sharpened pain for neck and back Baseline: 4./10 pain on L neck , low back Goal status: INITIAL  3.  Pt will be able to perform yard work duties without exacerbating pain greater than 1/10  Baseline: Pt unable to bend down and pick up sticks without pain in low back Goal status: INITIAL  4.  Pt will be educated on Continuing community based exercise program with progressive strengthening  Baseline[facility] Goal status: INITIAL  5.  Pt will be able to utilized neck pain modulating techniques to decrease pinching pain in left neck Baseline: no knowlege Goal status: INITIAL  6.  Pt will improve bil LE strength to >/= 4+/5 with </= 1/10 pain to promote safety with walking/standing activities Baseline: Pt expressed on time in last 2 months losing balance and catching himself to prevent falls. MMT as in flow chart Goal status: INITIAL  PLAN:  PT FREQUENCY: 1-2x/week  PT DURATION: 6 weeks  PLANNED INTERVENTIONS: Therapeutic exercises, Therapeutic activity, Neuromuscular re-education, Balance training, Gait training, Patient/Family education, Self Care, Joint mobilization, Stair training, Electrical stimulation, Cryotherapy, Moist heat, Taping, Manual therapy, and Re-evaluation.  PLAN FOR NEXT SESSION: issue HEP,  introduce to Pilates  Garen LahLawrie Camala Talwar, PT, ATRIC Certified Exercise Expert for the Aging Adult  01/10/23 3:56 PM Phone: [phone] Fax: [phone]    Garen LahLawrie Cutler Sunday, PT, ATRIC Certified Exercise Expert for the Aging Adult  01/12/23 4:31 PM Phone: [phone] Fax: [phone]

## 2023-01-10 ENCOUNTER — Other Ambulatory Visit: Payer: Self-pay

## 2023-01-10 ENCOUNTER — Ambulatory Visit: Payer: Medicare Other | Attending: Internal Medicine | Admitting: Physical Therapy

## 2023-01-10 DIAGNOSIS — M6281 Muscle weakness (generalized): Secondary | ICD-10-CM | POA: Insufficient documentation

## 2023-01-10 DIAGNOSIS — M5441 Lumbago with sciatica, right side: Secondary | ICD-10-CM | POA: Diagnosis not present

## 2023-01-10 DIAGNOSIS — R279 Unspecified lack of coordination: Secondary | ICD-10-CM | POA: Insufficient documentation

## 2023-01-10 DIAGNOSIS — R293 Abnormal posture: Secondary | ICD-10-CM | POA: Insufficient documentation

## 2023-01-10 DIAGNOSIS — M5410 Radiculopathy, site unspecified: Secondary | ICD-10-CM | POA: Diagnosis present

## 2023-01-10 DIAGNOSIS — M542 Cervicalgia: Secondary | ICD-10-CM | POA: Insufficient documentation

## 2023-01-16 ENCOUNTER — Encounter: Payer: Self-pay | Admitting: Cardiology

## 2023-01-16 ENCOUNTER — Ambulatory Visit: Payer: Medicare Other | Admitting: Cardiology

## 2023-01-16 ENCOUNTER — Ambulatory Visit: Payer: Medicare Other | Attending: Cardiology | Admitting: Cardiology

## 2023-01-16 VITALS — BP 112/62 | HR 53 | Ht 67.0 in | Wt 138.8 lb

## 2023-01-16 DIAGNOSIS — I10 Essential (primary) hypertension: Secondary | ICD-10-CM | POA: Insufficient documentation

## 2023-01-16 DIAGNOSIS — I739 Peripheral vascular disease, unspecified: Secondary | ICD-10-CM | POA: Insufficient documentation

## 2023-01-16 DIAGNOSIS — E7800 Pure hypercholesterolemia, unspecified: Secondary | ICD-10-CM

## 2023-01-16 DIAGNOSIS — R079 Chest pain, unspecified: Secondary | ICD-10-CM | POA: Diagnosis present

## 2023-01-16 DIAGNOSIS — I2510 Atherosclerotic heart disease of native coronary artery without angina pectoris: Secondary | ICD-10-CM | POA: Insufficient documentation

## 2023-01-16 NOTE — Addendum Note (Signed)
Addended by: Luellen Pucker on: 01/16/2023 04:54 PM   Modules accepted: Orders

## 2023-01-16 NOTE — Addendum Note (Signed)
Addended by: Luellen Pucker on: 01/16/2023 04:55 PM   Modules accepted: Orders

## 2023-01-16 NOTE — Patient Instructions (Signed)
Medication Instructions:  Your physician recommends that you continue on your current medications as directed. Please refer to the Current Medication list given to you today.  *If you need a refill on your cardiac medications before your next appointment, please call your pharmacy*   Lab Work: None.  If you have labs (blood work) drawn today and your tests are completely normal, you will receive your results only by: MyChart Message (if you have MyChart) OR A paper copy in the mail If you have any lab test that is abnormal or we need to change your treatment, we will call you to review the results.   Testing/Procedures:  Your physician has requested that you have an ankle brachial index (ABI). During this test an ultrasound and blood pressure cuff are used to evaluate the arteries that supply the arms and legs with blood. Allow thirty minutes for this exam. There are no restrictions or special instructions.    How to Prepare for Your Cardiac PET/CT Stress Test:  1. Please do not take these medications before your test:   Medications that may interfere with the cardiac pharmacological stress agent (ex. nitrates - including erectile dysfunction medications, isosorbide mononitrate, tamulosin or beta-blockers) the day of the exam. (Erectile dysfunction medication should be held for at least 72 hrs prior to test) Theophylline containing medications for 12 hours. Dipyridamole 48 hours prior to the test. Your remaining medications may be taken with water.  2. Nothing to eat or drink, except water, 3 hours prior to arrival time.   NO caffeine/decaffeinated products, or chocolate 12 hours prior to arrival.  3. NO perfume, cologne or lotion  4. Total time is 1 to 2 hours; you may want to bring reading material for the waiting time.  5. Please report to Radiology at the [facility] Main Entrance 30 minutes early for your test.  [address] [place], [place]  [address]  Diabetic Preparation:  Hold oral medications. You may take NPH and Lantus insulin. Do not take Humalog or Humulin R (Regular Insulin) the day of your test. Check blood sugars prior to leaving the house. If able to eat breakfast prior to 3 hour fasting, you may take all medications, including your insulin, Do not worry if you miss your breakfast dose of insulin - start at your next meal.  IF YOU THINK YOU MAY BE PREGNANT, OR ARE NURSING PLEASE INFORM THE TECHNOLOGIST.  In preparation for your appointment, medication and supplies will be purchased.  Appointment availability is limited, so if you need to cancel or reschedule, please call the Radiology Department at [phone]  24 hours in advance to avoid a cancellation fee of $100.00  What to Expect After you Arrive:  Once you arrive and check in for your appointment, you will be taken to a preparation room within the Radiology Department.  A technologist or Nurse will obtain your medical history, verify that you are correctly prepped for the exam, and explain the procedure.  Afterwards,  an IV will be started in your arm and electrodes will be placed on your skin for EKG monitoring during the stress portion of the exam. Then you will be escorted to the PET/CT scanner.  There, staff will get you positioned on the scanner and obtain a blood pressure and EKG.  During the exam, you will continue to be connected to the EKG and blood pressure machines.  A small, safe amount of a radioactive tracer will be injected in your IV to  obtain a series of pictures of your heart along with an injection of a stress agent.    After your Exam:  It is recommended that you eat a meal and drink a caffeinated beverage to counter act any effects of the stress agent.  Drink plenty of fluids for the remainder of the day and urinate frequently for the first couple of hours after the exam.  Your doctor will inform you of your test results within 7-10 business  days.  For questions about your test or how to prepare for your test, please call: Rockwell Alexandria, Cardiac Imaging Nurse Navigator  Larey Brick, Cardiac Imaging Nurse Navigator Office: [phone]    Follow-Up: At [facility], you and your health needs are our priority.  As part of our continuing mission to provide you with exceptional heart care, we have created designated Provider Care Teams.  These Care Teams include your primary Cardiologist (physician) and Advanced Practice Providers (APPs -  Physician Assistants and Nurse Practitioners) who all work together to provide you with the care you need, when you need it.  We recommend signing up for the patient portal called "MyChart".  Sign up information is provided on this After Visit Summary.  MyChart is used to connect with patients for Virtual Visits (Telemedicine).  Patients are able to view lab/test results, encounter notes, upcoming appointments, etc.  Non-urgent messages can be sent to your provider as well.   To learn more about what you can do with MyChart, go to [email or web address].    Your next appointment:   1 year(s)  Provider:   Armanda Magic, MD

## 2023-01-16 NOTE — Progress Notes (Addendum)
Date:  01/16/2023   ID:  BRAXLEY BALANDRAN, DOB [birth year], MRN [id number]  PCP:  Corwin Levins, MD  Cardiologist:   Electrophysiologist:  None   Chief Complaint:  Coronary artery calcium, HLD, HTN  History of Present Illness:    Jerry Rios is a 72 y.o. male with a history of CVA, HTN and hyperlipidemia.  He was seen for routine PE and his PCP ordered a coronary calcium score which was elevated at  684 with calcium noted in all 3 coronary arteries and LM.  He used to smoke for 10 years but quit at age 71.  His mom died of an MI at age 51 and his Dad died from cardiac arrest at 44.  He underwent coronary Ca score for risk assessment showing an elevated score of 684 and Lexiscan myoview showed no ischemia and normal LVF.  Repeat Steffanie Dunn 11/2020 showed no ischemia.   He is here today for followup and is doing well.  He denies any significant SOB, DOE, PND, orthopnea, LE edema, palpitations or syncope. He has noticed that he gets lef fatigue when walking up hills.  He says that he has been having pain in his upper abdomen that feels like a burning and gas.  He was dx with mild gastritis a few years ago but this is different.  He thinks that sometimes it is in his chest but he is very vague in description and he is really not sure where it is.  He says that since he knows he has coronary calcifications he is always worried.  He is compliant with his meds and is tolerating meds with no SE.    Prior CV studies:   The following studies were reviewed today:  EKG performed today showed sinus bradyardia  Coronary calcium CT 10/2018 IMPRESSION: Coronary calcium score of 684. This was 76 nd percentile for age and sex matched control.   Consider f/u perfusion study given extent of calcium seen  Steffanie Dunn 02/2019 Study Highlights    Nuclear stress EF: 57%. There was no ST segment deviation noted during stress. Defect 1: There is a small defect of mild severity present in the  apex location. No ischemia. This is a low risk study. (Known coronary calcification)    Past Medical History:  Diagnosis Date   Abdominal pain, unspecified site 05/30/2014   Allergic rhinitis 04/08/2007   Qualifier: Diagnosis of  By: Jonny Ruiz MD, Len Blalock    Allergy    Arthritis    lumbar spine   Atherosclerosis    BACK PAIN, LUMBAR 07/10/2008   Qualifier: Diagnosis of  By: Jonny Ruiz MD, Len Blalock    BENIGN PROSTATIC HYPERTROPHY 08/05/2007   Qualifier: Diagnosis of  By: Jonny Ruiz MD, Len Blalock    BPH (benign prostatic hypertrophy)    Cataract    starting- mild    Cervical radiculopathy 08/01/2017   CKD (chronic kidney disease), stage III (HCC)    COLONIC POLYPS, HX OF 08/05/2007   Adenoma polpy per last coonscopy Sep 01, 2006, Dr Marina Goodell    Cough 11/12/2009   Qualifier: Diagnosis of  By: Yetta Barre MD, Bernadene Bell.    Depression 04/08/2007   Qualifier: Diagnosis of  By: Jonny Ruiz MD, Len Blalock    Diverticulosis of colon    DIVERTICULOSIS, COLON 08/05/2007   Qualifier: Diagnosis of  By: Jonny Ruiz MD, Len Blalock    ECZEMA, ATOPIC 11/12/2009   Qualifier: Diagnosis of  By: Yetta Barre MD, Bernadene Bell.    HEMORRHOIDS 07/10/2008  Qualifier: Diagnosis of  By: Jonny Ruiz MD, Len Blalock    History of nephrolithiasis    History of shingles    Hx of colonic polyp    Hyperglycemia 11/19/2014   Hyperlipidemia    Hypertension    some past history    Insomnia    Insomnia, unspecified 04/08/2007   Centricity Description: SYMPTOM, INSOMNIA NOS Qualifier: Diagnosis of  By: Jonny Ruiz MD, Len Blalock  Centricity Description: INSOMNIA-SLEEP DISORDER-UNSPEC Qualifier: Diagnosis of  By: Jonny Ruiz MD, Len Blalock    KNEE PAIN, LEFT 07/10/2008   Qualifier: Diagnosis of  By: Jonny Ruiz MD, Len Blalock    Libido, decreased 10/04/2011   Loss of weight 05/30/2014   Multinodular goiter 12/14/2016   Nausea in adult patient 05/30/2014   NEPHROLITHIASIS, HX OF 08/05/2007   Qualifier: Diagnosis of  By: Jonny Ruiz MD, Len Blalock    RENAL INSUFFICIENCY 09/07/2010   Qualifier: Diagnosis of  By: Jonny Ruiz MD, Len Blalock     Right leg pain 10/04/2018   Stroke (HCC) 10/2014   TIA (transient ischemic attack)    Vitiligo 10/31/2012   Past Surgical History:  Procedure Laterality Date   CERVICAL DISC ARTHROPLASTY  12/08/2017   COLONOSCOPY     CYSTOSCOPY WITH URETEROSCOPY AND STENT PLACEMENT Left 07/04/2014   Procedure: CYSTOSCOPY WITH LEFT RETROGRADE PYLEOGRAM, LEFT URETERAL DILATION AND URETEROSCOPY, LASER LITHOTRISPY AND STENT PLACEMENT;  Surgeon: Crist Fat, MD;  Location: [facility];  Service: Urology;  Laterality: Left;   HOLMIUM LASER APPLICATION N/A 07/04/2014   Procedure:  LASER LITHOTRIPSY;  Surgeon: Crist Fat, MD;  Location: [facility];  Service: Urology;  Laterality: N/A;   lithotrypsy     x 3   POLYPECTOMY     TONSILLECTOMY       No outpatient medications have been marked as taking for the 01/16/23 encounter (Office Visit) with Quintella Reichert, MD.   Current Facility-Administered Medications for the 01/16/23 encounter (Office Visit) with Quintella Reichert, MD  Medication   Study - ORION 4 - inclisiran 300 mg/1.44mL or placebo SQ injection (PI-Stuckey)     Allergies:   Clobetasol, Adhesive [tape], Crestor [rosuvastatin], Lipitor [atorvastatin], Nexlizet [bempedoic acid-ezetimibe], Praluent [alirocumab], Repatha [evolocumab], and Zocor [simvastatin]   Social History   Tobacco Use   Smoking status: Former    Packs/day: 0.50    Years: 10.00    Additional pack years: 0.00    Total pack years: 5.00    Types: Cigarettes   Smokeless tobacco: Never   Tobacco comments:    Regular exercise - No  Substance Use Topics   Alcohol use: Not Currently    Alcohol/week: 7.0 standard drinks of alcohol    Types: 7 Cans of beer per week   Drug use: No     Family Hx: The patient's family history includes CAD in his father and mother; Colitis in his sister; Heart disease in an other family member; Heart failure in his father, maternal grandfather, and mother; Hyperlipidemia in an other family member;  Hypertension in an other family member; Lupus in his paternal grandfather; Ulcerative colitis in his sister. There is no history of Colon cancer, Esophageal cancer, Pancreatic cancer, Stomach cancer, Liver disease, Colon polyps, Rectal cancer, or Diabetes.  ROS:   Please see the history of present illness.     All other systems reviewed and are negative.   Labs/Other Tests and Data Reviewed:    Recent Labs: 12/21/2022: ALT 18; BUN 19; Creatinine, Ser 1.38; Hemoglobin 13.7; Platelets 211.0; Potassium 3.7;  Sodium 139; TSH 2.16   Recent Lipid Panel Lab Results  Component Value Date/Time   CHOL 362 (H) 04/26/2022 09:16 AM   CHOL 168 03/23/2020 12:00 AM   TRIG 65.0 04/26/2022 09:16 AM   HDL 112.50 04/26/2022 09:16 AM   HDL 91 03/23/2020 12:00 AM   CHOLHDL 3 04/26/2022 09:16 AM   LDLCALC 236 (H) 04/26/2022 09:16 AM   LDLCALC 69 03/23/2020 12:00 AM   LDLDIRECT 206.1 11/01/2013 09:42 AM    Wt Readings from Last 3 Encounters:  12/13/22 141 lb (64 kg)  09/29/22 135 lb (61.2 kg)  09/05/22 129 lb (58.5 kg)     Objective:    Vital Signs:  There were no vitals taken for this visit.   GEN: Well nourished, well developed in no acute distress HEENT: Normal NECK: No JVD; No carotid bruits LYMPHATICS: No lymphadenopathy CARDIAC:RRR, no murmurs, rubs, gallops RESPIRATORY:  Clear to auscultation without rales, wheezing or rhonchi  ABDOMEN: Soft, non-tender, non-distended MUSCULOSKELETAL:  No edema; No deformity  SKIN: Warm and dry NEUROLOGIC:  Alert and oriented x 3 PSYCHIATRIC:  Normal affect EKG was performed in the office today and showed sinus bradycardia at 53bpm with no ST changes  ASSESSMENT & PLAN:    1.  Coronary artery calcifications  - coronary calcium was noted in all 3 coronary arteries including left main.   -Calcium score high at > 600.   -nuclear stress test 02/2019 showed no ischemia -Lexiscan Myoview on 12/22/2020 showed no ischemia -He has been having some upper  abdominal and what he thinks is chest pain that lasts several hours at a time>>cannot tell if this is anxiety due to worry over known coronary artery calcifications.  He also has a hx of gastritis. -I will get a stress PET CT to rule out ischemia -Shared Decision Making/Informed Consent The risks [chest pain, shortness of breath, cardiac arrhythmias, dizziness, blood pressure fluctuations, myocardial infarction, stroke/transient ischemic attack, nausea, vomiting, allergic reaction, radiation exposure, metallic taste sensation and life-threatening complications (estimated to be 1 in 10,000)], benefits (risk stratification, diagnosing coronary artery disease, treatment guidance) and alternatives of a cardiac PET stress test were discussed in detail with Mr. Laduca and he agrees to proceed. -Continue drug management with Crestor 10 mg daily with as needed refills -he is intolerant to ASA due to gastritis  2.  Hyperlipidemia  -LDL goal is less than 70 -he is intolerant to PCSK9i, statins and doses >  of Crestor daily -Continue prescription management with Crestor 10 mg daily with as needed refills. -He is in the [place] 4 study with inclisiran  3.  Hypertension  -BP controlled on exam today -He has not required any antihypertensive therapy  4.  Leg fatigue -this occurs when walking up hills -check LE ABIs   Medication Adjustments/Labs and Tests Ordered: Current medicines are reviewed at length with the patient today.  Concerns regarding medicines are outlined above.  Tests Ordered: No orders of the defined types were placed in this encounter.  Medication Changes: No orders of the defined types were placed in this encounter.   Disposition:  Follow up in 1 year(s)  Signed, Armanda Magic, MD  01/16/2023 3:21 PM    [facility]

## 2023-01-17 ENCOUNTER — Encounter: Payer: Self-pay | Admitting: Physical Therapy

## 2023-01-17 ENCOUNTER — Ambulatory Visit: Payer: Medicare Other | Admitting: Physical Therapy

## 2023-01-17 DIAGNOSIS — M5410 Radiculopathy, site unspecified: Secondary | ICD-10-CM

## 2023-01-17 DIAGNOSIS — M542 Cervicalgia: Secondary | ICD-10-CM | POA: Diagnosis not present

## 2023-01-17 DIAGNOSIS — R279 Unspecified lack of coordination: Secondary | ICD-10-CM

## 2023-01-17 DIAGNOSIS — M6281 Muscle weakness (generalized): Secondary | ICD-10-CM

## 2023-01-17 DIAGNOSIS — R293 Abnormal posture: Secondary | ICD-10-CM

## 2023-01-17 NOTE — Therapy (Signed)
OUTPATIENT PHYSICAL THERAPY TREATMENT NOTE   Patient Name: Jerry Rios MRN: [id number] DOB:28-Sep-1950, 72 y.o., male Today's Date: 01/17/2023  PCP: Oliver Barre MD   REFERRING PROVIDER: Oliver Barre MD   END OF SESSION:   PT End of Session - 01/17/23 1238     Visit Number 2    Number of Visits 12    Date for PT Re-Evaluation 02/26/23    Authorization Type Medicare A and B    PT Start Time 1235    PT Stop Time 1315    PT Time Calculation (min) 40 min    Activity Tolerance Patient tolerated treatment well    Behavior During Therapy [facility] for tasks assessed/performed             Past Medical History:  Diagnosis Date   Abdominal pain, unspecified site 05/30/2014   Allergic rhinitis 04/08/2007   Qualifier: Diagnosis of  By: Jonny Ruiz MD, Len Blalock    Allergy    Arthritis    lumbar spine   Atherosclerosis    BACK PAIN, LUMBAR 07/10/2008   Qualifier: Diagnosis of  By: Jonny Ruiz MD, Len Blalock    BENIGN PROSTATIC HYPERTROPHY 08/05/2007   Qualifier: Diagnosis of  By: Jonny Ruiz MD, Len Blalock    BPH (benign prostatic hypertrophy)    Cataract    starting- mild    Cervical radiculopathy 08/01/2017   CKD (chronic kidney disease), stage III    COLONIC POLYPS, HX OF 08/05/2007   Adenoma polpy per last coonscopy Sep 01, 2006, Dr Marina Goodell    Cough 11/12/2009   Qualifier: Diagnosis of  By: Yetta Barre MD, Bernadene Bell.    Depression 04/08/2007   Qualifier: Diagnosis of  By: Jonny Ruiz MD, Len Blalock    Diverticulosis of colon    DIVERTICULOSIS, COLON 08/05/2007   Qualifier: Diagnosis of  By: Jonny Ruiz MD, Len Blalock    ECZEMA, ATOPIC 11/12/2009   Qualifier: Diagnosis of  By: Yetta Barre MD, Bernadene Bell.    HEMORRHOIDS 07/10/2008   Qualifier: Diagnosis of  By: Jonny Ruiz MD, Len Blalock    History of nephrolithiasis    History of shingles    Hx of colonic polyp    Hyperglycemia 11/19/2014   Hyperlipidemia    Hypertension    some past history    Insomnia    Insomnia, unspecified 04/08/2007   Centricity Description: SYMPTOM, INSOMNIA NOS Qualifier:  Diagnosis of  By: Jonny Ruiz MD, Len Blalock  Centricity Description: INSOMNIA-SLEEP DISORDER-UNSPEC Qualifier: Diagnosis of  By: Jonny Ruiz MD, Len Blalock    KNEE PAIN, LEFT 07/10/2008   Qualifier: Diagnosis of  By: Jonny Ruiz MD, Len Blalock    Libido, decreased 10/04/2011   Loss of weight 05/30/2014   Multinodular goiter 12/14/2016   Nausea in adult patient 05/30/2014   NEPHROLITHIASIS, HX OF 08/05/2007   Qualifier: Diagnosis of  By: Jonny Ruiz MD, Len Blalock    RENAL INSUFFICIENCY 09/07/2010   Qualifier: Diagnosis of  By: Jonny Ruiz MD, Len Blalock    Right leg pain 10/04/2018   Stroke 10/2014   TIA (transient ischemic attack)    Vitiligo 10/31/2012   Past Surgical History:  Procedure Laterality Date   CERVICAL DISC ARTHROPLASTY  12/08/2017   COLONOSCOPY     CYSTOSCOPY WITH URETEROSCOPY AND STENT PLACEMENT Left 07/04/2014   Procedure: CYSTOSCOPY WITH LEFT RETROGRADE PYLEOGRAM, LEFT URETERAL DILATION AND URETEROSCOPY, LASER LITHOTRISPY AND STENT PLACEMENT;  Surgeon: Crist Fat, MD;  Location: [facility];  Service: Urology;  Laterality: Left;   HOLMIUM LASER APPLICATION N/A 07/04/2014  Procedure:  LASER LITHOTRIPSY;  Surgeon: Crist Fat, MD;  Location: [facility];  Service: Urology;  Laterality: N/A;   lithotrypsy     x 3   POLYPECTOMY     TONSILLECTOMY     Patient Active Problem List   Diagnosis Date Noted   Neoplasm of uncertain behavior of skin 04/26/2022   Cellulitis of left foot 04/26/2022   Skin lesion of back 03/08/2021   Pre-ulcerative corn or callous 03/08/2021   Right foot pain 03/08/2021   Lip lesion 03/08/2021   Mild chronic gastritis 03/02/2021   Neck pain 10/24/2020   Enlarged thyroid 04/02/2020   Throat pain 04/02/2020   Candidiasis, intertrigo 02/29/2020   CKD (chronic kidney disease) stage 3, GFR 30-59 ml/min 11/13/2019   Weight loss 03/07/2019   Right leg pain 10/04/2018   Small intestinal bacterial overgrowth 10/01/2018   Cervical radiculopathy 08/01/2017   Multinodular goiter 12/14/2016   Sudden  left hearing loss 03/30/2016   Tinnitus of both ears 03/30/2016   Hyperglycemia 11/19/2014   TIA (transient ischemic attack) 11/18/2014   Abdominal pain, unspecified site 05/30/2014   Nausea in adult patient 05/30/2014   Loss of weight 05/30/2014   Vitiligo 10/31/2012   Libido, decreased 10/04/2011   Preventative health care 10/01/2011   ECZEMA, ATOPIC 11/12/2009   HEMORRHOIDS 07/10/2008   BACK PAIN, LUMBAR 07/10/2008   Hyperlipidemia 08/05/2007   DIVERTICULOSIS, COLON 08/05/2007   BPH (benign prostatic hyperplasia) 08/05/2007   COLONIC POLYPS, HX OF 08/05/2007   NEPHROLITHIASIS, HX OF 08/05/2007   Allergic rhinitis 04/08/2007   Insomnia, unspecified 04/08/2007    REFERRING DIAG: Neck pain   THERAPY DIAG:  Cervicalgia  Abnormal posture  Back pain with left-sided radiculopathy  Muscle weakness (generalized)  Unspecified lack of coordination  Rationale for Evaluation and Treatment Rehabilitation  PERTINENT HISTORY:    PRECAUTIONS: none   SUBJECTIVE:                                                                                                                                                                                      SUBJECTIVE STATEMENT:  Pt has some min pain in his L neck and L low back .      PAIN:  Are you having pain? Yes: NPRS scale: 2/10 Pain location: L neck and L low back  Pain description: annoying  Aggravating factors: leaning forward, picking up yard , sticks  Relieving factors: not sure    OBJECTIVE: (objective measures completed at initial evaluation unless otherwise dated) DIAGNOSTIC FINDINGS:  IMPRESSION:2019 1. Degenerative change of the lumbar spine superimposed on borderline congenital canal narrowing. 2. No fracture, malalignment or acute osseous process. 3.  Mild canal stenosis L3-4. Neural foraminal narrowing L2-3 through L4-5: Mild to moderate at L3-4 and L4-5. IMPRESSION:2018 Osteoarthritic change at several levels.  Rudimentary cervical ribs bilaterally. No fracture or spondylolisthesis   PATIENT SURVEYS:  FOTO  68%  predicted 68%     SCREENING FOR RED FLAGS: Bowel or bladder incontinence: No     COGNITION: Overall cognitive status: Within functional limits for tasks assessed                          SENSATION: Tingling/numbness down to ankle LT   MUSCLE LENGTH: Hamstrings: Right 70  deg; Left 67 deg     POSTURE: rounded shoulders, forward head, and flexed trunk    PALPATION: TTP  CERVICAL ROM:    Active ROM A/PROM (deg) eval  Flexion 50  Extension 54 p!  Right lateral flexion 17p! On LT  Left lateral flexion 20  Right rotation 40  Left rotation 40 1/10 pain   (Blank rows = not tested)  LUMBAR ROM:    AROM eval  Flexion Mid shin fingertips p! On Lt side  Extension 50% p! In LT side  Right lateral flexion Finger tip to 1 inch below knee jt line p! On LT  Left lateral flexion Finger ipt to 1 inch  above knee jt   Right rotation 90%  Left rotation 90%   (Blank rows = not tested)   LOWER EXTREMITY ROM:   all WNL except specified   Active  Right eval Left eval  Hip flexion      Hip extension      Hip abduction      Hip adduction      Hip internal rotation 25 20  Hip external rotation 45 30  Knee flexion      Knee extension      Ankle dorsiflexion      Ankle plantarflexion      Ankle inversion      Ankle eversion       (Blank rows = not tested)   LOWER EXTREMITY MMT:    core  4-/5   MMT Right eval Left eval  Hip flexion 4+ 4+  Hip extension 4 4  Hip abduction 4 4  Hip adduction      Hip internal rotation      Hip external rotation      Knee flexion 5 5  Knee extension 5 5  Ankle dorsiflexion      Ankle plantarflexion      Ankle inversion      Ankle eversion       (Blank rows = not tested)       FUNCTIONAL TESTS:  5 times sit to stand: 10.02 sec 6 minute walk test: TBD                       Sustained bridge  GAIT: Distance walked: 150  ft Assistive device utilized: None Level of assistance: Complete Independence Comments: Normal gait   Pt walks daily 30 min and goes to gym 1 x a week for weight training.   TODAY'S TREATMENT:       [facility] Adult PT Treatment:                                                DATE: 01/17/23  Therapeutic Exercise: Footwork  Double leg Parallel heels, toes narrow and wide Pilates V heels and toes narrow and wide  2 red 1 blue 1 yellow  Supine arms 1 red 1 Yellow Arcs cervical retraction in supine, at wall and in quadruped Prone scap retraction x 10 with shoulder extension Supine horizontal abduction green band x 15 with chin tuck Bird dog x 10                                                                                                               DATE: 01-10-23  EVAL      PATIENT EDUCATION:  Education details: Eval  POC  verbally educated about posture Person educated: Patient Education method: Programmer, multimedia, Facilities manager, Actor cues, and Verbal cues Education comprehension: verbalized understanding, returned demonstration, verbal cues required, tactile cues required, and needs further education   HOME EXERCISE PROGRAM:  Access Code: [email or web address] URL: [email or web address] Date: 01/17/2023 Prepared by: Karie Mainland  Exercises - Cervical Retraction at Wall  - 1 x daily - 7 x weekly - 2 sets - 10 reps - 10 hold - Supine Deep Neck Flexor Training  - 1 x daily - 7 x weekly - 2 sets - 10 reps - 10 hold - Supine DNF Liftoffs  - 1 x daily - 7 x weekly - 1 sets - 5-10 reps - 10-15 hold - Standing Shoulder Horizontal Abduction with Resistance  - 1 x daily - 7 x weekly - 2 sets - 10 reps - 5-10 hold - Bird Dog  - 1 x daily - 7 x weekly - 2 sets - 10 reps - 5-10 hold  ASSESSMENT:   CLINICAL IMPRESSION: Patient instructed on basic pilates concepts and did have some difficulty maintaining cervical position.  Modified session to provide postural training that he can do at home.   He was only able to hold DNF for about 6 sec before lost position. Mod to max cueing needed for exercises.  Will provide another 2-3 visits for reinforcing concepts.    OBJECTIVE IMPAIRMENTS: decreased ROM, decreased strength, improper body mechanics, postural dysfunction, and pain.    ACTIVITY LIMITATIONS: carrying, lifting, bending, sitting, standing, and squatting   PARTICIPATION LIMITATIONS: driving and yard work   PERSONAL FACTORS: Moby Cervical disc implant for C5/29 November 2017, , Taylors bunion, CKD, Atherosclerosis , BPH, Cervical radiculopathy, diverticulosis, Insomnia,CVA, 10/2014, TIA See medical chart are also affecting patient's functional outcome.    REHAB POTENTIAL: Good   CLINICAL DECISION MAKING: Evolving/moderate complexity   EVALUATION COMPLEXITY: Moderate     GOALS: Goals reviewed with patient? Yes   SHORT TERM GOALS: Target date: 02-09-23   Pt will be independent with initial HEP Baseline:no knowledge Goal status: INITIAL   2.  Report pain  from 4/10 to 2/10 with functional moblity Baseline: 4/10 pain Goal status: INITIAL   3.  Demonstrate understanding of proper sitting posture and be more conscious of position and posture throughout the day.  Baseline: Limited knowledge Goal status: INITIAL   4.  Pt will increase  AROM by at least 5 degrees with cervical rotation Baseline:  R and L 40 degrees Goal status: INITIAL       LONG TERM GOALS: Target date: 02-26-23   Pt will be independent with advanced HEP Baseline: limited knowledge Goal status: INITIAL   2. Pain will decrease to 1/10 with all functional activities and be able to utilize exercise/movement to control sharpened pain for neck and back Baseline: 4./10 pain on L neck , low back Goal status: INITIAL   3.  Pt will be able to perform yard work duties without exacerbating pain greater than 1/10  Baseline: Pt unable to bend down and pick up sticks without pain in low back Goal status: INITIAL    4.  Pt will be educated on Continuing community based exercise program with progressive strengthening  Baseline: a member at [facility] 1 x a week Goal status: INITIAL   5.  Pt will be able to utilized neck pain modulating techniques to decrease pinching pain in left neck Baseline: no knowlege Goal status: INITIAL   6.  Pt will improve bil LE strength to >/= 4+/5 with </= 1/10 pain to promote safety with walking/standing activities Baseline: Pt expressed on time in last 2 months losing balance and catching himself to prevent falls. MMT as in flow chart Goal status: INITIAL   PLAN:   PT FREQUENCY: 1-2x/week   PT DURATION: 6 weeks   PLANNED INTERVENTIONS: Therapeutic exercises, Therapeutic activity, Neuromuscular re-education, Balance training, Gait training, Patient/Family education, Self Care, Joint mobilization, Stair training, Electrical stimulation, Cryotherapy, Moist heat, Taping, Manual therapy, and Re-evaluation.   PLAN FOR NEXT SESSION: check HEP , progress.  Karie Mainland, PT 01/17/23 1:44 PM Phone: [phone] Fax: [phone]

## 2023-01-20 NOTE — Therapy (Unsigned)
OUTPATIENT PHYSICAL THERAPY TREATMENT NOTE   Patient Name: Jerry Rios MRN: [id number] DOB:08-Sep-1951, 72 y.o., male Today's Date: 01/23/2023  PCP: Oliver Barre MD   REFERRING PROVIDER: Oliver Barre MD   END OF SESSION:   PT End of Session - 01/23/23 1321     Visit Number 3    Number of Visits 12    Date for PT Re-Evaluation 02/26/23    Authorization Type Medicare A and B    PT Start Time 1330    PT Stop Time 1415    PT Time Calculation (min) 45 min    Activity Tolerance Patient tolerated treatment well    Behavior During Therapy [facility] for tasks assessed/performed              Past Medical History:  Diagnosis Date   Abdominal pain, unspecified site 05/30/2014   Allergic rhinitis 04/08/2007   Qualifier: Diagnosis of  By: Jonny Ruiz MD, Len Blalock    Allergy    Arthritis    lumbar spine   Atherosclerosis    BACK PAIN, LUMBAR 07/10/2008   Qualifier: Diagnosis of  By: Jonny Ruiz MD, Len Blalock    BENIGN PROSTATIC HYPERTROPHY 08/05/2007   Qualifier: Diagnosis of  By: Jonny Ruiz MD, Len Blalock    BPH (benign prostatic hypertrophy)    Cataract    starting- mild    Cervical radiculopathy 08/01/2017   CKD (chronic kidney disease), stage III (HCC)    COLONIC POLYPS, HX OF 08/05/2007   Adenoma polpy per last coonscopy Sep 01, 2006, Dr Marina Goodell    Cough 11/12/2009   Qualifier: Diagnosis of  By: Yetta Barre MD, Bernadene Bell.    Depression 04/08/2007   Qualifier: Diagnosis of  By: Jonny Ruiz MD, Len Blalock    Diverticulosis of colon    DIVERTICULOSIS, COLON 08/05/2007   Qualifier: Diagnosis of  By: Jonny Ruiz MD, Len Blalock    ECZEMA, ATOPIC 11/12/2009   Qualifier: Diagnosis of  By: Yetta Barre MD, Bernadene Bell.    HEMORRHOIDS 07/10/2008   Qualifier: Diagnosis of  By: Jonny Ruiz MD, Len Blalock    History of nephrolithiasis    History of shingles    Hx of colonic polyp    Hyperglycemia 11/19/2014   Hyperlipidemia    Hypertension    some past history    Insomnia    Insomnia, unspecified 04/08/2007   Centricity Description: SYMPTOM, INSOMNIA NOS  Qualifier: Diagnosis of  By: Jonny Ruiz MD, Len Blalock  Centricity Description: INSOMNIA-SLEEP DISORDER-UNSPEC Qualifier: Diagnosis of  By: Jonny Ruiz MD, Len Blalock    KNEE PAIN, LEFT 07/10/2008   Qualifier: Diagnosis of  By: Jonny Ruiz MD, Len Blalock    Libido, decreased 10/04/2011   Loss of weight 05/30/2014   Multinodular goiter 12/14/2016   Nausea in adult patient 05/30/2014   NEPHROLITHIASIS, HX OF 08/05/2007   Qualifier: Diagnosis of  By: Jonny Ruiz MD, Len Blalock    RENAL INSUFFICIENCY 09/07/2010   Qualifier: Diagnosis of  By: Jonny Ruiz MD, Len Blalock    Right leg pain 10/04/2018   Stroke (HCC) 10/2014   TIA (transient ischemic attack)    Vitiligo 10/31/2012   Past Surgical History:  Procedure Laterality Date   CERVICAL DISC ARTHROPLASTY  12/08/2017   COLONOSCOPY     CYSTOSCOPY WITH URETEROSCOPY AND STENT PLACEMENT Left 07/04/2014   Procedure: CYSTOSCOPY WITH LEFT RETROGRADE PYLEOGRAM, LEFT URETERAL DILATION AND URETEROSCOPY, LASER LITHOTRISPY AND STENT PLACEMENT;  Surgeon: Crist Fat, MD;  Location: [facility];  Service: Urology;  Laterality: Left;   HOLMIUM LASER APPLICATION  N/A 07/04/2014   Procedure:  LASER LITHOTRIPSY;  Surgeon: Crist Fat, MD;  Location: [facility];  Service: Urology;  Laterality: N/A;   lithotrypsy     x 3   POLYPECTOMY     TONSILLECTOMY     Patient Active Problem List   Diagnosis Date Noted   Neoplasm of uncertain behavior of skin 04/26/2022   Cellulitis of left foot 04/26/2022   Skin lesion of back 03/08/2021   Pre-ulcerative corn or callous 03/08/2021   Right foot pain 03/08/2021   Lip lesion 03/08/2021   Mild chronic gastritis 03/02/2021   Neck pain 10/24/2020   Enlarged thyroid 04/02/2020   Throat pain 04/02/2020   Candidiasis, intertrigo 02/29/2020   CKD (chronic kidney disease) stage 3, GFR 30-59 ml/min (HCC) 11/13/2019   Weight loss 03/07/2019   Right leg pain 10/04/2018   Small intestinal bacterial overgrowth 10/01/2018   Cervical radiculopathy 08/01/2017   Multinodular goiter  12/14/2016   Sudden left hearing loss 03/30/2016   Tinnitus of both ears 03/30/2016   Hyperglycemia 11/19/2014   TIA (transient ischemic attack) 11/18/2014   Abdominal pain, unspecified site 05/30/2014   Nausea in adult patient 05/30/2014   Loss of weight 05/30/2014   Vitiligo 10/31/2012   Libido, decreased 10/04/2011   Preventative health care 10/01/2011   ECZEMA, ATOPIC 11/12/2009   HEMORRHOIDS 07/10/2008   BACK PAIN, LUMBAR 07/10/2008   Hyperlipidemia 08/05/2007   DIVERTICULOSIS, COLON 08/05/2007   BPH (benign prostatic hyperplasia) 08/05/2007   COLONIC POLYPS, HX OF 08/05/2007   NEPHROLITHIASIS, HX OF 08/05/2007   Allergic rhinitis 04/08/2007   Insomnia, unspecified 04/08/2007    REFERRING DIAG: Neck pain   THERAPY DIAG:  Cervicalgia  Abnormal posture  Back pain with left-sided radiculopathy  Muscle weakness (generalized)  Unspecified lack of coordination  Rationale for Evaluation and Treatment Rehabilitation  PERTINENT HISTORY:    PRECAUTIONS: none   SUBJECTIVE:                                                                                                                                                                                      SUBJECTIVE STATEMENT:        PAIN:  Are you having pain? Yes: NPRS scale: 2/10 Pain location: L neck and L low back  Pain description: annoying  Aggravating factors: leaning forward, picking up yard , sticks  Relieving factors: not sure    OBJECTIVE: (objective measures completed at initial evaluation unless otherwise dated) DIAGNOSTIC FINDINGS:  IMPRESSION:2019 1. Degenerative change of the lumbar spine superimposed on borderline congenital canal narrowing. 2. No fracture, malalignment or acute osseous process. 3. Mild canal stenosis L3-4. Neural foraminal narrowing L2-3  through L4-5: Mild to moderate at L3-4 and L4-5. IMPRESSION:2018 Osteoarthritic change at several levels. Rudimentary cervical  ribs bilaterally. No fracture or spondylolisthesis   PATIENT SURVEYS:  FOTO  68%  predicted 68%     SCREENING FOR RED FLAGS: Bowel or bladder incontinence: No     COGNITION: Overall cognitive status: Within functional limits for tasks assessed                          SENSATION: Tingling/numbness down to ankle LT   MUSCLE LENGTH: Hamstrings: Right 70  deg; Left 67 deg     POSTURE: rounded shoulders, forward head, and flexed trunk    PALPATION: TTP  CERVICAL ROM:    Active ROM A/PROM (deg) eval  Flexion 50  Extension 54 p!  Right lateral flexion 17p! On LT  Left lateral flexion 20  Right rotation 40  Left rotation 40 1/10 pain   (Blank rows = not tested)  LUMBAR ROM:    AROM eval  Flexion Mid shin fingertips p! On Lt side  Extension 50% p! In LT side  Right lateral flexion Finger tip to 1 inch below knee jt line p! On LT  Left lateral flexion Finger ipt to 1 inch  above knee jt   Right rotation 90%  Left rotation 90%   (Blank rows = not tested)   LOWER EXTREMITY ROM:   all WNL except specified   Active  Right eval Left eval  Hip flexion      Hip extension      Hip abduction      Hip adduction      Hip internal rotation 25 20  Hip external rotation 45 30  Knee flexion      Knee extension      Ankle dorsiflexion      Ankle plantarflexion      Ankle inversion      Ankle eversion       (Blank rows = not tested)   LOWER EXTREMITY MMT:    core  4-/5   MMT Right eval Left eval  Hip flexion 4+ 4+  Hip extension 4 4  Hip abduction 4 4  Hip adduction      Hip internal rotation      Hip external rotation      Knee flexion 5 5  Knee extension 5 5  Ankle dorsiflexion      Ankle plantarflexion      Ankle inversion      Ankle eversion       (Blank rows = not tested)       FUNCTIONAL TESTS:  5 times sit to stand: 10.02 sec 6 minute walk test: TBD                       Sustained bridge  GAIT: Distance walked: 150 ft Assistive device utilized:  None Level of assistance: Complete Independence Comments: Normal gait   Pt walks daily 30 min and goes to gym 1 x a week for weight training.   TODAY'S TREATMENT:      [facility] Adult PT Treatment:                                                DATE: 01/23/23 Therapeutic Exercise: UBE for 5 min , 2.5 min  each direction, corrected posture  Cervical Retraction at Wall 10 sec x 10  Standing Shoulder Horizontal Abduction with Resistance green band  x 10 head on wall, then another set off wall  row green band narrow and wide in doorway x 15 each , chin tuck Extension bilateral green band  Rhomboid stretch , tennis ball for self massage to help medial scapular border  Supine cervical rotation, flex/ext with ball under head  Supine Deep Neck Flexor Training   Supine DNF Liftoffs   Supine green band ER/IR with head press, narrow grip overhead  x 10  Bird Dog  UE and LE cues for head position  Upper trunk rotation x  5 each side  Green band rotation/row Manual Therapy:  Tr P to L rhomboid and L cervicals, upper trap and levator   Self Care: Tennis ball for soft tissue release   Posture and chin position, neck position   [facility] Adult PT Treatment:                                                DATE: 01/17/23 Therapeutic Exercise: Footwork  Double leg Parallel heels, toes narrow and wide Pilates V heels and toes narrow and wide  2 red 1 blue 1 yellow  Supine arms 1 red 1 Yellow Arcs cervical retraction in supine, at wall and in quadruped Prone scap retraction x 10 with shoulder extension Supine horizontal abduction green band x 15 with chin tuck Bird dog x 10                                                                                                               DATE: 01-10-23  EVAL      PATIENT EDUCATION:  Education details: Eval  POC  verbally educated about posture Person educated: Patient Education method: Programmer, multimedia, Facilities manager, Actor cues, and Verbal cues Education  comprehension: verbalized understanding, returned demonstration, verbal cues required, tactile cues required, and needs further education   HOME EXERCISE PROGRAM:  Access Code: [email or web address] URL: [email or web address] Date: 01/17/2023 Prepared by: Karie Mainland  Exercises - Cervical Retraction at Wall  - 1 x daily - 7 x weekly - 2 sets - 10 reps - 10 hold - Supine Deep Neck Flexor Training  - 1 x daily - 7 x weekly - 2 sets - 10 reps - 10 hold - Supine DNF Liftoffs  - 1 x daily - 7 x weekly - 1 sets - 5-10 reps - 10-15 hold - Standing Shoulder Horizontal Abduction with Resistance  - 1 x daily - 7 x weekly - 2 sets - 10 reps - 5-10 hold - Bird Dog  - 1 x daily - 7 x weekly - 2 sets - 10 reps - 5-10 hold  ASSESSMENT:   CLINICAL IMPRESSION: Patient without much pain at rest.  He continues to have back and neck pain with certain activities including looking  down, bending.  He did have trigger points in his cervicals and periscapular area, able to address this with tennis ball on the wall.  Will continue to reinforce concepts of cervical stabilization and deep neck flexor strength as we incorporate lifting and upper body strength.      OBJECTIVE IMPAIRMENTS: decreased ROM, decreased strength, improper body mechanics, postural dysfunction, and pain.    ACTIVITY LIMITATIONS: carrying, lifting, bending, sitting, standing, and squatting   PARTICIPATION LIMITATIONS: driving and yard work   PERSONAL FACTORS: [employer]y Cervical disc implant for C5/29 November 2017, , Taylors bunion, CKD, Atherosclerosis , BPH, Cervical radiculopathy, diverticulosis, Insomnia,CVA, 10/2014, TIA See medical chart are also affecting patient's functional outcome.    REHAB POTENTIAL: Good   CLINICAL DECISION MAKING: Evolving/moderate complexity   EVALUATION COMPLEXITY: Moderate     GOALS: Goals reviewed with patient? Yes   SHORT TERM GOALS: Target date: 02-09-23   Pt will be independent with initial  HEP Baseline:no knowledge Goal status: INITIAL   2.  Report pain  from 4/10 to 2/10 with functional moblity Baseline: 4/10 pain Goal status: INITIAL   3.  Demonstrate understanding of proper sitting posture and be more conscious of position and posture throughout the day.  Baseline: Limited knowledge Goal status: INITIAL   4.  Pt will increase AROM by at least 5 degrees with cervical rotation Baseline:  R and L 40 degrees Goal status: INITIAL       LONG TERM GOALS: Target date: 02-26-23   Pt will be independent with advanced HEP Baseline: limited knowledge Goal status: INITIAL   2. Pain will decrease to 1/10 with all functional activities and be able to utilize exercise/movement to control sharpened pain for neck and back Baseline: 4./10 pain on L neck , low back Goal status: INITIAL   3.  Pt will be able to perform yard work duties without exacerbating pain greater than 1/10  Baseline: Pt unable to bend down and pick up sticks without pain in low back Goal status: INITIAL   4.  Pt will be educated on Continuing community based exercise program with progressive strengthening  Baseline: a member at [facility] 1 x a week Goal status: INITIAL   5.  Pt will be able to utilized neck pain modulating techniques to decrease pinching pain in left neck Baseline: no knowlege Goal status: INITIAL   6.  Pt will improve bil LE strength to >/= 4+/5 with </= 1/10 pain to promote safety with walking/standing activities Baseline: Pt expressed on time in last 2 months losing balance and catching himself to prevent falls. MMT as in flow chart Goal status: INITIAL   PLAN:   PT FREQUENCY: 1-2x/week   PT DURATION: 6 weeks   PLANNED INTERVENTIONS: Therapeutic exercises, Therapeutic activity, Neuromuscular re-education, Balance training, Gait training, Patient/Family education, Self Care, Joint mobilization, Stair training, Electrical stimulation, Cryotherapy, Moist heat, Taping, Manual therapy, and  Re-evaluation.   PLAN FOR NEXT SESSION: check HEP , progress.   Karie Mainland, PT 01/23/23 4:41 PM Phone: [phone] Fax: [phone]

## 2023-01-23 ENCOUNTER — Ambulatory Visit: Payer: Medicare Other | Admitting: Physical Therapy

## 2023-01-23 ENCOUNTER — Encounter: Payer: Self-pay | Admitting: Physical Therapy

## 2023-01-23 DIAGNOSIS — M542 Cervicalgia: Secondary | ICD-10-CM

## 2023-01-23 DIAGNOSIS — M6281 Muscle weakness (generalized): Secondary | ICD-10-CM

## 2023-01-23 DIAGNOSIS — R293 Abnormal posture: Secondary | ICD-10-CM

## 2023-01-23 DIAGNOSIS — M5410 Radiculopathy, site unspecified: Secondary | ICD-10-CM

## 2023-01-23 DIAGNOSIS — R279 Unspecified lack of coordination: Secondary | ICD-10-CM

## 2023-01-23 NOTE — Addendum Note (Signed)
Addended by: Quintella Reichert on: 01/23/2023 02:35 PM   Modules accepted: Orders

## 2023-01-25 ENCOUNTER — Other Ambulatory Visit (HOSPITAL_COMMUNITY): Payer: Self-pay | Admitting: Cardiology

## 2023-01-25 DIAGNOSIS — I739 Peripheral vascular disease, unspecified: Secondary | ICD-10-CM

## 2023-01-31 ENCOUNTER — Ambulatory Visit: Payer: Medicare Other | Admitting: Physical Therapy

## 2023-02-03 NOTE — Therapy (Unsigned)
OUTPATIENT PHYSICAL THERAPY TREATMENT NOTE   Patient Name: Jerry Rios MRN: [id number] DOB:[birth year], 72 y.o., male Today's Date: 02/06/2023  PCP: Oliver Barre MD   REFERRING PROVIDER: Oliver Barre MD   END OF SESSION:   PT End of Session - 02/06/23 0853     Visit Number 4    Number of Visits 12    Date for PT Re-Evaluation 02/26/23    Authorization Type Medicare A and B    PT Start Time 0850    PT Stop Time 0930    PT Time Calculation (min) 40 min    Activity Tolerance Patient tolerated treatment well    Behavior During Therapy [facility] for tasks assessed/performed               Past Medical History:  Diagnosis Date   Abdominal pain, unspecified site 05/30/2014   Allergic rhinitis 04/08/2007   Qualifier: Diagnosis of  By: Jonny Ruiz MD, Len Blalock    Allergy    Arthritis    lumbar spine   Atherosclerosis    BACK PAIN, LUMBAR 07/10/2008   Qualifier: Diagnosis of  By: Jonny Ruiz MD, Len Blalock    BENIGN PROSTATIC HYPERTROPHY 08/05/2007   Qualifier: Diagnosis of  By: Jonny Ruiz MD, Len Blalock    BPH (benign prostatic hypertrophy)    Cataract    starting- mild    Cervical radiculopathy 08/01/2017   CKD (chronic kidney disease), stage III (HCC)    COLONIC POLYPS, HX OF 08/05/2007   Adenoma polpy per last coonscopy Sep 01, 2006, Dr Marina Goodell    Cough 11/12/2009   Qualifier: Diagnosis of  By: Yetta Barre MD, Bernadene Bell.    Depression 04/08/2007   Qualifier: Diagnosis of  By: Jonny Ruiz MD, Len Blalock    Diverticulosis of colon    DIVERTICULOSIS, COLON 08/05/2007   Qualifier: Diagnosis of  By: Jonny Ruiz MD, Len Blalock    ECZEMA, ATOPIC 11/12/2009   Qualifier: Diagnosis of  By: Yetta Barre MD, Bernadene Bell.    HEMORRHOIDS 07/10/2008   Qualifier: Diagnosis of  By: Jonny Ruiz MD, Len Blalock    History of nephrolithiasis    History of shingles    Hx of colonic polyp    Hyperglycemia 11/19/2014   Hyperlipidemia    Hypertension    some past history    Insomnia    Insomnia, unspecified 04/08/2007   Centricity Description: SYMPTOM, INSOMNIA NOS  Qualifier: Diagnosis of  By: Jonny Ruiz MD, Len Blalock  Centricity Description: INSOMNIA-SLEEP DISORDER-UNSPEC Qualifier: Diagnosis of  By: Jonny Ruiz MD, Len Blalock    KNEE PAIN, LEFT 07/10/2008   Qualifier: Diagnosis of  By: Jonny Ruiz MD, Len Blalock    Libido, decreased 10/04/2011   Loss of weight 05/30/2014   Multinodular goiter 12/14/2016   Nausea in adult patient 05/30/2014   NEPHROLITHIASIS, HX OF 08/05/2007   Qualifier: Diagnosis of  By: Jonny Ruiz MD, Len Blalock    RENAL INSUFFICIENCY 09/07/2010   Qualifier: Diagnosis of  By: Jonny Ruiz MD, Len Blalock    Right leg pain 10/04/2018   Stroke (HCC) 10/2014   TIA (transient ischemic attack)    Vitiligo 10/31/2012   Past Surgical History:  Procedure Laterality Date   CERVICAL DISC ARTHROPLASTY  12/08/2017   COLONOSCOPY     CYSTOSCOPY WITH URETEROSCOPY AND STENT PLACEMENT Left 07/04/2014   Procedure: CYSTOSCOPY WITH LEFT RETROGRADE PYLEOGRAM, LEFT URETERAL DILATION AND URETEROSCOPY, LASER LITHOTRISPY AND STENT PLACEMENT;  Surgeon: Crist Fat, MD;  Location: [facility];  Service: Urology;  Laterality: Left;   HOLMIUM LASER  APPLICATION N/A 07/04/2014   Procedure:  LASER LITHOTRIPSY;  Surgeon: Crist Fat, MD;  Location: [facility];  Service: Urology;  Laterality: N/A;   lithotrypsy     x 3   POLYPECTOMY     TONSILLECTOMY     Patient Active Problem List   Diagnosis Date Noted   Neoplasm of uncertain behavior of skin 04/26/2022   Cellulitis of left foot 04/26/2022   Skin lesion of back 03/08/2021   Pre-ulcerative corn or callous 03/08/2021   Right foot pain 03/08/2021   Lip lesion 03/08/2021   Mild chronic gastritis 03/02/2021   Neck pain 10/24/2020   Enlarged thyroid 04/02/2020   Throat pain 04/02/2020   Candidiasis, intertrigo 02/29/2020   CKD (chronic kidney disease) stage 3, GFR 30-59 ml/min (HCC) 11/13/2019   Weight loss 03/07/2019   Right leg pain 10/04/2018   Small intestinal bacterial overgrowth 10/01/2018   Cervical radiculopathy 08/01/2017   Multinodular goiter  12/14/2016   Sudden left hearing loss 03/30/2016   Tinnitus of both ears 03/30/2016   Hyperglycemia 11/19/2014   TIA (transient ischemic attack) 11/18/2014   Abdominal pain, unspecified site 05/30/2014   Nausea in adult patient 05/30/2014   Loss of weight 05/30/2014   Vitiligo 10/31/2012   Libido, decreased 10/04/2011   Preventative health care 10/01/2011   ECZEMA, ATOPIC 11/12/2009   HEMORRHOIDS 07/10/2008   BACK PAIN, LUMBAR 07/10/2008   Hyperlipidemia 08/05/2007   DIVERTICULOSIS, COLON 08/05/2007   BPH (benign prostatic hyperplasia) 08/05/2007   COLONIC POLYPS, HX OF 08/05/2007   NEPHROLITHIASIS, HX OF 08/05/2007   Allergic rhinitis 04/08/2007   Insomnia, unspecified 04/08/2007    REFERRING DIAG: Neck pain   THERAPY DIAG:  Cervicalgia  Abnormal posture  Back pain with left-sided radiculopathy  Muscle weakness (generalized)  Unspecified lack of coordination  Rationale for Evaluation and Treatment Rehabilitation  PERTINENT HISTORY:    PRECAUTIONS: none   SUBJECTIVE:                                                                                                                                                                                      SUBJECTIVE STATEMENT:        PAIN:  Are you having pain? Yes: NPRS scale: 2/10 Pain location: L neck and L low back  Pain description: annoying  Aggravating factors: leaning forward, picking up yard , sticks  Relieving factors: not sure    OBJECTIVE: (objective measures completed at initial evaluation unless otherwise dated) DIAGNOSTIC FINDINGS:  IMPRESSION:2019 1. Degenerative change of the lumbar spine superimposed on borderline congenital canal narrowing. 2. No fracture, malalignment or acute osseous process. 3. Mild canal stenosis L3-4. Neural foraminal narrowing  L2-3 through L4-5: Mild to moderate at L3-4 and L4-5. IMPRESSION:2018 Osteoarthritic change at several levels. Rudimentary cervical  ribs bilaterally. No fracture or spondylolisthesis   PATIENT SURVEYS:  FOTO  68%  predicted 68%     SCREENING FOR RED FLAGS: Bowel or bladder incontinence: No     COGNITION: Overall cognitive status: Within functional limits for tasks assessed                          SENSATION: Tingling/numbness down to ankle LT   MUSCLE LENGTH: Hamstrings: Right 70  deg; Left 67 deg     POSTURE: rounded shoulders, forward head, and flexed trunk    PALPATION: TTP  CERVICAL ROM:    Active ROM A/PROM (deg) eval AROM   Flexion 50 53  Extension 54 p! 55  Right lateral flexion 17p! On LT 46  Left lateral flexion 20 24  Right rotation 40 60  Left rotation 40 1/10 pain 45   (Blank rows = not tested)  LUMBAR ROM:    AROM eval  Flexion Mid shin fingertips p! On Lt side  Extension 50% p! In LT side  Right lateral flexion Finger tip to 1 inch below knee jt line p! On LT  Left lateral flexion Finger ipt to 1 inch  above knee jt   Right rotation 90%  Left rotation 90%   (Blank rows = not tested)   LOWER EXTREMITY ROM:   all WNL except specified   Active  Right eval Left eval  Hip flexion      Hip extension      Hip abduction      Hip adduction      Hip internal rotation 25 20  Hip external rotation 45 30  Knee flexion      Knee extension      Ankle dorsiflexion      Ankle plantarflexion      Ankle inversion      Ankle eversion       (Blank rows = not tested)   LOWER EXTREMITY MMT:    core  4-/5   MMT Right eval Left eval  Hip flexion 4+ 4+  Hip extension 4 4  Hip abduction 4 4  Hip adduction      Hip internal rotation      Hip external rotation      Knee flexion 5 5  Knee extension 5 5  Ankle dorsiflexion      Ankle plantarflexion      Ankle inversion      Ankle eversion       (Blank rows = not tested)       FUNCTIONAL TESTS:  5 times sit to stand: 10.02 sec 6 minute walk test: TBD                       Sustained bridge  GAIT: Distance walked: 150  ft Assistive device utilized: None Level of assistance: Complete Independence Comments: Normal gait   Pt walks daily 30 min and goes to gym 1 x a week for weight training.   TODAY'S TREATMENT:       [facility] Adult PT Treatment:                                                DATE:  02/06/23 Therapeutic Exercise: UBE 5 min in reverse, level 3.5  Bird dog x 5 each , 5-10 sec  Sideplank 30 sec x 2 each side  Front plank 30 sec x 2  Open book x 5  Clam green band x 30  Bridge x 10  Bridge x march x 5 slow green TB  Self Care: FOTO score, DC, massage therapy, gym recommendations, frequency     [facility] Adult PT Treatment:                                                DATE: 01/23/23 Therapeutic Exercise: UBE for 5 min , 2.5 min each direction, corrected posture  Cervical Retraction at Wall 10 sec x 10  Standing Shoulder Horizontal Abduction with Resistance green band  x 10 head on wall, then another set off wall  row green band narrow and wide in doorway x 15 each , chin tuck Extension bilateral green band  Rhomboid stretch , tennis ball for self massage to help medial scapular border  Supine cervical rotation, flex/ext with ball under head  Supine Deep Neck Flexor Training   Supine DNF Liftoffs   Supine green band ER/IR with head press, narrow grip overhead  x 10  Bird Dog  UE and LE cues for head position  Upper trunk rotation x  5 each side  Green band rotation/row Manual Therapy:  Tr P to L rhomboid and L cervicals, upper trap and levator   Self Care: Tennis ball for soft tissue release   Posture and chin position, neck position   [facility] Adult PT Treatment:                                                DATE: 01/17/23 Therapeutic Exercise: Footwork  Double leg Parallel heels, toes narrow and wide Pilates V heels and toes narrow and wide  2 red 1 blue 1 yellow  Supine arms 1 red 1 Yellow Arcs cervical retraction in supine, at wall and in quadruped Prone scap retraction x 10 with  shoulder extension Supine horizontal abduction green band x 15 with chin tuck Bird dog x 10                                                                                                               DATE: 01-10-23  EVAL      PATIENT EDUCATION:  Education details: Eval  POC  verbally educated about posture Person educated: Patient Education method: Programmer, multimedia, Facilities manager, Actor cues, and Verbal cues Education comprehension: verbalized understanding, returned demonstration, verbal cues required, tactile cues required, and needs further education   HOME EXERCISE PROGRAM: Access Code: [email or web address] URL: [email or web address] Date: 02/06/2023 Prepared by: Karie Mainland  Exercises - Cervical Retraction at [facility]  -  1 x daily - 7 x weekly - 2 sets - 10 reps - 10 hold - Supine Deep Neck Flexor Training  - 1 x daily - 7 x weekly - 2 sets - 10 reps - 10 hold - Supine DNF Liftoffs  - 1 x daily - 7 x weekly - 1 sets - 5-10 reps - 10-15 hold - Standing Shoulder Horizontal Abduction with Resistance  - 1 x daily - 7 x weekly - 2 sets - 10 reps - 5-10 hold - Bird Dog  - 1 x daily - 7 x weekly - 2 sets - 10 reps - 5-10 hold - Sidelying Thoracic Rotation with Open Book  - 1 x daily - 7 x weekly - 1 sets - 10 reps - 10 hold - Kneeling Pushup Plus on Elbows  - 1 x daily - 7 x weekly - 1 sets - 3-5 reps - 30 hold - Side Plank on Knees  - 1 x daily - 7 x weekly - 1 sets - 3-5 reps - 30 hold - Clamshell with Resistance  - 1 x daily - 7 x weekly - 1-2 sets - 15-20 reps  ASSESSMENT:   CLINICAL IMPRESSION:   Patient presents with very min pain in neck and low back.  He has not gone to the gym in > 2 weeks as he has been busy with a home project.  He has a good home program and was given a goal of returning to the gym 2 x per week to do his normal strengthening routine.  I do not see him participating in Pilates as he did not show an interest in it when offered. He has improved his cervical  ROM.  He was also given resources for therapeutic massage.  He was agreeable to discharge from PT.  FOTO score stayed the same which was the goal.     OBJECTIVE IMPAIRMENTS: decreased ROM, decreased strength, improper body mechanics, postural dysfunction, and pain.    ACTIVITY LIMITATIONS: carrying, lifting, bending, sitting, standing, and squatting   PARTICIPATION LIMITATIONS: driving and yard work   PERSONAL FACTORS: Moby Cervical disc implant for C5/29 November 2017, , Taylors bunion, CKD, Atherosclerosis , BPH, Cervical radiculopathy, diverticulosis, Insomnia,CVA, 10/2014, TIA See medical chart are also affecting patient's functional outcome.    REHAB POTENTIAL: Good   CLINICAL DECISION MAKING: Evolving/moderate complexity   EVALUATION COMPLEXITY: Moderate     GOALS: Goals reviewed with patient? Yes   SHORT TERM GOALS: Target date: 02-09-23   Pt will be independent with initial HEP Baseline:no knowledge Goal status:MET   2.  Report pain  from 4/10 to 2/10 with functional moblity Baseline: 4/10 pain Goal status: MET   3.  Demonstrate understanding of proper sitting posture and be more conscious of position and posture throughout the day.  Baseline: Limited knowledge Goal status:MET    4.  Pt will increase AROM by at least 5 degrees with cervical rotation Baseline:  R and L 40 degrees Goal status MET        LONG TERM GOALS: Target date: 02-26-23   Pt will be independent with advanced HEP Baseline: limited knowledge Goal status: INITIAL   2. Pain will decrease to 1/10 with all functional activities and be able to utilize exercise/movement to control sharpened pain for neck and back Baseline: 4./10 pain on L neck , low back.  1/10  Goal status: met   3.  Pt will be able to perform yard work duties without exacerbating pain greater  than 1/10  Baseline: Pt unable to bend down and pick up sticks without pain in low back Goal status: partially met , limited to 30 min or less     4.  Pt will be educated on Continuing community based exercise program with progressive strengthening  Baseline: a member at [facility] 1 x a week Goal status: not met, has not gone    5.  Pt will be able to utilized neck pain modulating techniques to decrease pinching pain in left neck Baseline: no knowledge.  . Does HEP, rest, stretch  Goal status: partially met    6.  Pt will improve bil LE strength to >/= 4+/5 with </= 1/10 pain to promote safety with walking/standing activities Baseline: Pt expressed on time in last 2 months losing balance and catching himself to prevent falls. MMT as in flow chart Goal status: NT    PLAN:   PT FREQUENCY: 1-2x/week   PT DURATION: 6 weeks   PLANNED INTERVENTIONS: Therapeutic exercises, Therapeutic activity, Neuromuscular re-education, Balance training, Gait training, Patient/Family education, Self Care, Joint mobilization, Stair training, Electrical stimulation, Cryotherapy, Moist heat, Taping, Manual therapy, and Re-evaluation.   PLAN FOR NEXT SESSION: NA   Karie Mainland, PT 02/06/23 11:01 AM Phone: [phone] Fax: [phone]     PHYSICAL THERAPY DISCHARGE SUMMARY  Visits from Start of Care: 4  Current functional level related to goals / functional outcomes: See above   Remaining deficits: Cervical ROM, min pain in neck and low back    Education / Equipment: HEP, core, posture, pilates    Patient agrees to discharge. Patient goals were partially met. Patient is being discharged due to maximized rehab potential.

## 2023-02-06 ENCOUNTER — Ambulatory Visit: Payer: Medicare Other | Attending: Internal Medicine | Admitting: Physical Therapy

## 2023-02-06 ENCOUNTER — Ambulatory Visit: Payer: Medicare Other | Admitting: Physical Therapy

## 2023-02-06 ENCOUNTER — Encounter: Payer: Self-pay | Admitting: Physical Therapy

## 2023-02-06 DIAGNOSIS — R293 Abnormal posture: Secondary | ICD-10-CM

## 2023-02-06 DIAGNOSIS — M5410 Radiculopathy, site unspecified: Secondary | ICD-10-CM

## 2023-02-06 DIAGNOSIS — M6281 Muscle weakness (generalized): Secondary | ICD-10-CM | POA: Diagnosis present

## 2023-02-06 DIAGNOSIS — R279 Unspecified lack of coordination: Secondary | ICD-10-CM

## 2023-02-06 DIAGNOSIS — M542 Cervicalgia: Secondary | ICD-10-CM | POA: Diagnosis present

## 2023-02-07 ENCOUNTER — Ambulatory Visit (HOSPITAL_COMMUNITY)
Admission: RE | Admit: 2023-02-07 | Discharge: 2023-02-07 | Disposition: A | Discharge status: Home / Self Care | Payer: Medicare Other | Source: Ambulatory Visit | Attending: Cardiology | Admitting: Cardiology

## 2023-02-07 DIAGNOSIS — I739 Peripheral vascular disease, unspecified: Secondary | ICD-10-CM

## 2023-02-13 ENCOUNTER — Telehealth: Payer: Self-pay

## 2023-02-13 ENCOUNTER — Other Ambulatory Visit: Payer: Self-pay

## 2023-02-13 ENCOUNTER — Ambulatory Visit: Payer: Medicare Other | Admitting: Physical Therapy

## 2023-02-13 DIAGNOSIS — I739 Peripheral vascular disease, unspecified: Secondary | ICD-10-CM

## 2023-02-13 NOTE — Telephone Encounter (Signed)
Attempted phone call to pt and left voicemail message to call [phone]. 

## 2023-02-13 NOTE — Telephone Encounter (Signed)
-----   Message from Quintella Reichert, MD sent at 02/12/2023  7:39 AM EDT ----- Abnormal ABIs that is consistent with peripheral arterial disease - refer to Dr. Maryruth Hancock ----- Message ----- From: Iran Ouch, MD Sent: 02/10/2023   3:45 PM EDT To: Quintella Reichert, MD  Hi Traci,  His vessels are calcified which establishes diagnosis of peripheral arterial disease.  His toe pressure is normal on the left side with relatively normal waveforms.  Thus, I doubt obstructive disease on the left.  His toe pressure is abnormal on the right side and there might be significant disease there.  If his symptoms are suggestive of claudication, it might be worth seeing him for consultation. Thanks.

## 2023-02-14 NOTE — Telephone Encounter (Signed)
Patient returned call

## 2023-02-14 NOTE — Telephone Encounter (Signed)
Spoke with pt and advised of testing results and recommendation per Dr Mayford Knife as below.  Pt verbalizes understanding and is agreeable to scheduling with Dr Kirke Corin. Referral to Dr Kirke Corin placed and sent to [facility] for assistance with scheduling appointment.    ----- Message from Quintella Reichert, MD sent at 02/12/2023  7:39 AM EDT ----- Abnormal ABIs that is consistent with peripheral arterial disease - refer to Dr. Maryruth Hancock

## 2023-02-14 NOTE — Addendum Note (Signed)
Addended by: Alois Cliche on: 02/14/2023 09:46 AM   Modules accepted: Orders

## 2023-02-22 ENCOUNTER — Ambulatory Visit: Payer: Medicare Other | Admitting: Physical Therapy

## 2023-03-02 ENCOUNTER — Encounter: Payer: Self-pay | Admitting: Cardiology

## 2023-03-02 ENCOUNTER — Telehealth: Payer: Self-pay

## 2023-03-02 DIAGNOSIS — R42 Dizziness and giddiness: Secondary | ICD-10-CM

## 2023-03-02 NOTE — Telephone Encounter (Signed)
Patient writing in via Mychart regarding concerns about dizzy spells. He reports that for the last 3 weeks he has had dizzy spells with blurry vision every 1-2 days. He states they usually last for 15-30 mins. They occur with normal activity and sometimes at rest, but notably they did not occurr when he was working out at the gym on [address] a week ago. He states he has not checked his HR or BP when these episodes occur. His HR was 53 and BP was 112/62 when he checked while on the phone with me. Forwarded to Dr. Mayford Knife.

## 2023-03-02 NOTE — Telephone Encounter (Signed)
Per Dr. Mayford Knife, called patient back to explain that it is recommended he get a 2 week ziopatch for dizziness as well as carotid dopplers. Patient states he is willing to proceed with carotid dopplers, but he has tried the home heart monitors before and had "severe" reactions to several types of adhesive used for device. He declines home heart monitor at this time, but verbalizes understanding that he needs to see PCP to make sure this is not some type of atypical migraine or TIA. Forwarded to PCP Dr. Oliver Barre per patient request.

## 2023-03-02 NOTE — Telephone Encounter (Signed)
Patient writing in on Mychart that he has episodes of feeling dizzy w/ blurred vision for several weeks. Called patient directly to discuss symptoms, no answer. Left detailed msg w/ Callback # per DPR on VM.

## 2023-03-15 ENCOUNTER — Ambulatory Visit (HOSPITAL_COMMUNITY)
Admission: RE | Admit: 2023-03-15 | Discharge: 2023-03-15 | Disposition: A | Discharge status: Home / Self Care | Payer: Medicare Other | Source: Ambulatory Visit | Attending: Cardiology | Admitting: Cardiology

## 2023-03-15 DIAGNOSIS — R42 Dizziness and giddiness: Secondary | ICD-10-CM | POA: Insufficient documentation

## 2023-03-16 ENCOUNTER — Encounter: Payer: Self-pay | Admitting: Internal Medicine

## 2023-03-16 ENCOUNTER — Telehealth: Payer: Self-pay

## 2023-03-16 ENCOUNTER — Encounter: Payer: Self-pay | Admitting: Cardiology

## 2023-03-16 DIAGNOSIS — I779 Disorder of arteries and arterioles, unspecified: Secondary | ICD-10-CM

## 2023-03-16 DIAGNOSIS — E042 Nontoxic multinodular goiter: Secondary | ICD-10-CM

## 2023-03-16 NOTE — Telephone Encounter (Signed)
Called and left detailed message per DPR advising that patient can hold off on anti-platelet therapy for now. Chart updated to reflect intolerance to aspirin.

## 2023-03-16 NOTE — Telephone Encounter (Signed)
Dr Mayford Knife has already responded, thanks

## 2023-03-16 NOTE — Telephone Encounter (Signed)
Spoke with patient regarding carotid scan results as well as ABI results from May.  Patient states he has an appt w/ Dr. Kirke Corin 04/04/23 to follow up on possible blood flow issues in right foot seen on ABI in May 2024.  He agrees to referral to Dr. Chestine Spore for carotid artery plaque as well as referral to Dr. Talmage Nap for thyroid nodules. He states thyroid nodules are chronic and he used to follow with an endocrinologist but has not seen anyone in 4-5 years.   He states he cannot take aspirin as it irritates his stomach, he has been prescribed this medication several times and each time he experienced stomach irritation even when he took it with food. Forwarded to Dr. Mayford Knife for advice.

## 2023-03-16 NOTE — Telephone Encounter (Signed)
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 Clotilde Dieter with [facility] to follow. Add ASA 81mg  daily.  Abnormal thyroid with several nodules that need further evaluation - please refer to Dr. Sabino Gasser

## 2023-03-20 ENCOUNTER — Telehealth (HOSPITAL_COMMUNITY): Payer: Self-pay | Admitting: Cardiology

## 2023-03-20 NOTE — Telephone Encounter (Signed)
Spoke to patient, who agrees to keep his Stress/PET/CT as scheduled for 04/11/23 once I reviewed the reason for the test based on Dr. Norris Cross recommendations from office visit on 01/16/23.

## 2023-03-20 NOTE — Telephone Encounter (Signed)
I called patient to schedule Cardiac PET CT and he did not schedule due to reason below:   03/20/23 Patient declined to schedule due to expense and will call and talk to Dr Mayford Knife. He states he is not having any chest pain. LBW  03/17/23 [facility] to schedule @ 12:41/LBW  Order will be removed from the PET CT WQ.

## 2023-03-28 ENCOUNTER — Encounter: Payer: Medicare Other | Admitting: *Deleted

## 2023-03-28 DIAGNOSIS — Z006 Encounter for examination for normal comparison and control in clinical research program: Secondary | ICD-10-CM

## 2023-03-28 MED ORDER — STUDY - ORION 4 - INCLISIRAN 300 MG/1.5 ML OR PLACEBO SQ INJECTION (PI-STUCKEY)
300.0000 mg | INJECTION | SUBCUTANEOUS | Status: DC
  Administered 2023-03-28: 300 mg via SUBCUTANEOUS
  Filled 2023-03-28: qty 1.5

## 2023-03-28 NOTE — Research (Signed)
Orion 4   Patient doing well, no chest pains or shortness of breath.  No AE or SAE noted during visit.  Meds reviewed  Injection given: 03/28/2023 @ 0934  Next visit: Oct 02, 2022  @ 0830 no fasting labs    Current Outpatient Medications:    Barberry-Oreg Grape-Goldenseal (BERBERINE COMPLEX) 200-200-50 MG CAPS, Take 1 capsule by mouth daily as needed., Disp: , Rfl:    bisacodyl (DULCOLAX) 5 MG EC tablet, Take 5 mg by mouth daily as needed., Disp: , Rfl:    cetirizine (ZYRTEC) 10 MG tablet, Take 10 mg by mouth daily as needed for allergies., Disp: , Rfl:    co-enzyme Q-10 30 MG capsule, Take 100 mg by mouth as needed., Disp: , Rfl:    diltiazem 2 % GEL, APPLY A PEA SIZED AMOUNT PER RECTUM THREE TIMES A DAY FOR 8 WEEKS., Disp: , Rfl:    docusate sodium (COLACE) 100 MG capsule, Take 100 mg by mouth daily as needed for mild constipation., Disp: , Rfl:    fluticasone (FLONASE) 50 MCG/ACT nasal spray, USE 2 SPRAYS IN EACH  NOSTRIL DAILY, Disp: 48 g, Rfl: 1   Magnesium 250 MG TABS, Take 1 tablet by mouth daily., Disp: , Rfl:    Menaquinone-7 (VITAMIN K2) 100 MCG CAPS, Take 1 capsule by mouth as needed., Disp: , Rfl:    metroNIDAZOLE (METROCREAM) 0.75 % cream, Apply topically 2 (two) times daily., Disp: 45 g, Rfl: 2   Naphazoline-Pheniramine (OPCON-A OP), Apply 1 drop to eye as needed., Disp: , Rfl:    rosuvastatin (CRESTOR) 10 MG tablet, TAKE 1 TABLET(10 MG) BY MOUTH DAILY, Disp: 90 tablet, Rfl: 3   Vitamin D, Cholecalciferol, 25 MCG (1000 UT) TABS, Take 1 capsule by mouth daily., Disp: , Rfl:    B Complex-C (SUPER B COMPLEX/VITAMIN C) TABS, Take 1 tablet by mouth daily. (Patient not taking: Reported on 03/28/2023), Disp: , Rfl:    diltiazem 2 % GEL, Apply 1 Application topically 2 (two) times daily. Use pea sized amount per rectum three times a day for 8 weeks, Disp: 30 g, Rfl: 1   Study - ORION 4 - inclisiran 300 mg/1.54mL or placebo SQ injection (PI-Stuckey), Inject 1.5 mLs (300 mg total) into  the skin every 6 (six) months., Disp: 1 mL, Rfl: 0   tadalafil (CIALIS) 5 MG tablet, Take 1 tablet (5 mg total) by mouth daily. (Patient not taking: Reported on 03/28/2023), Disp: 90 tablet, Rfl: 3   Turmeric 500 MG CAPS, Take 1 capsule by mouth daily. (Patient not taking: Reported on 03/28/2023), Disp: , Rfl:   Current Facility-Administered Medications:    Study - ORION 4 - inclisiran 300 mg/1.20mL or placebo SQ injection (PI-Stuckey), 300 mg, Subcutaneous, Q6 months, Riley Kill, Arturo Morton, MD

## 2023-04-04 ENCOUNTER — Other Ambulatory Visit: Payer: Self-pay | Admitting: Nurse Practitioner

## 2023-04-04 ENCOUNTER — Ambulatory Visit: Payer: Medicare Other | Admitting: Cardiovascular Disease

## 2023-04-04 DIAGNOSIS — E042 Nontoxic multinodular goiter: Secondary | ICD-10-CM

## 2023-04-10 ENCOUNTER — Telehealth (HOSPITAL_COMMUNITY): Payer: Self-pay | Admitting: *Deleted

## 2023-04-10 NOTE — Telephone Encounter (Signed)
 Patient returning call about his upcoming cardiac imaging study; pt verbalizes understanding of appt date/time, parking situation and where to check in, pre-test NPO status and verified current allergies; name and call back number provided for further questions should they arise  Jerry Clement RN Navigator Cardiac Imaging [facility] [phone] office [phone] cell  Patient aware to avoid caffeine 12 hours prior to his cardiac PET scan.

## 2023-04-10 NOTE — Telephone Encounter (Signed)
Attempted to call patient regarding upcoming cardiac PET appointment. Left message on voicemail with name and callback number  Merle Prescott RN Navigator Cardiac Imaging [facility] [phone] Office [phone] Cell  

## 2023-04-11 ENCOUNTER — Encounter: Payer: Self-pay | Admitting: Cardiology

## 2023-04-11 ENCOUNTER — Encounter (HOSPITAL_COMMUNITY)
Admission: RE | Admit: 2023-04-11 | Discharge: 2023-04-11 | Disposition: A | Discharge status: Home / Self Care | Payer: Medicare Other | Source: Ambulatory Visit | Attending: Cardiology | Admitting: Cardiology

## 2023-04-11 DIAGNOSIS — R079 Chest pain, unspecified: Secondary | ICD-10-CM | POA: Diagnosis not present

## 2023-04-11 DIAGNOSIS — R931 Abnormal findings on diagnostic imaging of heart and coronary circulation: Secondary | ICD-10-CM | POA: Insufficient documentation

## 2023-04-11 LAB — NM PET CT CARDIAC PERFUSION MULTI W/ABSOLUTE BLOODFLOW
LV dias vol: 79 mL (ref 62–150)
LV sys vol: 40 mL
MBFR: 4.3
Nuc Rest EF: 49 %
Nuc Stress EF: 64 %
Rest MBF: 0.54 ml/g/min
Rest Nuclear Isotope Dose: 15.4 mCi
ST Depression (mm): 0 mm
Stress MBF: 2.32 ml/g/min
Stress Nuclear Isotope Dose: 15.6 mCi

## 2023-04-11 MED ORDER — RUBIDIUM RB82 GENERATOR (RUBYFILL)
15.4300 | PACK | Freq: Once | INTRAVENOUS | Status: AC
  Administered 2023-04-11: 15.43 via INTRAVENOUS

## 2023-04-11 MED ORDER — REGADENOSON 0.4 MG/5ML IV SOLN
0.4000 mg | Freq: Once | INTRAVENOUS | Status: AC
  Administered 2023-04-11: 0.4 mg via INTRAVENOUS

## 2023-04-11 MED ORDER — REGADENOSON 0.4 MG/5ML IV SOLN
INTRAVENOUS | Status: AC
  Filled 2023-04-11: qty 5

## 2023-04-11 MED ORDER — RUBIDIUM RB82 GENERATOR (RUBYFILL)
15.5600 | PACK | Freq: Once | INTRAVENOUS | Status: AC
  Administered 2023-04-11: 15.56 via INTRAVENOUS

## 2023-04-11 NOTE — Progress Notes (Signed)
Pt tolerated test well

## 2023-04-12 ENCOUNTER — Encounter: Payer: Self-pay | Admitting: Vascular Surgery

## 2023-04-12 ENCOUNTER — Telehealth: Payer: Self-pay

## 2023-04-12 ENCOUNTER — Ambulatory Visit (INDEPENDENT_AMBULATORY_CARE_PROVIDER_SITE_OTHER): Payer: Medicare Other | Admitting: Vascular Surgery

## 2023-04-12 VITALS — BP 100/61 | HR 44 | Temp 98.1°F | Resp 20 | Ht 67.0 in | Wt 130.0 lb

## 2023-04-12 DIAGNOSIS — I6523 Occlusion and stenosis of bilateral carotid arteries: Secondary | ICD-10-CM | POA: Diagnosis not present

## 2023-04-12 NOTE — Progress Notes (Signed)
Patient ID: Jerry Rios, male   DOB: April 15, 1951, 72 y.o.   MRN: [id number]  Reason for Consult: New Patient (Initial Visit)   Referred by Corwin Levins, MD  Subjective:     HPI:  Jerry Rios is a 72 y.o. male without significant history of vascular disease and specifically denies any history of stroke, TIA or amaurosis.  He remains active working out at [employer] multiple times per week.  He does have some issues with weakness in the right quadriceps muscles but denies any claudication.  He is sent for evaluation carotid arteries with notable disease on recent duplex.  He was having issues with blurry vision mostly in the right eye this was occurring in the morning.  He has impaired vision in the left eye unknown if he was blurry or not.  This mostly resolved has had a couple issues lately but states that he also has issues with hypotension which are associated at the time of visual disturbances.  Past Medical History:  Diagnosis Date   Abdominal pain, unspecified site 05/30/2014   Agatston coronary artery calcium score greater than 400    Allergic rhinitis 04/08/2007   Qualifier: Diagnosis of  By: Jonny Ruiz MD, Len Blalock    Allergy    Arthritis    lumbar spine   Atherosclerosis    BACK PAIN, LUMBAR 07/10/2008   Qualifier: Diagnosis of  By: Jonny Ruiz MD, Len Blalock    BENIGN PROSTATIC HYPERTROPHY 08/05/2007   Qualifier: Diagnosis of  By: Jonny Ruiz MD, Len Blalock    BPH (benign prostatic hypertrophy)    Cataract    starting- mild    Cervical radiculopathy 08/01/2017   CKD (chronic kidney disease), stage III (HCC)    COLONIC POLYPS, HX OF 08/05/2007   Adenoma polpy per last coonscopy Sep 01, 2006, Dr Marina Goodell    Cough 11/12/2009   Qualifier: Diagnosis of  By: Yetta Barre MD, Bernadene Bell.    Depression 04/08/2007   Qualifier: Diagnosis of  By: Jonny Ruiz MD, Len Blalock    Diverticulosis of colon    DIVERTICULOSIS, COLON 08/05/2007   Qualifier: Diagnosis of  By: Jonny Ruiz MD, Len Blalock    ECZEMA, ATOPIC 11/12/2009    Qualifier: Diagnosis of  By: Yetta Barre MD, Bernadene Bell.    HEMORRHOIDS 07/10/2008   Qualifier: Diagnosis of  By: Jonny Ruiz MD, Len Blalock    History of nephrolithiasis    History of shingles    Hx of colonic polyp    Hyperglycemia 11/19/2014   Hyperlipidemia    Hypertension    some past history    Insomnia    Insomnia, unspecified 04/08/2007   Centricity Description: SYMPTOM, INSOMNIA NOS Qualifier: Diagnosis of  By: Jonny Ruiz MD, Len Blalock  Centricity Description: INSOMNIA-SLEEP DISORDER-UNSPEC Qualifier: Diagnosis of  By: Jonny Ruiz MD, Len Blalock    KNEE PAIN, LEFT 07/10/2008   Qualifier: Diagnosis of  By: Jonny Ruiz MD, Len Blalock    Libido, decreased 10/04/2011   Loss of weight 05/30/2014   Multinodular goiter 12/14/2016   Nausea in adult patient 05/30/2014   NEPHROLITHIASIS, HX OF 08/05/2007   Qualifier: Diagnosis of  By: Jonny Ruiz MD, Len Blalock    RENAL INSUFFICIENCY 09/07/2010   Qualifier: Diagnosis of  By: Jonny Ruiz MD, Len Blalock    Right leg pain 10/04/2018   Stroke (HCC) 10/2014   TIA (transient ischemic attack)    Vitiligo 10/31/2012   Family History  Problem Relation Age of Onset   Ulcerative colitis Sister    Colitis  Sister    Heart disease Other        Grandparents   Hyperlipidemia Other    Hypertension Other    Heart failure Mother    CAD Mother        died of CAD at age 90   Heart failure Father    CAD Father        died from cardiac arrest at 56   Lupus Paternal Grandfather    Heart failure Maternal Grandfather    Colon cancer Neg Hx    Esophageal cancer Neg Hx    Pancreatic cancer Neg Hx    Stomach cancer Neg Hx    Liver disease Neg Hx    Colon polyps Neg Hx    Rectal cancer Neg Hx    Diabetes Neg Hx    Past Surgical History:  Procedure Laterality Date   CERVICAL DISC ARTHROPLASTY  12/08/2017   COLONOSCOPY     CYSTOSCOPY WITH URETEROSCOPY AND STENT PLACEMENT Left 07/04/2014   Procedure: CYSTOSCOPY WITH LEFT RETROGRADE PYLEOGRAM, LEFT URETERAL DILATION AND URETEROSCOPY, LASER LITHOTRISPY AND  STENT PLACEMENT;  Surgeon: Crist Fat, MD;  Location: [facility];  Service: Urology;  Laterality: Left;   HOLMIUM LASER APPLICATION N/A 07/04/2014   Procedure:  LASER LITHOTRIPSY;  Surgeon: Crist Fat, MD;  Location: [facility];  Service: Urology;  Laterality: N/A;   lithotrypsy     x 3   POLYPECTOMY     TONSILLECTOMY      Short Social History:  Social History   Tobacco Use   Smoking status: Former    Current packs/day: 0.50    Average packs/day: 0.5 packs/day for 10.0 years (5.0 ttl pk-yrs)    Types: Cigarettes   Smokeless tobacco: Never   Tobacco comments:    Regular exercise - No  Substance Use Topics   Alcohol use: Not Currently    Alcohol/week: 7.0 standard drinks of alcohol    Types: 7 Cans of beer per week    Allergies  Allergen Reactions   Aspirin Other (See Comments)    Causes Stomach Upset   Clobetasol     Other reaction(s): Unknown   Adhesive [Tape] Rash   Crestor [Rosuvastatin]     REACTION: myalgia   Lipitor [Atorvastatin]     REACTION: myalgia   Nexlizet [Bempedoic Acid-Ezetimibe] Itching    Myalgia in fingers, hands, feet   Praluent [Alirocumab]     ineffective   Repatha [Evolocumab]     ineffective   Zocor [Simvastatin]     REACTION: myalgia    Current Outpatient Medications  Medication Sig Dispense Refill   Barberry-Oreg Grape-Goldenseal (BERBERINE COMPLEX) 200-200-50 MG CAPS Take 1 capsule by mouth daily as needed.     bisacodyl (DULCOLAX) 5 MG EC tablet Take 5 mg by mouth daily as needed.     cetirizine (ZYRTEC) 10 MG tablet Take 10 mg by mouth daily as needed for allergies.     co-enzyme Q-10 30 MG capsule Take 100 mg by mouth as needed.     diltiazem 2 % GEL Apply 1 Application topically 2 (two) times daily. Use pea sized amount per rectum three times a day for 8 weeks 30 g 1   diltiazem 2 % GEL APPLY A PEA SIZED AMOUNT PER RECTUM THREE TIMES A DAY FOR 8 WEEKS.     docusate sodium (COLACE) 100 MG capsule Take 100 mg by mouth daily as  needed for mild constipation.     fluticasone (FLONASE) 50 MCG/ACT nasal  spray USE 2 SPRAYS IN EACH  NOSTRIL DAILY 48 g 1   Magnesium 250 MG TABS Take 1 tablet by mouth daily.     Menaquinone-7 (VITAMIN K2) 100 MCG CAPS Take 1 capsule by mouth as needed.     metroNIDAZOLE (METROCREAM) 0.75 % cream Apply topically 2 (two) times daily. 45 g 2   Naphazoline-Pheniramine (OPCON-A OP) Apply 1 drop to eye as needed.     rosuvastatin (CRESTOR) 10 MG tablet TAKE 1 TABLET(10 MG) BY MOUTH DAILY 90 tablet 3   Study - ORION 4 - inclisiran 300 mg/1.73mL or placebo SQ injection (PI-Stuckey) Inject 1.5 mLs (300 mg total) into the skin every 6 (six) months. 1 mL 0   tadalafil (CIALIS) 5 MG tablet Take 1 tablet (5 mg total) by mouth daily. 90 tablet 3   Vitamin D, Cholecalciferol, 25 MCG (1000 UT) TABS Take 1 capsule by mouth daily.     Current Facility-Administered Medications  Medication Dose Route Frequency Provider Last Rate Last Admin   Study - ORION 4 - inclisiran 300 mg/1.45mL or placebo SQ injection (PI-Stuckey)  300 mg Subcutaneous Q6 months Herby Abraham, MD   300 mg at 03/28/23 5284    Review of Systems  Constitutional:  Constitutional negative. HENT: HENT negative.  Eyes: Positive for visual disturbance.   Respiratory: Respiratory negative.  Cardiovascular: Cardiovascular negative.  GI: Gastrointestinal negative.  Musculoskeletal: Positive for leg pain.  Skin: Skin negative.  Neurological: Neurological negative. Hematologic: Hematologic/lymphatic negative.  Psychiatric: Psychiatric negative.        Objective:  Objective   Vitals:   04/12/23 0852 04/12/23 0854  BP: 111/68 100/61  Pulse: (!) 44   Resp: 20   Temp: 98.1 F (36.7 C)   SpO2: 98%   Weight: 130 lb (59 kg)   Height: 5\' 7"  (1.702 m)    Body mass index is 20.36 kg/m.  Physical Exam HENT:     Head: Normocephalic.     Nose: Nose normal.  Eyes:     Pupils: Pupils are equal, round, and reactive to light.  Neck:      Vascular: No carotid bruit.  Cardiovascular:     Rate and Rhythm: Normal rate.     Pulses: Normal pulses.  Pulmonary:     Effort: Pulmonary effort is normal.  Abdominal:     General: Abdomen is flat.  Musculoskeletal:        General: Normal range of motion.     Right lower leg: No edema.     Left lower leg: No edema.  Skin:    General: Skin is warm.     Capillary Refill: Capillary refill takes less than 2 seconds.  Neurological:     General: No focal deficit present.     Mental Status: He is alert.  Psychiatric:        Mood and Affect: Mood normal.        Thought Content: Thought content normal.        Judgment: Judgment normal.     Data: Right Carotid Findings:  +----------+--------+--------+--------+----------------------+----------+           PSV cm/sEDV cm/sStenosisPlaque Description    Comments    +----------+--------+--------+--------+----------------------+----------+  CCA Prox  114     14                                                +----------+--------+--------+--------+----------------------+----------+  CCA Distal66      15                                                +----------+--------+--------+--------+----------------------+----------+  ICA Prox  42      13      Normal  heterogenous and focal            +----------+--------+--------+--------+----------------------+----------+  ICA Distal78      24                                    steep dive  +----------+--------+--------+--------+----------------------+----------+  ECA      74      5                                                 +----------+--------+--------+--------+----------------------+----------+   +----------+--------+-------+----------------+-------------------+           PSV cm/sEDV cmsDescribe        Arm Pressure (mmHG)  +----------+--------+-------+----------------+-------------------+  [id number]           Multiphasic, WNL                      +----------+--------+-------+----------------+-------------------+   +---------+--------+--+--------+--+---------+  VertebralPSV cm/s37EDV cm/s10Antegrade  +---------+--------+--+--------+--+---------+      Left Carotid Findings:  +----------+--------+--------+--------+------------------+--------+           PSV cm/sEDV cm/sStenosisPlaque DescriptionComments  +----------+--------+--------+--------+------------------+--------+  CCA Prox  81      9                                           +----------+--------+--------+--------+------------------+--------+  CCA Distal67      17                                          +----------+--------+--------+--------+------------------+--------+  ICA Prox  68      15      1-39%   heterogenous                +----------+--------+--------+--------+------------------+--------+  ICA Distal67      21                                          +----------+--------+--------+--------+------------------+--------+  ECA      69      5                                           +----------+--------+--------+--------+------------------+--------+   +----------+--------+--------+----------------+-------------------+           PSV cm/sEDV cm/sDescribe        Arm Pressure (mmHG)  +----------+--------+--------+----------------+-------------------+  [id number]            Multiphasic, WNL                     +----------+--------+--------+----------------+-------------------+   +---------+--------+--+--------+-+---------+  VertebralPSV cm/s42EDV cm/s9Antegrade  +---------+--------+--+--------+-+---------+         Summary:  Right Carotid: There is no evidence of stenosis in the right ICA. The                 extracranial vessels were near-normal with only minimal  wall                thickening or plaque.   Left Carotid: Velocities in the left ICA are consistent with a 1-39%   stenosis.               Hemodynamically significant plaque >50% visualized in the  CCA.   Vertebrals: Bilateral vertebral arteries demonstrate antegrade flow.  Subclavians: Normal flow hemodynamics were seen in bilateral subclavian               arteries.                 Inhomogeneous nodule in the mid/inferior pole of the right  thyroid              with increase vascularity, measuring 2.5 x 1.3 x 2.4 cm and a  solid              nodule with increase vascularity in the superior pole,  measuring              1.2 x .8 x 1.3 cm. Inhomogeneous nodule with shadowing from               calcifications and with increase vascularity in the left               mid/inferior pole of the left thyroid, measuring 2.6 x 2.3 x  4.0              cm.      Assessment/Plan:    72 year old male with asymptomatic carotid stenosis.  Some visual deficits which do not seem to be related to carotid artery stenosis which is minimal.  Plan will be to follow-up with repeat carotid duplex in 3 years.     Maeola Harman MD [facility]

## 2023-04-12 NOTE — Telephone Encounter (Signed)
Called patient to discuss results of stress/pet/ct, no answer, left message per DPR to call our office to discuss results.

## 2023-04-12 NOTE — Telephone Encounter (Signed)
-----   Message from Armanda Magic sent at 04/11/2023  3:40 PM EDT ----- Please let patient know that stress test was fine and has some coornary artery calcifications but normal blood flow

## 2023-04-13 ENCOUNTER — Telehealth: Payer: Self-pay | Admitting: Cardiology

## 2023-04-13 DIAGNOSIS — I10 Essential (primary) hypertension: Secondary | ICD-10-CM

## 2023-04-13 DIAGNOSIS — R9439 Abnormal result of other cardiovascular function study: Secondary | ICD-10-CM

## 2023-04-13 NOTE — Telephone Encounter (Signed)
Left detailed message (OK per DPR) with Dr. Norris Cross response and recommendation.  Also sent patient a MyChart message.  Echo ordered. Scheduler to call patient to schedule appt. Provided office number for callback.

## 2023-04-13 NOTE — Telephone Encounter (Signed)
Discussed cardiac PET stress test results with patient.  Per Dr. Mayford Knife: Please let patient know that stress test was fine and has some coornary artery calcifications but normal blood flow   Patient had questions about several abbreviations on report, was able to explain to him what each meant. He also had a questions about this part of the report: Rest left ventricular function is abnormal.   Will forward to Dr. Mayford Knife to clarify what this means for patient.

## 2023-04-13 NOTE — Telephone Encounter (Signed)
Follow Up: .    Patient is returning Erica's call from yesterday, concerning his results.

## 2023-04-20 ENCOUNTER — Other Ambulatory Visit: Payer: Medicare Other

## 2023-04-28 ENCOUNTER — Ambulatory Visit
Admission: RE | Admit: 2023-04-28 | Discharge: 2023-04-28 | Disposition: A | Discharge status: Home / Self Care | Payer: Medicare Other | Source: Ambulatory Visit | Attending: Nurse Practitioner | Admitting: Nurse Practitioner

## 2023-04-28 DIAGNOSIS — E042 Nontoxic multinodular goiter: Secondary | ICD-10-CM

## 2023-05-05 ENCOUNTER — Ambulatory Visit (HOSPITAL_COMMUNITY): Payer: Medicare Other | Attending: Cardiology

## 2023-05-05 DIAGNOSIS — R9439 Abnormal result of other cardiovascular function study: Secondary | ICD-10-CM | POA: Insufficient documentation

## 2023-05-05 DIAGNOSIS — I10 Essential (primary) hypertension: Secondary | ICD-10-CM | POA: Insufficient documentation

## 2023-05-05 LAB — ECHOCARDIOGRAM COMPLETE
Area-P 1/2: 3.68 cm2
Calc EF: 55.6 %
S' Lateral: 3 cm
Single Plane A2C EF: 60.2 %
Single Plane A4C EF: 50.5 %

## 2023-05-06 ENCOUNTER — Encounter: Payer: Self-pay | Admitting: Cardiology

## 2023-05-06 DIAGNOSIS — I340 Nonrheumatic mitral (valve) insufficiency: Secondary | ICD-10-CM | POA: Insufficient documentation

## 2023-05-09 ENCOUNTER — Ambulatory Visit: Payer: Medicare Other | Attending: Cardiovascular Disease | Admitting: Cardiovascular Disease

## 2023-05-09 VITALS — BP 100/58 | HR 51 | Ht 67.0 in | Wt 130.2 lb

## 2023-05-09 DIAGNOSIS — E785 Hyperlipidemia, unspecified: Secondary | ICD-10-CM | POA: Insufficient documentation

## 2023-05-09 DIAGNOSIS — I10 Essential (primary) hypertension: Secondary | ICD-10-CM | POA: Diagnosis present

## 2023-05-09 DIAGNOSIS — I2510 Atherosclerotic heart disease of native coronary artery without angina pectoris: Secondary | ICD-10-CM | POA: Diagnosis present

## 2023-05-09 DIAGNOSIS — I739 Peripheral vascular disease, unspecified: Secondary | ICD-10-CM | POA: Insufficient documentation

## 2023-05-09 NOTE — Patient Instructions (Signed)
Medication Instructions:  No changes *If you need a refill on your cardiac medications before your next appointment, please call your pharmacy*   Lab Work: None ordered If you have labs (blood work) drawn today and your tests are completely normal, you will receive your results only by: MyChart Message (if you have MyChart) OR A paper copy in the mail If you have any lab test that is abnormal or we need to change your treatment, we will call you to review the results.   Testing/Procedures: None ordered   Follow-Up: At [facility], you and your health needs are our priority.  As part of our continuing mission to provide you with exceptional heart care, we have created designated Provider Care Teams.  These Care Teams include your primary Cardiologist (physician) and Advanced Practice Providers (APPs -  Physician Assistants and Nurse Practitioners) who all work together to provide you with the care you need, when you need it.  We recommend signing up for the patient portal called "MyChart".  Sign up information is provided on this After Visit Summary.  MyChart is used to connect with patients for Virtual Visits (Telemedicine).  Patients are able to view lab/test results, encounter notes, upcoming appointments, etc.  Non-urgent messages can be sent to your provider as well.   To learn more about what you can do with MyChart, go to [email or web address].    Your next appointment:   Follow up as needed with Dr. Arida 

## 2023-05-09 NOTE — Progress Notes (Signed)
Cardiology Office Note   Date:  05/09/2023   ID:  Jerry Rios, DOB October 18, 1950, MRN [id number]  PCP:  Corwin Levins, MD  Cardiologist:  Dr. Mayford Knife  No chief complaint on file.     History of Present Illness: Jerry Rios is a 72 y.o. male who was referred by Dr. Mayford Knife for evaluation management of peripheral arterial disease. He has known history of CVA, carotid disease, essential hypertension and hyperlipidemia.  He is known to have moderately elevated calcium score at 684.  He has history of previous tobacco use but quit at age 25.  He has family history of coronary artery disease.  Due to elevated calcium score, he underwent a Lexiscan Myoview most recently in 2022 which showed no evidence of ischemia.  In addition, he had a PET scan done in July of this year that showed no evidence of ischemia.  He complains of bilateral leg fatigue that starts in the thigh area mostly when he is walking uphill.  Symptoms do not happen on a flat level.  He tends to exercise for 30 minutes at least 3-4 times every day.  He also goes to the gym.  He underwent lower extremity arterial Doppler in May which showed noncompressible vessels bilaterally.  Toe pressure was normal on the left but abnormal on the right at 69 mmHg.  He denies chest pain or shortness of breath.  Past Medical History:  Diagnosis Date   Abdominal pain, unspecified site 05/30/2014   Agatston coronary artery calcium score greater than 400    Allergic rhinitis 04/08/2007   Qualifier: Diagnosis of  By: Jonny Ruiz MD, Len Blalock    Allergy    Arthritis    lumbar spine   Atherosclerosis    BACK PAIN, LUMBAR 07/10/2008   Qualifier: Diagnosis of  By: Jonny Ruiz MD, Len Blalock    BENIGN PROSTATIC HYPERTROPHY 08/05/2007   Qualifier: Diagnosis of  By: Jonny Ruiz MD, Len Blalock    BPH (benign prostatic hypertrophy)    Cataract    starting- mild    Cervical radiculopathy 08/01/2017   CKD (chronic kidney disease), stage III (HCC)    COLONIC  POLYPS, HX OF 08/05/2007   Adenoma polpy per last coonscopy Sep 01, 2006, Dr Marina Goodell    Cough 11/12/2009   Qualifier: Diagnosis of  By: Yetta Barre MD, Bernadene Bell.    Depression 04/08/2007   Qualifier: Diagnosis of  By: Jonny Ruiz MD, Len Blalock    Diverticulosis of colon    DIVERTICULOSIS, COLON 08/05/2007   Qualifier: Diagnosis of  By: Jonny Ruiz MD, Len Blalock    ECZEMA, ATOPIC 11/12/2009   Qualifier: Diagnosis of  By: Yetta Barre MD, Bernadene Bell.    HEMORRHOIDS 07/10/2008   Qualifier: Diagnosis of  By: Jonny Ruiz MD, Len Blalock    History of nephrolithiasis    History of shingles    Hx of colonic polyp    Hyperglycemia 11/19/2014   Hyperlipidemia    Hypertension    some past history    Insomnia    Insomnia, unspecified 04/08/2007   Centricity Description: SYMPTOM, INSOMNIA NOS Qualifier: Diagnosis of  By: Jonny Ruiz MD, Len Blalock  Centricity Description: INSOMNIA-SLEEP DISORDER-UNSPEC Qualifier: Diagnosis of  By: Jonny Ruiz MD, Len Blalock    KNEE PAIN, LEFT 07/10/2008   Qualifier: Diagnosis of  By: Jonny Ruiz MD, Len Blalock    Libido, decreased 10/04/2011   Loss of weight 05/30/2014   Mitral regurgitation    mild to moderate by echo 04/2023   Multinodular goiter  12/14/2016   Nausea in adult patient 05/30/2014   NEPHROLITHIASIS, HX OF 08/05/2007   Qualifier: Diagnosis of  By: Jonny Ruiz MD, Len Blalock    RENAL INSUFFICIENCY 09/07/2010   Qualifier: Diagnosis of  By: Jonny Ruiz MD, Len Blalock    Right leg pain 10/04/2018   Stroke (HCC) 10/2014   TIA (transient ischemic attack)    Vitiligo 10/31/2012    Past Surgical History:  Procedure Laterality Date   CERVICAL DISC ARTHROPLASTY  12/08/2017   COLONOSCOPY     CYSTOSCOPY WITH URETEROSCOPY AND STENT PLACEMENT Left 07/04/2014   Procedure: CYSTOSCOPY WITH LEFT RETROGRADE PYLEOGRAM, LEFT URETERAL DILATION AND URETEROSCOPY, LASER LITHOTRISPY AND STENT PLACEMENT;  Surgeon: Crist Fat, MD;  Location: [facility];  Service: Urology;  Laterality: Left;   HOLMIUM LASER APPLICATION N/A 07/04/2014   Procedure:  LASER  LITHOTRIPSY;  Surgeon: Crist Fat, MD;  Location: [facility];  Service: Urology;  Laterality: N/A;   lithotrypsy     x 3   POLYPECTOMY     TONSILLECTOMY       Current Outpatient Medications  Medication Sig Dispense Refill   Barberry-Oreg Grape-Goldenseal (BERBERINE COMPLEX) 200-200-50 MG CAPS Take 1 capsule by mouth daily as needed.     bisacodyl (DULCOLAX) 5 MG EC tablet Take 5 mg by mouth daily as needed.     cetirizine (ZYRTEC) 10 MG tablet Take 10 mg by mouth daily as needed for allergies.     co-enzyme Q-10 30 MG capsule Take 100 mg by mouth as needed.     docusate sodium (COLACE) 100 MG capsule Take 100 mg by mouth daily as needed for mild constipation.     fluticasone (FLONASE) 50 MCG/ACT nasal spray USE 2 SPRAYS IN EACH  NOSTRIL DAILY 48 g 1   Magnesium 250 MG TABS Take 1 tablet by mouth daily.     Menaquinone-7 (VITAMIN K2) 100 MCG CAPS Take 1 capsule by mouth as needed.     Naphazoline-Pheniramine (OPCON-A OP) Apply 1 drop to eye as needed.     rosuvastatin (CRESTOR) 10 MG tablet TAKE 1 TABLET(10 MG) BY MOUTH DAILY 90 tablet 3   Study - ORION 4 - inclisiran 300 mg/1.57mL or placebo SQ injection (PI-Stuckey) Inject 1.5 mLs (300 mg total) into the skin every 6 (six) months. 1 mL 0   Vitamin D, Cholecalciferol, 25 MCG (1000 UT) TABS Take 1 capsule by mouth daily.     diltiazem 2 % GEL Apply 1 Application topically 2 (two) times daily. Use pea sized amount per rectum three times a day for 8 weeks 30 g 1   diltiazem 2 % GEL APPLY A PEA SIZED AMOUNT PER RECTUM THREE TIMES A DAY FOR 8 WEEKS. (Patient not taking: Reported on 05/09/2023)     metroNIDAZOLE (METROCREAM) 0.75 % cream Apply topically 2 (two) times daily. 45 g 2   tadalafil (CIALIS) 5 MG tablet Take 1 tablet (5 mg total) by mouth daily. (Patient not taking: Reported on 05/09/2023) 90 tablet 3   Current Facility-Administered Medications  Medication Dose Route Frequency Provider Last Rate Last Admin   Study - ORION 4 -  inclisiran 300 mg/1.10mL or placebo SQ injection (PI-Stuckey)  300 mg Subcutaneous Q6 months Herby Abraham, MD   300 mg at 03/28/23 4010    Allergies:   Aspirin, Clobetasol, Adhesive [tape], Crestor [rosuvastatin], Lipitor [atorvastatin], Nexlizet [bempedoic acid-ezetimibe], Praluent [alirocumab], Repatha [evolocumab], and Zocor [simvastatin]    Social History:  The patient  reports that he has quit  smoking. His smoking use included cigarettes. He has a 5 pack-year smoking history. He has never used smokeless tobacco. He reports that he does not currently use alcohol after a past usage of about 7.0 standard drinks of alcohol per week. He reports that he does not use drugs.   Family History:  The patient's family history includes CAD in his father and mother; Colitis in his sister; Heart disease in an other family member; Heart failure in his father, maternal grandfather, and mother; Hyperlipidemia in an other family member; Hypertension in an other family member; Lupus in his paternal grandfather; Ulcerative colitis in his sister.    ROS:  Please see the history of present illness.   Otherwise, review of systems are positive for none.   All other systems are reviewed and negative.    PHYSICAL EXAM: VS:  BP (!) 100/58 (BP Location: Left Arm, Patient Position: Sitting, Cuff Size: Normal)   Pulse (!) 51   Ht 5\' 7"  (1.702 m)   Wt 130 lb 3.2 oz (59.1 kg)   SpO2 98%   BMI 20.39 kg/m  , BMI Body mass index is 20.39 kg/m. GEN: Well nourished, well developed, in no acute distress  HEENT: normal  Neck: no JVD, carotid bruits, or masses Cardiac: RRR; no murmurs, rubs, or gallops,no edema  Respiratory:  clear to auscultation bilaterally, normal work of breathing GI: soft, nontender, nondistended, + BS MS: no deformity or atrophy  Skin: warm and dry, no rash Neuro:  Strength and sensation are intact Psych: euthymic mood, full affect Vascular: Femoral pulses normal bilaterally.  Dorsalis pedis  is +1 bilaterally and posterior tibialis +1 bilaterally.   EKG:  EKG is not ordered today.    Recent Labs: 12/21/2022: ALT 18; BUN 19; Creatinine, Ser 1.38; Hemoglobin 13.7; Platelets 211.0; Potassium 3.7; Sodium 139; TSH 2.16    Lipid Panel    Component Value Date/Time   CHOL 362 (H) 04/26/2022 0916   CHOL 168 03/23/2020 0000   TRIG 65.0 04/26/2022 0916   HDL 112.50 04/26/2022 0916   HDL 91 03/23/2020 0000   CHOLHDL 3 04/26/2022 0916   VLDL 13.0 04/26/2022 0916   LDLCALC 236 (H) 04/26/2022 0916   LDLCALC 69 03/23/2020 0000   LDLDIRECT 206.1 11/01/2013 0942      Wt Readings from Last 3 Encounters:  05/09/23 130 lb 3.2 oz (59.1 kg)  04/12/23 130 lb (59 kg)  03/28/23 132 lb (59.9 kg)            No data to display            ASSESSMENT AND PLAN:  1.  Peripheral arterial disease: The patient has evidence of calcified vessels with noncompressible ABI.  However, I do not see evidence of obstructive disease by physical exam as his femoral pulses are normal and distal pulses are palpable. He does have mild claudication manifested by leg fatigue with walking uphill but not on flat level.  This is likely due to calcified vessels and decreased compliance which makes the arteries less responsive to exercise.  No indication for angiography or revascularization.  Recommend continuing treatment of risk factors.  2.  Coronary artery disease without angina: Continue medical therapy.  3.  Hyperlipidemia: Known severe hyperlipidemia with elevated LDL.  He has history of poor tolerance to statins but he is currently on rosuvastatin 10 mg daily and he is enrolled in the [place] 4 study.  4.  Essential hypertension: Blood pressure is controlled.    Disposition:  FU with me as needed  Signed,  Lorine Bears, MD  05/09/2023 9:21 AM    [facility]

## 2023-05-12 ENCOUNTER — Telehealth: Payer: Self-pay

## 2023-05-12 DIAGNOSIS — I340 Nonrheumatic mitral (valve) insufficiency: Secondary | ICD-10-CM

## 2023-05-12 NOTE — Telephone Encounter (Signed)
Called patient to review echo results, no answer. Left detailed message per DPR explaining that echo showed normal heart function with mild to moderate leakiness of the MV. Advised that Dr. Mayford Knife would like to repeat echo in 1 year for MR and that order would be placed. Advised patient that someone would call him to schedule repeat echo for next year.

## 2023-05-12 NOTE — Telephone Encounter (Signed)
-----   Message from Armanda Magic sent at 05/06/2023  3:40 PM EDT ----- Echo showed normal heart function with mild to moderate leakiness of the MV>>repeat echo in 1 year for MR

## 2023-06-27 ENCOUNTER — Ambulatory Visit (INDEPENDENT_AMBULATORY_CARE_PROVIDER_SITE_OTHER): Payer: Medicare Other | Admitting: Sports Medicine

## 2023-06-27 ENCOUNTER — Ambulatory Visit (INDEPENDENT_AMBULATORY_CARE_PROVIDER_SITE_OTHER): Payer: Medicare Other

## 2023-06-27 VITALS — BP 130/86 | HR 51 | Ht 67.0 in | Wt 130.0 lb

## 2023-06-27 DIAGNOSIS — M7711 Lateral epicondylitis, right elbow: Secondary | ICD-10-CM

## 2023-06-27 DIAGNOSIS — M25521 Pain in right elbow: Secondary | ICD-10-CM

## 2023-06-27 DIAGNOSIS — M25531 Pain in right wrist: Secondary | ICD-10-CM | POA: Diagnosis not present

## 2023-06-27 DIAGNOSIS — Z23 Encounter for immunization: Secondary | ICD-10-CM

## 2023-06-27 MED ORDER — METHYLPREDNISOLONE 4 MG PO TBPK: ORAL_TABLET | ORAL | 0 refills | Status: DC

## 2023-06-27 NOTE — Patient Instructions (Signed)
Wrist brace  Wrist HEP  Prednisone dos pak  Follow up after completing dos pak for any musculoskeletal issues

## 2023-06-27 NOTE — Progress Notes (Signed)
Patient presented in office today for his HD Flu Vaccine. HD Flu Vaccine was administered into his Left Deltoid Muscle. Patient tolerated injection well and injection site looked well. Patient advised to report to office immediately if he noticed any adverse reactions.

## 2023-06-27 NOTE — Progress Notes (Signed)
Jerry Rios D.[facility] [address] [place] [address] Phone: [phone]   Assessment and Plan:     1. Right elbow pain 2. Right wrist pain 3. Right lateral epicondylitis -Chronic with exacerbation, initial sports medicine visit - Most consistent with right lateral epicondylitis stemming from overuse in May 2024 with improving but lingering symptoms - X-rays obtained in clinic.  My interpretation: No acute fracture or dislocation.  Small olecranon spurring.  Calcified density in subcutaneous layer of anterior elbow that based on physical exam and history appears to be the calcified remains of a prior hematoma from a blood draw - Recommend using wrist brace to decrease strain on wrist and elbow - Start HEP - Start prednisone Dosepak - Do not recommend NSAID use with past medical history of CKD with last GFR 51 in 11/2022   Pertinent previous records reviewed include GFR in 11/2022   Follow Up: As needed after completing prednisone course to review remaining musculoskeletal complaints   Subjective:   I, Jerry Rios, am serving as a Neurosurgeon for Doctor Richardean Sale  Chief Complaint: right elbow and wrist pain   HPI:   06/27/23 Patient is a 72 year old male complaining of right elbow and wrist pain. Patient states that he was crimping something and just over did it. Has had pain for several months. No numbness and tingling. Decrease grip strength . Pain with lifting more than 5-10 pounds. Elbow is more painful than wrist   Relevant Historical Information: CKD, history of mitral regurg  Additional pertinent review of systems negative.   Current Outpatient Medications:    Barberry-Oreg Grape-Goldenseal (BERBERINE COMPLEX) 200-200-50 MG CAPS, Take 1 capsule by mouth daily as needed., Disp: , Rfl:    bisacodyl (DULCOLAX) 5 MG EC tablet, Take 5 mg by mouth daily as needed., Disp: , Rfl:    cetirizine (ZYRTEC) 10 MG tablet, Take 10  mg by mouth daily as needed for allergies., Disp: , Rfl:    co-enzyme Q-10 30 MG capsule, Take 100 mg by mouth as needed., Disp: , Rfl:    diltiazem 2 % GEL, , Disp: , Rfl:    docusate sodium (COLACE) 100 MG capsule, Take 100 mg by mouth daily as needed for mild constipation., Disp: , Rfl:    fluticasone (FLONASE) 50 MCG/ACT nasal spray, USE 2 SPRAYS IN EACH  NOSTRIL DAILY, Disp: 48 g, Rfl: 1   Magnesium 250 MG TABS, Take 1 tablet by mouth daily., Disp: , Rfl:    Menaquinone-7 (VITAMIN K2) 100 MCG CAPS, Take 1 capsule by mouth as needed., Disp: , Rfl:    methylPREDNISolone (MEDROL DOSEPAK) 4 MG TBPK tablet, Take 6 tablets on day 1.  Take 5 tablets on day 2.  Take 4 tablets on day 3.  Take 3 tablets on day 4.  Take 2 tablets on day 5.  Take 1 tablet on day 6., Disp: 21 tablet, Rfl: 0   metroNIDAZOLE (METROCREAM) 0.75 % cream, Apply topically 2 (two) times daily., Disp: 45 g, Rfl: 2   Naphazoline-Pheniramine (OPCON-A OP), Apply 1 drop to eye as needed., Disp: , Rfl:    rosuvastatin (CRESTOR) 10 MG tablet, TAKE 1 TABLET(10 MG) BY MOUTH DAILY, Disp: 90 tablet, Rfl: 3   Study - ORION 4 - inclisiran 300 mg/1.72mL or placebo SQ injection (PI-Stuckey), Inject 1.5 mLs (300 mg total) into the skin every 6 (six) months., Disp: 1 mL, Rfl: 0   Vitamin D, Cholecalciferol, 25 MCG (1000  UT) TABS, Take 1 capsule by mouth daily., Disp: , Rfl:    diltiazem 2 % GEL, Apply 1 Application topically 2 (two) times daily. Use pea sized amount per rectum three times a day for 8 weeks, Disp: 30 g, Rfl: 1   tadalafil (CIALIS) 5 MG tablet, Take 1 tablet (5 mg total) by mouth daily. (Patient not taking: Reported on 05/09/2023), Disp: 90 tablet, Rfl: 3  Current Facility-Administered Medications:    Study - ORION 4 - inclisiran 300 mg/1.27mL or placebo SQ injection (PI-Stuckey), 300 mg, Subcutaneous, Q6 months, Shawnie Pons D, MD, 300 mg at 03/28/23 0934   Objective:     Vitals:   06/27/23 0921  BP: 130/86  Pulse: (!) 51   SpO2: 98%  Weight: 130 lb (59 kg)  Height: 5\' 7"  (1.702 m)      Body mass index is 20.36 kg/m.    Physical Exam:    General: Appears well, no acute distress, nontoxic and pleasant Neck: FROM, no pain Neuro: sensation is intact distally with no deficits, strenghth is 5/5 in elbow flexors/extenders/supinator/pronators and wrist flexors/extensors Psych: no evidence of anxiety or depression  Right elbow: No deformity, swelling or muscle wasting Normal Carrying angle ROM:0-140, supination and pronation 90 TTP lateral condyle, olecranon NTTP over triceps, ticeps tendon,  , medial epicondyle, antecubital fossa, biceps tendon, supinator, pronator Negative tinnels over cubital tunnel Mild pain with resisted wrist and middle digit extension No pain with resisted wrist flexion Mild pain with resisted supination No pain with resisted pronation Negative valgus stress Negative varus stress Negative milking maneuver    Electronically signed by:  Jerry Rios D.[facility] 10:12 AM 06/27/23

## 2023-08-30 ENCOUNTER — Encounter: Payer: Self-pay | Admitting: Podiatry

## 2023-08-30 ENCOUNTER — Ambulatory Visit (INDEPENDENT_AMBULATORY_CARE_PROVIDER_SITE_OTHER): Payer: Medicare Other

## 2023-08-30 ENCOUNTER — Ambulatory Visit (INDEPENDENT_AMBULATORY_CARE_PROVIDER_SITE_OTHER): Payer: Medicare Other | Admitting: Podiatry

## 2023-08-30 DIAGNOSIS — M21622 Bunionette of left foot: Secondary | ICD-10-CM

## 2023-08-30 DIAGNOSIS — M21621 Bunionette of right foot: Secondary | ICD-10-CM

## 2023-08-30 DIAGNOSIS — M778 Other enthesopathies, not elsewhere classified: Secondary | ICD-10-CM | POA: Diagnosis not present

## 2023-08-30 DIAGNOSIS — M7752 Other enthesopathy of left foot: Secondary | ICD-10-CM | POA: Diagnosis not present

## 2023-08-30 MED ORDER — TRIAMCINOLONE ACETONIDE 10 MG/ML IJ SUSP
10.0000 mg | Freq: Once | INTRAMUSCULAR | Status: AC
Start: 2023-08-30 — End: 2023-08-30
  Administered 2023-08-30: 10 mg via INTRA_ARTICULAR

## 2023-08-30 NOTE — Progress Notes (Signed)
Subjective:   Patient ID: Jerry Rios, male   DOB: 72 y.o.   MRN: [id number]   HPI Patient presents with pain in the left outer foot with fluid buildup and is wondering about surgery.  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  Discussed tailor's bunion deformity bilateral and I did discuss the possibility for fifth metatarsal head resection educating him on procedure and recovery.  I then for the left fifth MPJ I did do sterile prep injected the plantar lateral capsule 3 mg dexamethasone Kenalog 5 mg Xylocaine debrided lesions and we will see him back when symptomatic with long-term decision to be made  X-rays indicate that there is moderate enlargement around the head of the fifth metatarsal left foot over right foot

## 2023-09-22 ENCOUNTER — Ambulatory Visit: Payer: Self-pay | Admitting: Internal Medicine

## 2023-09-22 NOTE — Telephone Encounter (Signed)
Chief Complaint: flu like symptoms Symptoms: fever of 101,  Frequency: fever of 101, runny nose, cough, chest congestion, rattling in chest when breathing Pertinent Negatives: Patient denies body aches Disposition: [] ED /[x] Urgent Care (no appt availability in office) / [] Appointment(In office/virtual)/ []  [facility]/ [] Home Care/ [] Refused Recommended Disposition /[] [facility]/ []  Follow-up with PCP Additional Notes: Patient called in stating he is experiencing flu-like symptoms and he was around his nephew who had the flu on Christmas Day. Patient states he developed a fever this morning of around 101. Patient took an 81 mg aspirin this morning to treat fever. Advised patient against further aspirin usage during flu due to [facility] Syndrome. Patient also took mucinex at 1700 today for congestion. Patient states he has Stage 3 CKD. Advised patient to be seen in Urgent Care @ [facility] by 8 pm tonight. Patient states he will go now to be evaluated at [facility].    Copied from CRM [id number]. Topic: Clinical - Red Word Triage >> Sep 22, 2023  5:26 PM Tiffany H wrote: Red Word that prompted transfer to Nurse Triage: Patient called to advise that he started having flu-like symptoms today. As he checked his temp ten minutes prior, patient has a temp of 101.4. Red word: TEMPERATURE. Reason for Disposition  [1] Fever > 101 F (38.3 C) AND [2] age > 60 years  Answer Assessment - Initial Assessment Questions 1. TYPE of EXPOSURE: "How were you exposed?" (e.g., close contact, not a close contact)     Close contact with nephew on christmas day who had flu 2. DATE of EXPOSURE: "When did the exposure occur?" (e.g., hour, days, weeks)     December 25 4. HIGH RISK for COMPLICATIONS: "Do you have any heart or lung problems?" "Do you have a weakened immune system?" (e.g., CHF, COPD, asthma, HIV positive, chemotherapy, renal failure, diabetes mellitus, sickle cell anemia)     Patient does high  calcium score in coronary arteries, denies any other history 5. SYMPTOMS: "Do you have any symptoms?" (e.g., cough, fever, sore throat, difficulty breathing).     Last night didn't feel right, today started running fever, cough getting worst, congestion in upper chest. Fever around 100 this morning and went above 101 about 15 minutes ago.  Answer Assessment - Initial Assessment Questions 1. WORST SYMPTOM: "What is your worst symptom?" (e.g., cough, runny nose, muscle aches, headache, sore throat, fever)      Fever and dizziness 2. ONSET: "When did your flu symptoms start?"      Last night 3. COUGH: "How bad is the cough?"       Not terribly bad, but hear rattling when he coughs 4. RESPIRATORY DISTRESS: "Describe your breathing."      Rattling in chest when breathing, maybe slight wheeze 5. FEVER: "Do you have a fever?" If Yes, ask: "What is your temperature, how was it measured, and when did it start?"     Yes of 101 6. EXPOSURE: "Were you exposed to someone with influenza?"       Yes, nephew 7. FLU VACCINE: "Did you get a flu shot this year?"     Yes  8. HIGH RISK DISEASE: "Do you have any chronic medical problems?" (e.g., heart or lung disease, asthma, weak immune system, or other HIGH RISK conditions)     Only atherosclerosis, Stage 3 CKD 10. OTHER SYMPTOMS: "Do you have any other symptoms?"  (e.g., runny nose, muscle aches, headache, sore throat)  Cough, fever, congestion in chest, runny nose  Protocols used: Influenza (Flu) Exposure-A-AH, Influenza (Flu) - Seasonal-A-AH

## 2023-09-23 ENCOUNTER — Encounter: Payer: Self-pay | Admitting: Emergency Medicine

## 2023-09-23 ENCOUNTER — Ambulatory Visit
Admission: EM | Admit: 2023-09-23 | Discharge: 2023-09-23 | Disposition: A | Discharge status: Home / Self Care | Payer: Medicare Other | Attending: Internal Medicine | Admitting: Internal Medicine

## 2023-09-23 ENCOUNTER — Ambulatory Visit (INDEPENDENT_AMBULATORY_CARE_PROVIDER_SITE_OTHER): Payer: Medicare Other

## 2023-09-23 DIAGNOSIS — B349 Viral infection, unspecified: Secondary | ICD-10-CM | POA: Insufficient documentation

## 2023-09-23 DIAGNOSIS — R051 Acute cough: Secondary | ICD-10-CM

## 2023-09-23 DIAGNOSIS — J029 Acute pharyngitis, unspecified: Secondary | ICD-10-CM | POA: Insufficient documentation

## 2023-09-23 LAB — POC COVID19/FLU A&B COMBO
Covid Antigen, POC: NEGATIVE
Influenza A Antigen, POC: NEGATIVE
Influenza B Antigen, POC: NEGATIVE

## 2023-09-23 LAB — POCT RAPID STREP A (OFFICE): Rapid Strep A Screen: NEGATIVE

## 2023-09-23 MED ORDER — PROMETHAZINE-DM 6.25-15 MG/5ML PO SYRP
5.0000 mL | ORAL_SOLUTION | Freq: Three times a day (TID) | ORAL | 0 refills | Status: DC | PRN
Start: 2023-09-23 — End: 2023-10-03

## 2023-09-23 NOTE — ED Triage Notes (Signed)
Patient c/o flu like sx's that started yesterday, cough w/a rattle in his chest.  Fever 101.4, headache and sore throat.  Patient has taken Tylenol.

## 2023-09-23 NOTE — Discharge Instructions (Signed)
He may start Promethazine DM as needed for cough.  Please note this medication can make you drowsy.  Do not drink alcohol or drive while on this medication.  Continue Tylenol or ibuprofen as needed for fever management.  Lots of rest and fluids.  Please follow-up with your PCP in 2 to 3 days for recheck.  Please go to the ER if you develop any worsening symptoms.  Hope you feel better soon!

## 2023-09-23 NOTE — ED Provider Notes (Signed)
[facility]    CSN: [id number] Arrival date & time: 09/23/23  0846      History   Chief Complaint Chief Complaint  Patient presents with   Cough    HPI Jerry Rios is a 72 y.o. male  presents for evaluation of URI symptoms for 1 days. Patient reports associated symptoms of cough, congestion, sore throat, body aches, fever of 101. Denies N/V/D, ear pain, shortness of breath. Patient does not at have a hx of asthma. Patient is not an active smoker.   Reports sick contacts via family members.  Pt has taken tylenol and  Mucinex OTC for symptoms. Pt has no other concerns at this time.    Cough Associated symptoms: fever, myalgias and sore throat     Past Medical History:  Diagnosis Date   Abdominal pain, unspecified site 05/30/2014   Agatston coronary artery calcium score greater than 400    Allergic rhinitis 04/08/2007   Qualifier: Diagnosis of  By: Jonny Ruiz MD, Len Blalock    Allergy    Arthritis    lumbar spine   Atherosclerosis    BACK PAIN, LUMBAR 07/10/2008   Qualifier: Diagnosis of  By: Jonny Ruiz MD, Len Blalock    BENIGN PROSTATIC HYPERTROPHY 08/05/2007   Qualifier: Diagnosis of  By: Jonny Ruiz MD, Len Blalock    BPH (benign prostatic hypertrophy)    Cataract    starting- mild    Cervical radiculopathy 08/01/2017   CKD (chronic kidney disease), stage III (HCC)    COLONIC POLYPS, HX OF 08/05/2007   Adenoma polpy per last coonscopy Sep 01, 2006, Dr Marina Goodell    Cough 11/12/2009   Qualifier: Diagnosis of  By: Yetta Barre MD, Bernadene Bell.    Depression 04/08/2007   Qualifier: Diagnosis of  By: Jonny Ruiz MD, Len Blalock    Diverticulosis of colon    DIVERTICULOSIS, COLON 08/05/2007   Qualifier: Diagnosis of  By: Jonny Ruiz MD, Len Blalock    ECZEMA, ATOPIC 11/12/2009   Qualifier: Diagnosis of  By: Yetta Barre MD, Bernadene Bell.    HEMORRHOIDS 07/10/2008   Qualifier: Diagnosis of  By: Jonny Ruiz MD, Len Blalock    History of nephrolithiasis    History of shingles    Hx of colonic polyp    Hyperglycemia 11/19/2014    Hyperlipidemia    Hypertension    some past history    Insomnia    Insomnia, unspecified 04/08/2007   Centricity Description: SYMPTOM, INSOMNIA NOS Qualifier: Diagnosis of  By: Jonny Ruiz MD, Len Blalock  Centricity Description: INSOMNIA-SLEEP DISORDER-UNSPEC Qualifier: Diagnosis of  By: Jonny Ruiz MD, Len Blalock    KNEE PAIN, LEFT 07/10/2008   Qualifier: Diagnosis of  By: Jonny Ruiz MD, Len Blalock    Libido, decreased 10/04/2011   Loss of weight 05/30/2014   Mitral regurgitation    mild to moderate by echo 04/2023   Multinodular goiter 12/14/2016   Nausea in adult patient 05/30/2014   NEPHROLITHIASIS, HX OF 08/05/2007   Qualifier: Diagnosis of  By: Jonny Ruiz MD, Len Blalock    RENAL INSUFFICIENCY 09/07/2010   Qualifier: Diagnosis of  By: Jonny Ruiz MD, Len Blalock    Right leg pain 10/04/2018   Stroke (HCC) 10/2014   TIA (transient ischemic attack)    Vitiligo 10/31/2012    Patient Active Problem List   Diagnosis Date Noted   Mitral regurgitation    Agatston coronary artery calcium score greater than 400    Neoplasm of uncertain behavior of skin 04/26/2022   Cellulitis of left foot 04/26/2022  Skin lesion of back 03/08/2021   Pre-ulcerative corn or callous 03/08/2021   Right foot pain 03/08/2021   Lip lesion 03/08/2021   Mild chronic gastritis 03/02/2021   Neck pain 10/24/2020   Enlarged thyroid 04/02/2020   Throat pain 04/02/2020   Candidiasis, intertrigo 02/29/2020   CKD (chronic kidney disease) stage 3, GFR 30-59 ml/min (HCC) 11/13/2019   Weight loss 03/07/2019   Right leg pain 10/04/2018   Small intestinal bacterial overgrowth 10/01/2018   Cervical radiculopathy 08/01/2017   Multinodular goiter 12/14/2016   Sudden left hearing loss 03/30/2016   Tinnitus of both ears 03/30/2016   Hyperglycemia 11/19/2014   TIA (transient ischemic attack) 11/18/2014   Abdominal pain 05/30/2014   Nausea in adult patient 05/30/2014   Loss of weight 05/30/2014   Vitiligo 10/31/2012   Libido, decreased 10/04/2011    Preventative health care 10/01/2011   ECZEMA, ATOPIC 11/12/2009   HEMORRHOIDS 07/10/2008   BACK PAIN, LUMBAR 07/10/2008   Hyperlipidemia 08/05/2007   DIVERTICULOSIS, COLON 08/05/2007   BPH (benign prostatic hyperplasia) 08/05/2007   History of colonic polyps 08/05/2007   NEPHROLITHIASIS, HX OF 08/05/2007   Allergic rhinitis 04/08/2007   Insomnia, unspecified 04/08/2007    Past Surgical History:  Procedure Laterality Date   CERVICAL DISC ARTHROPLASTY  12/08/2017   COLONOSCOPY     CYSTOSCOPY WITH URETEROSCOPY AND STENT PLACEMENT Left 07/04/2014   Procedure: CYSTOSCOPY WITH LEFT RETROGRADE PYLEOGRAM, LEFT URETERAL DILATION AND URETEROSCOPY, LASER LITHOTRISPY AND STENT PLACEMENT;  Surgeon: Crist Fat, MD;  Location: [facility];  Service: Urology;  Laterality: Left;   HOLMIUM LASER APPLICATION N/A 07/04/2014   Procedure:  LASER LITHOTRIPSY;  Surgeon: Crist Fat, MD;  Location: [facility];  Service: Urology;  Laterality: N/A;   lithotrypsy     x 3   POLYPECTOMY     TONSILLECTOMY         Home Medications    Prior to Admission medications   Medication Sig Start Date End Date Taking? Authorizing Provider  Barberry-Oreg Grape-Goldenseal (BERBERINE COMPLEX) 200-200-50 MG CAPS Take 1 capsule by mouth daily as needed. 09/26/20  Yes [provider]  bisacodyl (DULCOLAX) 5 MG EC tablet Take 5 mg by mouth daily as needed.   Yes [provider]  cetirizine (ZYRTEC) 10 MG tablet Take 10 mg by mouth daily as needed for allergies.   Yes [provider]  co-enzyme Q-10 30 MG capsule Take 100 mg by mouth as needed.   Yes [provider]  diltiazem 2 % GEL Apply 1 Application topically 2 (two) times daily. Use pea sized amount per rectum three times a day for 8 weeks 07/20/22  Yes Nandigam, Eleonore Chiquito, MD  diltiazem 2 % GEL  07/20/22  Yes [provider]  docusate sodium (COLACE) 100 MG capsule Take 100 mg by mouth daily as needed for mild  constipation.   Yes [provider]  fluticasone (FLONASE) 50 MCG/ACT nasal spray USE 2 SPRAYS IN [facility]  NOSTRIL DAILY 01/15/20  Yes Corwin Levins, MD  Magnesium 250 MG TABS Take 1 tablet by mouth daily. 02/25/20  Yes [provider]  Menaquinone-7 (VITAMIN K2) 100 MCG CAPS Take 1 capsule by mouth as needed. 09/26/20  Yes [provider]  methylPREDNISolone (MEDROL DOSEPAK) 4 MG TBPK tablet Take 6 tablets on day 1.  Take 5 tablets on day 2.  Take 4 tablets on day 3.  Take 3 tablets on day 4.  Take 2 tablets on day 5.  Take  1 tablet on day 6. 06/27/23  Yes Richardean Sale, DO  metroNIDAZOLE (METROCREAM) 0.75 % cream Apply topically 2 (two) times daily. 09/05/22  Yes Corwin Levins, MD  Naphazoline-Pheniramine (OPCON-A OP) Apply 1 drop to eye as needed.   Yes [provider]  promethazine-dextromethorphan (PROMETHAZINE-DM) 6.25-15 MG/5ML syrup Take 5 mLs by mouth 3 (three) times daily as needed for cough. 09/23/23  Yes Radford Pax, NP  rosuvastatin (CRESTOR) 10 MG tablet TAKE 1 TABLET(10 MG) BY MOUTH DAILY 12/13/22  Yes Corwin Levins, MD  Study - ORION 4 - inclisiran 300 mg/1.71mL or placebo SQ injection (PI-Stuckey) Inject 1.5 mLs (300 mg total) into the skin every 6 (six) months. 12/23/21  Yes Herby Abraham, MD  tadalafil (CIALIS) 5 MG tablet Take 1 tablet (5 mg total) by mouth daily. 12/13/22  Yes Corwin Levins, MD  Vitamin D, Cholecalciferol, 25 MCG (1000 UT) TABS Take 1 capsule by mouth daily.   Yes [provider]    Family History Family History  Problem Relation Age of Onset   Ulcerative colitis Sister    Colitis Sister    Heart disease Other        Grandparents   Hyperlipidemia Other    Hypertension Other    Heart failure Mother    CAD Mother        died of CAD at age 69   Heart failure Father    CAD Father        died from cardiac arrest at 76   Lupus Paternal Grandfather    Heart failure Maternal Grandfather    Colon cancer Neg Hx     Esophageal cancer Neg Hx    Pancreatic cancer Neg Hx    Stomach cancer Neg Hx    Liver disease Neg Hx    Colon polyps Neg Hx    Rectal cancer Neg Hx    Diabetes Neg Hx     Social History Social History   Tobacco Use   Smoking status: Former    Current packs/day: 0.50    Average packs/day: 0.5 packs/day for 10.0 years (5.0 ttl pk-yrs)    Types: Cigarettes   Smokeless tobacco: Never   Tobacco comments:    Regular exercise - No  Vaping Use   Vaping status: Never Used  Substance Use Topics   Alcohol use: Not Currently    Alcohol/week: 7.0 standard drinks of alcohol    Types: 7 Cans of beer per week   Drug use: No     Allergies   Aspirin, Clobetasol, Adhesive [tape], Crestor [rosuvastatin], Lipitor [atorvastatin], Nexlizet [bempedoic acid-ezetimibe], Praluent [alirocumab], Repatha [evolocumab], and Zocor [simvastatin]   Review of Systems Review of Systems  Constitutional:  Positive for fever.  HENT:  Positive for congestion and sore throat.   Respiratory:  Positive for cough.   Musculoskeletal:  Positive for myalgias.     Physical Exam Triage Vital Signs ED Triage Vitals  Encounter Vitals Group     BP 09/23/23 0923 127/73     Systolic BP Percentile --      Diastolic BP Percentile --      Pulse Rate 09/23/23 0923 (!) 59     Resp 09/23/23 0923 18     Temp 09/23/23 0923 99.2 F (37.3 C)     Temp Source 09/23/23 0923 Oral     SpO2 09/23/23 0923 98 %     Weight 09/23/23 0924 130 lb (59 kg)     Height 09/23/23 0924  5\' 7"  (1.702 m)     Head Circumference --      Peak Flow --      Pain Score 09/23/23 0924 3     Pain Loc --      Pain Education --      Exclude from Growth Chart --    No data found.  Updated Vital Signs BP 127/73 (BP Location: Left Arm)   Pulse (!) 59   Temp 99.2 F (37.3 C) (Oral)   Resp 18   Ht 5\' 7"  (1.702 m)   Wt 130 lb (59 kg)   SpO2 98%   BMI 20.36 kg/m   Visual Acuity Right Eye Distance:   Left Eye Distance:   Bilateral  Distance:    Right Eye Near:   Left Eye Near:    Bilateral Near:     Physical Exam Vitals and nursing note reviewed.  Constitutional:      General: He is not in acute distress.    Appearance: Normal appearance. He is not ill-appearing or toxic-appearing.  HENT:     Head: Normocephalic and atraumatic.     Right Ear: Tympanic membrane and ear canal normal.     Left Ear: Tympanic membrane and ear canal normal.     Nose: Congestion present.     Mouth/Throat:     Mouth: Mucous membranes are moist.     Pharynx: Posterior oropharyngeal erythema present.  Eyes:     Pupils: Pupils are equal, round, and reactive to light.  Cardiovascular:     Rate and Rhythm: Normal rate and regular rhythm.     Heart sounds: Normal heart sounds.  Pulmonary:     Effort: Pulmonary effort is normal.     Breath sounds: Normal breath sounds.  Musculoskeletal:     Cervical back: Normal range of motion and neck supple.  Lymphadenopathy:     Cervical: No cervical adenopathy.  Skin:    General: Skin is warm and dry.  Neurological:     General: No focal deficit present.     Mental Status: He is alert and oriented to person, place, and time.  Psychiatric:        Mood and Affect: Mood normal.        Behavior: Behavior normal.      UC Treatments / Results  Labs (all labs ordered are listed, but only abnormal results are displayed) Labs Reviewed  CULTURE, GROUP A STREP [facility])  POCT RAPID STREP A (OFFICE)  POC COVID19/FLU A&B COMBO    EKG   Radiology DG Chest 2 View Result Date: 09/23/2023 CLINICAL DATA:  Cough and fever. EXAM: CHEST - 2 VIEW COMPARISON:  03/08/2019 FINDINGS: The heart size and mediastinal contours are within normal limits. Both lungs are clear. The visualized skeletal structures are unremarkable. IMPRESSION: No active cardiopulmonary disease. Electronically Signed   By: Danae Orleans M.D.   On: 09/23/2023 10:33    Procedures Procedures (including critical care  time)  Medications Ordered in UC Medications - No data to display  Initial Impression / Assessment and Plan / UC Course  I have reviewed the triage vital signs and the nursing notes.  Pertinent labs & imaging results that were available during my care of the patient were reviewed by me and considered in my medical decision making (see chart for details).     Reviewed exam and symptoms with patient.  No red flags.  Negative rapid strep, flu, COVID testing.  Will send throat culture.  Chest x-ray  negative for pneumonia.  Discussed viral illness and symptomatic treatment.  Promethazine DM as needed for cough, side effect profile reviewed.  OTC fever reducing medications as needed.  PCP follow-up 2 to 3 days for recheck.  ER precautions reviewed. Final Clinical Impressions(s) / UC Diagnoses   Final diagnoses:  Acute cough  Sore throat  Viral illness     Discharge Instructions      He may start Promethazine DM as needed for cough.  Please note this medication can make you drowsy.  Do not drink alcohol or drive while on this medication.  Continue Tylenol or ibuprofen as needed for fever management.  Lots of rest and fluids.  Please follow-up with your PCP in 2 to 3 days for recheck.  Please go to the ER if you develop any worsening symptoms.  Hope you feel better soon!     ED Prescriptions     Medication Sig Dispense Auth. Provider   promethazine-dextromethorphan (PROMETHAZINE-DM) 6.25-15 MG/5ML syrup Take 5 mLs by mouth 3 (three) times daily as needed for cough. 118 mL Radford Pax, NP      PDMP not reviewed this encounter.   Radford Pax, NP 09/23/23 1041

## 2023-09-26 LAB — CULTURE, GROUP A STREP (THRC)

## 2023-10-03 ENCOUNTER — Encounter: Payer: Medicare Other | Admitting: *Deleted

## 2023-10-03 DIAGNOSIS — Z006 Encounter for examination for normal comparison and control in clinical research program: Secondary | ICD-10-CM

## 2023-10-03 MED ORDER — STUDY - ORION 4 - INCLISIRAN 300 MG/1.5 ML OR PLACEBO SQ INJECTION (PI-STUCKEY)
300.0000 mg | INJECTION | SUBCUTANEOUS | Status: AC
  Administered 2023-10-03: 300 mg via SUBCUTANEOUS
  Filled 2023-10-03: qty 1.5

## 2023-10-03 NOTE — Research (Signed)
 Orion 4  Patient doing great No med changes    Current Outpatient Medications:    Barberry-Oreg Grape-Goldenseal (BERBERINE COMPLEX) 200-200-50 MG CAPS, Take 1 capsule by mouth daily as needed., Disp: , Rfl:    bisacodyl (DULCOLAX) 5 MG EC tablet, Take 5 mg by mouth daily as needed., Disp: , Rfl:    co-enzyme Q-10 30 MG capsule, Take 100 mg by mouth as needed., Disp: , Rfl:    diltiazem  2 % GEL, Apply 1 Application topically 2 (two) times daily. Use pea sized amount per rectum three times a day for 8 weeks, Disp: 30 g, Rfl: 1   docusate sodium (COLACE) 100 MG capsule, Take 100 mg by mouth daily as needed for mild constipation., Disp: , Rfl:    Magnesium 250 MG TABS, Take 1 tablet by mouth daily., Disp: , Rfl:    Menaquinone-7 (VITAMIN K2) 100 MCG CAPS, Take 1 capsule by mouth as needed., Disp: , Rfl:    metroNIDAZOLE  (METROCREAM ) 0.75 % cream, Apply topically 2 (two) times daily., Disp: 45 g, Rfl: 2   Naphazoline-Pheniramine (OPCON-A OP), Apply 1 drop to eye as needed., Disp: , Rfl:    rosuvastatin  (CRESTOR ) 10 MG tablet, TAKE 1 TABLET(10 MG) BY MOUTH DAILY, Disp: 90 tablet, Rfl: 3   Study - ORION 4 - inclisiran 300 mg/1.5mL or placebo SQ injection (PI-Stuckey), Inject 1.5 mLs (300 mg total) into the skin every 6 (six) months., Disp: 1 mL, Rfl: 0   Vitamin D , Cholecalciferol, 25 MCG (1000 UT) TABS, Take 1 capsule by mouth daily., Disp: , Rfl:    cetirizine  (ZYRTEC ) 10 MG tablet, Take 10 mg by mouth daily as needed for allergies. (Patient not taking: Reported on 10/03/2023), Disp: , Rfl:    fluticasone  (FLONASE ) 50 MCG/ACT nasal spray, USE 2 SPRAYS IN EACH  NOSTRIL DAILY (Patient not taking: Reported on 10/03/2023), Disp: 48 g, Rfl: 1   tadalafil  (CIALIS ) 5 MG tablet, Take 1 tablet (5 mg total) by mouth daily. (Patient not taking: Reported on 10/03/2023), Disp: 90 tablet, Rfl: 3  Current Facility-Administered Medications:    Study - ORION 4 - inclisiran 300 mg/1.5mL or placebo SQ injection  (PI-Stuckey), 300 mg, Subcutaneous, Q6 months, , 300 mg at 10/03/23 1236

## 2023-10-09 NOTE — Telephone Encounter (Signed)
 Unfortunately I  believe the patient primary insurance is traditional medicare, which does not pay for labs done prior to visit   this may be have been done by mistake in the past but the lab then just has to write off the cost and unable to bill for this.     thanks

## 2023-10-09 NOTE — Telephone Encounter (Signed)
 Copied from CRM [id number]. Topic: Clinical - Lab/Test Results >> Oct 06, 2023  3:27 PM Corin V wrote: Reason for CRM: Patient is wanting to complete all lab panels that he completes annually. He stated the only one he can't do right now is a cholesterol test due  to being in a clinical trial. Please call patient to schedule once labs are ordered.

## 2023-10-09 NOTE — Telephone Encounter (Signed)
 Called and left voice mail

## 2023-10-26 ENCOUNTER — Ambulatory Visit (INDEPENDENT_AMBULATORY_CARE_PROVIDER_SITE_OTHER): Payer: Medicare Other | Admitting: Internal Medicine

## 2023-10-26 ENCOUNTER — Encounter: Payer: Self-pay | Admitting: Internal Medicine

## 2023-10-26 ENCOUNTER — Ambulatory Visit (INDEPENDENT_AMBULATORY_CARE_PROVIDER_SITE_OTHER): Payer: Medicare Other

## 2023-10-26 VITALS — BP 110/62 | HR 50 | Temp 97.8°F | Ht 67.0 in | Wt 135.0 lb

## 2023-10-26 DIAGNOSIS — M5416 Radiculopathy, lumbar region: Secondary | ICD-10-CM | POA: Diagnosis not present

## 2023-10-26 DIAGNOSIS — E611 Iron deficiency: Secondary | ICD-10-CM

## 2023-10-26 DIAGNOSIS — E538 Deficiency of other specified B group vitamins: Secondary | ICD-10-CM

## 2023-10-26 DIAGNOSIS — E041 Nontoxic single thyroid nodule: Secondary | ICD-10-CM

## 2023-10-26 DIAGNOSIS — R634 Abnormal weight loss: Secondary | ICD-10-CM

## 2023-10-26 DIAGNOSIS — R739 Hyperglycemia, unspecified: Secondary | ICD-10-CM

## 2023-10-26 DIAGNOSIS — E559 Vitamin D deficiency, unspecified: Secondary | ICD-10-CM

## 2023-10-26 DIAGNOSIS — R972 Elevated prostate specific antigen [PSA]: Secondary | ICD-10-CM | POA: Diagnosis not present

## 2023-10-26 DIAGNOSIS — E7800 Pure hypercholesterolemia, unspecified: Secondary | ICD-10-CM | POA: Diagnosis not present

## 2023-10-26 MED ORDER — ROSUVASTATIN CALCIUM 10 MG PO TABS: ORAL_TABLET | ORAL | 3 refills | Status: DC

## 2023-10-26 NOTE — Progress Notes (Signed)
Patient ID: Jerry Rios, male   DOB: 07-22-1951, 73 y.o.   MRN: [id number]         Chief Complaint:: yearly exam       HPI:  Jerry Rios is a 73 y.o. male here overall doing ok; Pt denies chest pain, increased sob or doe, wheezing, orthopnea, PND, increased LE swelling, palpitations, dizziness or syncope.   Pt denies polydipsia, polyuria, or new focal neuro s/s.    Pt denies fever, wt loss, night sweats, loss of appetite, or other constitutional symptoms  Denies hyper or hypo thyroid symptoms such as voice, skin or hair change.  Denies urinary symptoms such as dysuria, frequency, urgency, flank pain, hematuria or n/v, fever, chills.  Has had recent wt loss as well.  Also has worsening right LBP with raidation to the RLE with mild weakness Wt Readings from Last 3 Encounters:  10/26/23 135 lb (61.2 kg)  10/03/23 130 lb (59 kg)  09/23/23 130 lb (59 kg)   BP Readings from Last 3 Encounters:  10/26/23 110/62  10/03/23 (!) 103/49  09/23/23 127/73   Immunization History  Administered Date(s) Administered   Fluad Quad(high Dose 65+) 06/08/2019   Fluad Trivalent(High Dose 65+) 06/27/2023   Influenza Whole 06/26/2009   Influenza, Seasonal, Injecte, Preservative Fre 06/26/2013   Influenza,inj,Quad PF,6+ Mos 09/27/2019   Influenza-Unspecified 06/08/2017, 08/10/2018, 07/02/2020, 08/04/2021, 06/17/2022   Moderna Covid-19 Fall Seasonal Vaccine 3yrs & older 06/24/2022   PFIZER(Purple Top)SARS-COV-2 Vaccination 12/11/2019, 12/31/2019, 07/23/2020, 01/17/2021, 06/08/2021   Pfizer(Comirnaty)Fall Seasonal Vaccine 12 years and older 06/08/2022, 06/20/2023   Pneumococcal Conjugate-13 12/14/2016   Pneumococcal Polysaccharide-23 10/04/2018   Respiratory Syncytial Virus Vaccine,Recomb Aduvanted(Arexvy) 09/06/2022   Td 09/27/1995, 07/13/2009   Tdap 10/08/2019   Zoster, Live 09/07/2010   Health Maintenance Due  Topic Date Due   Medicare Annual Wellness (AWV)  09/06/2023      Past Medical  History:  Diagnosis Date   Abdominal pain, unspecified site 05/30/2014   Agatston coronary artery calcium score greater than 400    Allergic rhinitis 04/08/2007   Qualifier: Diagnosis of  By: Jonny Ruiz MD, Len Blalock    Allergy    Arthritis    lumbar spine   Atherosclerosis    BACK PAIN, LUMBAR 07/10/2008   Qualifier: Diagnosis of  By: Jonny Ruiz MD, Len Blalock    BENIGN PROSTATIC HYPERTROPHY 08/05/2007   Qualifier: Diagnosis of  By: Jonny Ruiz MD, Len Blalock    BPH (benign prostatic hypertrophy)    Cataract    starting- mild    Cervical radiculopathy 08/01/2017   CKD (chronic kidney disease), stage III (HCC)    COLONIC POLYPS, HX OF 08/05/2007   Adenoma polpy per last coonscopy Sep 01, 2006, Dr Marina Goodell    Cough 11/12/2009   Qualifier: Diagnosis of  By: Yetta Barre MD, Bernadene Bell.    Depression 04/08/2007   Qualifier: Diagnosis of  By: Jonny Ruiz MD, Len Blalock    Diverticulosis of colon    DIVERTICULOSIS, COLON 08/05/2007   Qualifier: Diagnosis of  By: Jonny Ruiz MD, Len Blalock    ECZEMA, ATOPIC 11/12/2009   Qualifier: Diagnosis of  By: Yetta Barre MD, Bernadene Bell.    HEMORRHOIDS 07/10/2008   Qualifier: Diagnosis of  By: Jonny Ruiz MD, Len Blalock    History of nephrolithiasis    History of shingles    Hx of colonic polyp    Hyperglycemia 11/19/2014   Hyperlipidemia    Hypertension    some past history    Insomnia    Insomnia,  unspecified 04/08/2007   Centricity Description: SYMPTOM, INSOMNIA NOS Qualifier: Diagnosis of  By: Jonny Ruiz MD, Len Blalock  Centricity Description: INSOMNIA-SLEEP DISORDER-UNSPEC Qualifier: Diagnosis of  By: Jonny Ruiz MD, Len Blalock    KNEE PAIN, LEFT 07/10/2008   Qualifier: Diagnosis of  By: Jonny Ruiz MD, Len Blalock    Libido, decreased 10/04/2011   Loss of weight 05/30/2014   Mitral regurgitation    mild to moderate by echo 04/2023   Multinodular goiter 12/14/2016   Nausea in adult patient 05/30/2014   NEPHROLITHIASIS, HX OF 08/05/2007   Qualifier: Diagnosis of  By: Jonny Ruiz MD, Len Blalock    RENAL INSUFFICIENCY 09/07/2010   Qualifier:  Diagnosis of  By: Jonny Ruiz MD, Len Blalock    Right leg pain 10/04/2018   Stroke (HCC) 10/2014   TIA (transient ischemic attack)    Vitiligo 10/31/2012   Past Surgical History:  Procedure Laterality Date   CERVICAL DISC ARTHROPLASTY  12/08/2017   COLONOSCOPY     CYSTOSCOPY WITH URETEROSCOPY AND STENT PLACEMENT Left 07/04/2014   Procedure: CYSTOSCOPY WITH LEFT RETROGRADE PYLEOGRAM, LEFT URETERAL DILATION AND URETEROSCOPY, LASER LITHOTRISPY AND STENT PLACEMENT;  Surgeon: Crist Fat, MD;  Location: [facility];  Service: Urology;  Laterality: Left;   HOLMIUM LASER APPLICATION N/A 07/04/2014   Procedure:  LASER LITHOTRIPSY;  Surgeon: Crist Fat, MD;  Location: [facility];  Service: Urology;  Laterality: N/A;   lithotrypsy     x 3   POLYPECTOMY     TONSILLECTOMY      reports that he has quit smoking. His smoking use included cigarettes. He has a 5 pack-year smoking history. He has never used smokeless tobacco. He reports that he does not currently use alcohol after a past usage of about 7.0 standard drinks of alcohol per week. He reports that he does not use drugs. family history includes CAD in his father and mother; Colitis in his sister; Heart disease in an other family member; Heart failure in his father, maternal grandfather, and mother; Hyperlipidemia in an other family member; Hypertension in an other family member; Lupus in his paternal grandfather; Ulcerative colitis in his sister. Allergies  Allergen Reactions   Aspirin Other (See Comments)    Causes Stomach Upset   Clobetasol     Other reaction(s): Unknown   Adhesive [Tape] Rash   Crestor [Rosuvastatin]     REACTION: myalgia   Lipitor [Atorvastatin]     REACTION: myalgia   Nexlizet [Bempedoic Acid-Ezetimibe] Itching    Myalgia in fingers, hands, feet   Praluent [Alirocumab]     ineffective   Repatha [Evolocumab]     ineffective   Zocor [Simvastatin]     REACTION: myalgia   Current Outpatient Medications on File Prior to  Visit  Medication Sig Dispense Refill   Barberry-Oreg Grape-Goldenseal (BERBERINE COMPLEX) 200-200-50 MG CAPS Take 1 capsule by mouth daily as needed.     bisacodyl (DULCOLAX) 5 MG EC tablet Take 5 mg by mouth daily as needed.     co-enzyme Q-10 30 MG capsule Take 100 mg by mouth as needed.     diltiazem 2 % GEL Apply 1 Application topically 2 (two) times daily. Use pea sized amount per rectum three times a day for 8 weeks 30 g 1   docusate sodium (COLACE) 100 MG capsule Take 100 mg by mouth daily as needed for mild constipation.     Magnesium 250 MG TABS Take 1 tablet by mouth daily.     Menaquinone-7 (VITAMIN K2) 100 MCG  CAPS Take 1 capsule by mouth as needed.     metroNIDAZOLE (METROCREAM) 0.75 % cream Apply topically 2 (two) times daily. 45 g 2   Naphazoline-Pheniramine (OPCON-A OP) Apply 1 drop to eye as needed.     Study - ORION 4 - inclisiran 300 mg/1.68mL or placebo SQ injection (PI-Stuckey) Inject 1.5 mLs (300 mg total) into the skin every 6 (six) months. 1 mL 0   Vitamin D, Cholecalciferol, 25 MCG (1000 UT) TABS Take 1 capsule by mouth daily.     cetirizine (ZYRTEC) 10 MG tablet Take 10 mg by mouth daily as needed for allergies. (Patient not taking: Reported on 10/03/2023)     Current Facility-Administered Medications on File Prior to Visit  Medication Dose Route Frequency Provider Last Rate Last Admin   Study - ORION 4 - inclisiran 300 mg/1.65mL or placebo SQ injection (PI-Stuckey)  300 mg Subcutaneous Q6 months    300 mg at 10/03/23 1236        ROS:  All others reviewed and negative.  Objective        PE:  BP 110/62 (BP Location: Right Arm, Patient Position: Sitting, Cuff Size: Normal)   Pulse (!) 50   Temp 97.8 F (36.6 C) (Oral)   Ht 5\' 7"  (1.702 m)   Wt 135 lb (61.2 kg)   SpO2 99%   BMI 21.14 kg/m                 Constitutional: Pt appears in NAD               HENT: Head: NCAT.                Right Ear: External ear normal.                 Left Ear: External ear normal.                 Eyes: . Pupils are equal, round, and reactive to light. Conjunctivae and EOM are normal               Nose: without d/c or deformity               Neck: Neck supple. Gross normal ROM               Cardiovascular: Normal rate and regular rhythm.                 Pulmonary/Chest: Effort normal and breath sounds without rales or wheezing.                Abd:  Soft, NT, ND, + BS, no organomegaly               Neurological: Pt is alert. At baseline orientation, motor with 4/5 RLE weakness               Skin: Skin is warm. No rashes, no other new lesions, LE edema - none               Psychiatric: Pt behavior is normal without agitation   Micro: none  Cardiac tracings I have personally interpreted today:  none  Pertinent Radiological findings (summarize): none   Lab Results  Component Value Date   WBC 4.6 12/21/2022   HGB 13.7 12/21/2022   HCT 40.3 12/21/2022   PLT 211.0 12/21/2022   GLUCOSE 94 12/21/2022   CHOL 362 (H) 04/26/2022   TRIG 65.0 04/26/2022   HDL 112.50 04/26/2022  LDLDIRECT 206.1 11/01/2013   LDLCALC 236 (H) 04/26/2022   ALT 18 12/21/2022   AST 24 12/21/2022   NA 139 12/21/2022   K 3.7 12/21/2022   CL 101 12/21/2022   CREATININE 1.38 12/21/2022   BUN 19 12/21/2022   CO2 28 12/21/2022   TSH 2.16 12/21/2022   PSA 2.30 12/21/2022   HGBA1C 5.9 12/21/2022   Assessment/Plan:  Jerry Rios is a 73 y.o. White or Caucasian [1] male with  has a past medical history of Abdominal pain, unspecified site (05/30/2014), Agatston coronary artery calcium score greater than 400, Allergic rhinitis (04/08/2007), Allergy, Arthritis, Atherosclerosis, BACK PAIN, LUMBAR (07/10/2008), BENIGN PROSTATIC HYPERTROPHY (08/05/2007), BPH (benign prostatic hypertrophy), Cataract, Cervical radiculopathy (08/01/2017), CKD (chronic kidney disease), stage III (HCC), COLONIC POLYPS, HX OF (08/05/2007), Cough (11/12/2009), Depression (04/08/2007), Diverticulosis of colon,  DIVERTICULOSIS, COLON (08/05/2007), ECZEMA, ATOPIC (11/12/2009), HEMORRHOIDS (07/10/2008), History of nephrolithiasis, History of shingles, colonic polyp, Hyperglycemia (11/19/2014), Hyperlipidemia, Hypertension, Insomnia, Insomnia, unspecified (04/08/2007), KNEE PAIN, LEFT (07/10/2008), Libido, decreased (10/04/2011), Loss of weight (05/30/2014), Mitral regurgitation, Multinodular goiter (12/14/2016), Nausea in adult patient (05/30/2014), NEPHROLITHIASIS, HX OF (08/05/2007), RENAL INSUFFICIENCY (09/07/2010), Right leg pain (10/04/2018), Stroke (HCC) (10/2014), TIA (transient ischemic attack), and Vitiligo (10/31/2012).  Hyperglycemia Lab Results  Component Value Date   HGBA1C 5.9 12/21/2022   Stable, pt to continue current medical treatment  - diet, wt control   Hyperlipidemia Lab Results  Component Value Date   LDLCALC 236 (H) 04/26/2022   Severe uncontrolled, delcines statin or other for now, for f/u lab, low chol diet   Weight loss Also for cxr  Thyroid nodule Also for f/u thyroid panel  Increased prostate specific antigen (PSA) velocity Lab Results  Component Value Date   PSA 2.30 12/21/2022   PSA 1.21 11/04/2021   PSA 1.66 10/03/2019   Midl increased, cont to follow next visit  Right lumbar radiculopathy Mild worsening, for MRI ls spine, and consdier f/u Dr Danielle Dess NS  Followup: Return in about 6 months (around 04/24/2024).  Oliver Barre, MD 10/29/2023 8:53 PM [facility]

## 2023-10-26 NOTE — Patient Instructions (Signed)
Please continue all other medications as before, and refills have been done if requested.  Please have the pharmacy call with any other refills you may need.  Please continue your efforts at being more active, low cholesterol diet, and weight control.  You are otherwise up to date with prevention measures today.  Please keep your appointments with your specialists as you may have planned  You will be contacted regarding the referral for: MRI LS spine  Please go to the XRAY Department in the first floor for the x-ray testing  Please go to the LAB at the blood drawing area for the tests to be done  You will be contacted by phone if any changes need to be made immediately.  Otherwise, you will receive a letter about your results with an explanation, but please check with MyChart first.  Please make an Appointment to return in 6 months, or sooner if needed

## 2023-10-29 ENCOUNTER — Encounter: Payer: Self-pay | Admitting: Internal Medicine

## 2023-10-29 DIAGNOSIS — R972 Elevated prostate specific antigen [PSA]: Secondary | ICD-10-CM | POA: Insufficient documentation

## 2023-10-29 DIAGNOSIS — M5416 Radiculopathy, lumbar region: Secondary | ICD-10-CM | POA: Insufficient documentation

## 2023-10-29 DIAGNOSIS — E041 Nontoxic single thyroid nodule: Secondary | ICD-10-CM | POA: Insufficient documentation

## 2023-10-29 NOTE — Assessment & Plan Note (Signed)
Mild worsening, for MRI ls spine, and consdier f/u Dr Danielle Dess NS

## 2023-10-29 NOTE — Assessment & Plan Note (Signed)
Lab Results  Component Value Date   PSA 2.30 12/21/2022   PSA 1.21 11/04/2021   PSA 1.66 10/03/2019   Midl increased, cont to follow next visit

## 2023-10-29 NOTE — Assessment & Plan Note (Signed)
Also for f/u thyroid panel

## 2023-10-29 NOTE — Assessment & Plan Note (Signed)
Also for cxr

## 2023-10-29 NOTE — Assessment & Plan Note (Addendum)
Lab Results  Component Value Date   LDLCALC 236 (H) 04/26/2022   Severe uncontrolled, delcines statin or other for now, for f/u lab, low chol diet

## 2023-10-29 NOTE — Assessment & Plan Note (Signed)
Lab Results  Component Value Date   HGBA1C 5.9 12/21/2022   Stable, pt to continue current medical treatment  - diet, wt control

## 2023-10-31 ENCOUNTER — Encounter: Payer: Self-pay | Admitting: Internal Medicine

## 2023-10-31 ENCOUNTER — Other Ambulatory Visit (INDEPENDENT_AMBULATORY_CARE_PROVIDER_SITE_OTHER): Payer: Medicare Other

## 2023-10-31 DIAGNOSIS — E611 Iron deficiency: Secondary | ICD-10-CM | POA: Diagnosis not present

## 2023-10-31 DIAGNOSIS — E559 Vitamin D deficiency, unspecified: Secondary | ICD-10-CM | POA: Diagnosis not present

## 2023-10-31 DIAGNOSIS — E538 Deficiency of other specified B group vitamins: Secondary | ICD-10-CM

## 2023-10-31 DIAGNOSIS — R972 Elevated prostate specific antigen [PSA]: Secondary | ICD-10-CM

## 2023-10-31 DIAGNOSIS — E7800 Pure hypercholesterolemia, unspecified: Secondary | ICD-10-CM

## 2023-10-31 DIAGNOSIS — R739 Hyperglycemia, unspecified: Secondary | ICD-10-CM | POA: Diagnosis not present

## 2023-10-31 DIAGNOSIS — E041 Nontoxic single thyroid nodule: Secondary | ICD-10-CM

## 2023-10-31 LAB — CBC WITH DIFFERENTIAL/PLATELET
Basophils Absolute: 0 10*3/uL (ref 0.0–0.1)
Basophils Relative: 0.5 % (ref 0.0–3.0)
Eosinophils Absolute: 0.1 10*3/uL (ref 0.0–0.7)
Eosinophils Relative: 1.3 % (ref 0.0–5.0)
HCT: 41.1 % (ref 39.0–52.0)
Hemoglobin: 13.9 g/dL (ref 13.0–17.0)
Lymphocytes Relative: 24.9 % (ref 12.0–46.0)
Lymphs Abs: 1.9 10*3/uL (ref 0.7–4.0)
MCHC: 33.9 g/dL (ref 30.0–36.0)
MCV: 93.7 fL (ref 78.0–100.0)
Monocytes Absolute: 0.8 10*3/uL (ref 0.1–1.0)
Monocytes Relative: 10.3 % (ref 3.0–12.0)
Neutro Abs: 4.8 10*3/uL (ref 1.4–7.7)
Neutrophils Relative %: 63 % (ref 43.0–77.0)
Platelets: 198 10*3/uL (ref 150.0–400.0)
RBC: 4.38 Mil/uL (ref 4.22–5.81)
RDW: 14.9 % (ref 11.5–15.5)
WBC: 7.6 10*3/uL (ref 4.0–10.5)

## 2023-10-31 LAB — BASIC METABOLIC PANEL
BUN: 20 mg/dL (ref 6–23)
CO2: 30 meq/L (ref 19–32)
Calcium: 9.5 mg/dL (ref 8.4–10.5)
Chloride: 102 meq/L (ref 96–112)
Creatinine, Ser: 1.28 mg/dL (ref 0.40–1.50)
GFR: 55.65 mL/min — ABNORMAL LOW (ref >60.00)
Glucose, Bld: 88 mg/dL (ref 70–99)
Potassium: 4.2 meq/L (ref 3.5–5.1)
Sodium: 140 meq/L (ref 135–145)

## 2023-10-31 LAB — HEPATIC FUNCTION PANEL
ALT: 27 U/L (ref 0–53)
AST: 29 U/L (ref 0–37)
Albumin: 4.4 g/dL (ref 3.5–5.2)
Alkaline Phosphatase: 41 U/L (ref 39–117)
Bilirubin, Direct: 0.1 mg/dL (ref 0.0–0.3)
Total Bilirubin: 0.6 mg/dL (ref 0.2–1.2)
Total Protein: 6.9 g/dL (ref 6.0–8.3)

## 2023-10-31 LAB — IBC PANEL
Iron: 62 ug/dL (ref 42–165)
Saturation Ratios: 15.7 % — ABNORMAL LOW (ref 20.0–50.0)
TIBC: 394.8 ug/dL (ref 250.0–450.0)
Transferrin: 282 mg/dL (ref 212.0–360.0)

## 2023-10-31 LAB — URINALYSIS, ROUTINE W REFLEX MICROSCOPIC
Bilirubin Urine: NEGATIVE
Hgb urine dipstick: NEGATIVE
Ketones, ur: NEGATIVE
Leukocytes,Ua: NEGATIVE
Nitrite: NEGATIVE
RBC / HPF: NONE SEEN (ref 0–?)
Specific Gravity, Urine: <=1.005 — AB (ref 1.000–1.030)
Total Protein, Urine: NEGATIVE
Urine Glucose: NEGATIVE
Urobilinogen, UA: 0.2 (ref 0.0–1.0)
WBC, UA: NONE SEEN (ref 0–?)
pH: 6 (ref 5.0–8.0)

## 2023-10-31 LAB — VITAMIN B12: Vitamin B-12: 623 pg/mL (ref 211–911)

## 2023-10-31 LAB — FERRITIN: Ferritin: 44.6 ng/mL (ref 22.0–322.0)

## 2023-10-31 LAB — HEMOGLOBIN A1C: Hgb A1c MFr Bld: 5.8 % (ref 4.6–6.5)

## 2023-10-31 LAB — PSA: PSA: 1.85 ng/mL (ref 0.10–4.00)

## 2023-10-31 LAB — VITAMIN D 25 HYDROXY (VIT D DEFICIENCY, FRACTURES): VITD: 56.66 ng/mL (ref 30.00–100.00)

## 2023-10-31 NOTE — Progress Notes (Signed)
 The test results show that your current treatment is OK, as the tests are stable.  Please continue the same plan.  There is no other need for change of treatment or further evaluation based on these results, at this time.  thanks

## 2023-11-01 LAB — THYROID PANEL WITH TSH
Free Thyroxine Index: 2 (ref 1.4–3.8)
T3 Uptake: 34 % (ref 22–35)
T4, Total: 6 ug/dL (ref 4.9–10.5)
TSH: 1.7 m[IU]/L (ref 0.40–4.50)

## 2023-11-06 ENCOUNTER — Encounter: Payer: Self-pay | Admitting: Internal Medicine

## 2024-01-09 ENCOUNTER — Ambulatory Visit

## 2024-01-09 ENCOUNTER — Telehealth: Payer: Self-pay | Admitting: Internal Medicine

## 2024-01-09 VITALS — BP 122/60 | HR 66 | Ht 65.5 in | Wt 136.6 lb

## 2024-01-09 DIAGNOSIS — Z0000 Encounter for general adult medical examination without abnormal findings: Secondary | ICD-10-CM

## 2024-01-09 DIAGNOSIS — H9192 Unspecified hearing loss, left ear: Secondary | ICD-10-CM | POA: Diagnosis not present

## 2024-01-09 NOTE — Patient Instructions (Addendum)
 Jerry Rios , Thank you for taking time to come for your Medicare Wellness Visit. I appreciate your ongoing commitment to your health goals. Please review the following plan we discussed and let me know if I can assist you in the future.   Referrals/Orders/Follow-Ups/Clinician Recommendations: Aim for 30 minutes of exercise or brisk walking, 6-8 glasses of water, and 5 servings of fruits and vegetables each day.   This is a list of the screening recommended for you and due dates:  Health Maintenance  Topic Date Due   Flu Shot  04/26/2024   Medicare Annual Wellness Visit  01/08/2025   Colon Cancer Screening  01/04/2027   DTaP/Tdap/Td vaccine (4 - Td or Tdap) 10/07/2029   Pneumonia Vaccine  Completed   Hepatitis C Screening  Completed   HPV Vaccine  Aged Out   Meningitis B Vaccine  Aged Out   COVID-19 Vaccine  Discontinued   Zoster (Shingles) Vaccine  Discontinued    Advanced directives: (Provided) Advance directive discussed with you today. I have provided a copy for you to complete at home and have notarized. Once this is complete, please bring a copy in to our office so we can scan it into your chart.   Next Medicare Annual Wellness Visit scheduled for next year: Yes

## 2024-01-09 NOTE — Progress Notes (Signed)
 Subjective:   Jerry Rios is a 73 y.o. who presents for a Medicare Wellness preventive visit.  Visit Complete: In person  Persons Participating in Visit: Patient.  AWV Questionnaire: Yes: Patient Medicare AWV questionnaire was completed by the patient on 01/09/2024; I have confirmed that all information answered by patient is correct and no changes since this date.  Cardiac Risk Factors include: dyslipidemia;advanced age (>66men, >16 women);male gender     Objective:    Today's Vitals   01/09/24 1540  BP: 122/60  Pulse: 66  SpO2: 97%  Weight: 136 lb 9.6 oz (62 kg)  Height: 5' 5.5" (1.664 m)   Body mass index is 22.39 kg/m.     01/09/2024    3:39 PM 01/10/2023    9:44 AM 09/05/2022    2:49 PM 08/16/2022   12:38 PM 08/22/2020   10:40 PM 01/03/2017   10:24 AM 12/21/2016    4:05 PM  Advanced Directives  Does Patient Have a Medical Advance Directive? No Yes Yes Yes No No No  Type of Special educational needs teacher of [place];Living will Living will      Does patient want to make changes to medical advance directive?  No - Patient declined  No - Patient declined     Would patient like information on creating a medical advance directive? Yes (MAU/Ambulatory/Procedural Areas - Information given)          Current Medications (verified) Outpatient Encounter Medications as of 01/09/2024  Medication Sig   Barberry-Oreg Grape-Goldenseal (BERBERINE COMPLEX) 200-200-50 MG CAPS Take 1 capsule by mouth daily as needed.   bisacodyl (DULCOLAX) 5 MG EC tablet Take 5 mg by mouth daily as needed.   cetirizine (ZYRTEC) 10 MG tablet Take 10 mg by mouth daily as needed for allergies.   co-enzyme Q-10 30 MG capsule Take 100 mg by mouth as needed.   diltiazem 2 % GEL Apply 1 Application topically 2 (two) times daily. Use pea sized amount per rectum three times a day for 8 weeks   docusate sodium (COLACE) 100 MG capsule Take 100 mg by mouth daily as needed for mild constipation.    Magnesium 250 MG TABS Take 1 tablet by mouth daily.   Menaquinone-7 (VITAMIN K2) 100 MCG CAPS Take 1 capsule by mouth as needed.   metroNIDAZOLE (METROCREAM) 0.75 % cream Apply topically 2 (two) times daily.   Naphazoline-Pheniramine (OPCON-A OP) Apply 1 drop to eye as needed.   rosuvastatin (CRESTOR) 10 MG tablet TAKE 1 TABLET(10 MG) BY MOUTH DAILY   Study - ORION 4 - inclisiran 300 mg/1.66mL or placebo SQ injection (PI-Stuckey) Inject 1.5 mLs (300 mg total) into the skin every 6 (six) months.   Vitamin D, Cholecalciferol, 25 MCG (1000 UT) TABS Take 1 capsule by mouth daily.   Facility-Administered Encounter Medications as of 01/09/2024  Medication   Study - ORION 4 - inclisiran 300 mg/1.58mL or placebo SQ injection (PI-Stuckey)    Allergies (verified) Aspirin, Clobetasol, Adhesive [tape], Crestor [rosuvastatin], Lipitor [atorvastatin], Nexlizet [bempedoic acid-ezetimibe], Praluent [alirocumab], Repatha [evolocumab], and Zocor [simvastatin]   History: Past Medical History:  Diagnosis Date   Abdominal pain, unspecified site 05/30/2014   Agatston coronary artery calcium score greater than 400    Allergic rhinitis 04/08/2007   Qualifier: Diagnosis of  By: Jonny Ruiz MD, Len Blalock    Allergy    Arthritis    lumbar spine   Atherosclerosis    BACK PAIN, LUMBAR 07/10/2008   Qualifier: Diagnosis of  By:  Autry Legions MD, Alveda Aures    BENIGN PROSTATIC HYPERTROPHY 08/05/2007   Qualifier: Diagnosis of  By: Autry Legions MD, Alveda Aures    BPH (benign prostatic hypertrophy)    Cataract    starting- mild    Cervical radiculopathy 08/01/2017   CKD (chronic kidney disease), stage III (HCC)    COLONIC POLYPS, HX OF 08/05/2007   Adenoma polpy per last coonscopy Sep 01, 2006, Dr Elvin Hammer    Cough 11/12/2009   Qualifier: Diagnosis of  By: Rochelle Chu MD, Randa Burton.    Depression 04/08/2007   Qualifier: Diagnosis of  By: Autry Legions MD, Alveda Aures    Diverticulosis of colon    DIVERTICULOSIS, COLON 08/05/2007   Qualifier: Diagnosis of  By: Autry Legions  MD, Alveda Aures    ECZEMA, ATOPIC 11/12/2009   Qualifier: Diagnosis of  By: Rochelle Chu MD, Randa Burton.    HEMORRHOIDS 07/10/2008   Qualifier: Diagnosis of  By: Autry Legions MD, Alveda Aures    History of nephrolithiasis    History of shingles    Hx of colonic polyp    Hyperglycemia 11/19/2014   Hyperlipidemia    Hypertension    some past history    Insomnia    Insomnia, unspecified 04/08/2007   Centricity Description: SYMPTOM, INSOMNIA NOS Qualifier: Diagnosis of  By: Autry Legions MD, Alveda Aures  Centricity Description: INSOMNIA-SLEEP DISORDER-UNSPEC Qualifier: Diagnosis of  By: Autry Legions MD, Alveda Aures    KNEE PAIN, LEFT 07/10/2008   Qualifier: Diagnosis of  By: Autry Legions MD, Alveda Aures    Libido, decreased 10/04/2011   Loss of weight 05/30/2014   Mitral regurgitation    mild to moderate by echo 04/2023   Multinodular goiter 12/14/2016   Nausea in adult patient 05/30/2014   NEPHROLITHIASIS, HX OF 08/05/2007   Qualifier: Diagnosis of  By: Autry Legions MD, Alveda Aures    RENAL INSUFFICIENCY 09/07/2010   Qualifier: Diagnosis of  By: Autry Legions MD, Alveda Aures    Right leg pain 10/04/2018   Stroke (HCC) 10/2014   TIA (transient ischemic attack)    Vitiligo 10/31/2012   Past Surgical History:  Procedure Laterality Date   CERVICAL DISC ARTHROPLASTY  12/08/2017   COLONOSCOPY     CYSTOSCOPY WITH URETEROSCOPY AND STENT PLACEMENT Left 07/04/2014   Procedure: CYSTOSCOPY WITH LEFT RETROGRADE PYLEOGRAM, LEFT URETERAL DILATION AND URETEROSCOPY, LASER LITHOTRISPY AND STENT PLACEMENT;  Surgeon: Andrez Banker, MD;  Location: [facility];  Service: Urology;  Laterality: Left;   HOLMIUM LASER APPLICATION N/A 07/04/2014   Procedure:  LASER LITHOTRIPSY;  Surgeon: Andrez Banker, MD;  Location: [facility];  Service: Urology;  Laterality: N/A;   lithotrypsy     x 3   POLYPECTOMY     TONSILLECTOMY     Family History  Problem Relation Age of Onset   Ulcerative colitis Sister    Colitis Sister    Heart disease Other        Grandparents   Hyperlipidemia Other     Hypertension Other    Heart failure Mother    CAD Mother        died of CAD at age 84   Heart failure Father    CAD Father        died from cardiac arrest at 69   Lupus Paternal Grandfather    Heart failure Maternal Grandfather    Colon cancer Neg Hx    Esophageal cancer Neg Hx    Pancreatic cancer Neg Hx    Stomach cancer Neg Hx    Liver disease  Neg Hx    Colon polyps Neg Hx    Rectal cancer Neg Hx    Diabetes Neg Hx    Social History   Socioeconomic History   Marital status: Single    Spouse name: Not on file   Number of children: Not on file   Years of education: Not on file   Highest education level: Bachelor's degree (e.g., BA, AB, BS)  Occupational History   Occupation: IT  Tobacco Use   Smoking status: Former    Current packs/day: 0.50    Average packs/day: 0.5 packs/day for 10.0 years (5.0 ttl pk-yrs)    Types: Cigarettes    Passive exposure: Past   Smokeless tobacco: Never   Tobacco comments:    Regular exercise - No  Vaping Use   Vaping status: Never Used  Substance and Sexual Activity   Alcohol use: Not Currently    Alcohol/week: 7.0 standard drinks of alcohol    Types: 7 Cans of beer per week   Drug use: No   Sexual activity: Never  Other Topics Concern   Not on file  Social History Narrative   Works at [employer], divorce, no biological children   Social Drivers of Corporate investment banker Strain: Low Risk  (01/09/2024)   Overall Financial Resource Strain (CARDIA)    Difficulty of Paying Living Expenses: Not hard at all  Food Insecurity: No Food Insecurity (01/09/2024)   Hunger Vital Sign    Worried About Running Out of Food in the Last Year: Never true    Ran Out of Food in the Last Year: Never true  Transportation Needs: No Transportation Needs (01/09/2024)   PRAPARE - Administrator, Civil Service (Medical): No    Lack of Transportation (Non-Medical): No  Physical Activity: Insufficiently Active (01/09/2024)   Exercise Vital Sign     Days of Exercise per Week: 4 days    Minutes of Exercise per Session: 30 min  Stress: No Stress Concern Present (01/09/2024)   [employer] of Occupational Health - Occupational Stress Questionnaire    Feeling of Stress : Only a little  Social Connections: Socially Isolated (01/09/2024)   Social Connection and Isolation Panel [NHANES]    Frequency of Communication with Friends and Family: More than three times a week    Frequency of Social Gatherings with Friends and Family: Never    Attends Religious Services: Never    Database administrator or Organizations: No    Attends Engineer, structural: Never    Marital Status: Divorced    Tobacco Counseling Counseling given: No Tobacco comments: Regular exercise - No    Clinical Intake:  Pre-visit preparation completed: Yes  Pain : No/denies pain     BMI - recorded: 22.39 Nutritional Risks: None Diabetes: No  Lab Results  Component Value Date   HGBA1C 5.8 10/31/2023   HGBA1C 5.9 12/21/2022   HGBA1C 5.9 04/26/2022     How often do you need to have someone help you when you read instructions, pamphlets, or other written materials from your doctor or pharmacy?: 1 - Never  Interpreter Needed?: No  Information entered by :: Kandy Orris, CMA   Activities of Daily Living     01/09/2024    3:44 PM 01/09/2024    4:49 AM  In your present state of health, do you have any difficulty performing the following activities:  Hearing? 0 0  Vision? 0 0  Difficulty concentrating or making decisions? 0  Walking or climbing stairs? 0 0  Dressing or bathing? 0 0  Doing errands, shopping? 0 0  Preparing Food and eating ? N N  Using the Toilet? N N  In the past six months, have you accidently leaked urine? Y Y  Do you have problems with loss of bowel control? N N  Managing your Medications? N N  Managing your Finances? N N  Housekeeping or managing your Housekeeping? N N    Patient Care Team: Roslyn Coombe, MD as  PCP - General Jacqueline Matsu, MD as PCP - Cardiology (Cardiology) [facility], [place] as Consulting Physician (Optometry)  Indicate any recent Medical Services you may have received from other than [facility] providers in the past year (date may be approximate).     Assessment:   This is a routine wellness examination for Jerry Rios.  Hearing/Vision screen Hearing Screening - Comments:: Wears a left ear hearing aid - referral to an Audiologist (difficulty hearing) Vision Screening - Comments:: Wears rx glasses - up to date with routine eye exams with [facility]   Goals Addressed               This Visit's Progress     Patient Stated (pt-stated)        Patient stated he plans to try to exercise at the [facility] more than 1x a week       Depression Screen     01/09/2024    3:50 PM 10/26/2023   10:33 AM 09/05/2022    2:55 PM 04/26/2022    8:11 AM 10/25/2021    2:49 PM 10/25/2021    2:04 PM 10/23/2020    2:45 PM  PHQ 2/9 Scores  PHQ - 2 Score 0 0 0 0 0 0 0  PHQ- 9 Score 1   2       Fall Risk     01/09/2024    3:47 PM 01/09/2024    4:49 AM 10/26/2023   10:33 AM 12/13/2022    8:50 AM 09/05/2022    2:51 PM  Fall Risk   Falls in the past year? 0 1 0 1 0  Comment no falls per pt      Number falls in past yr: 0  0 0 0  Injury with Fall? 0  0 0 0  Risk for fall due to : No Fall Risks  No Fall Risks Impaired balance/gait No Fall Risks  Follow up Falls prevention discussed;Falls evaluation completed  Falls evaluation completed Education provided;Falls evaluation completed Falls prevention discussed    MEDICARE RISK AT HOME:  Medicare Risk at Home Any stairs in or around the home?: Yes If so, are there any without handrails?: Yes Home free of loose throw rugs in walkways, pet beds, electrical cords, etc?: Yes Adequate lighting in your home to reduce risk of falls?: Yes Life alert?: No Use of a cane, walker or w/c?: No Grab bars in the bathroom?: No Shower chair or bench in  shower?: No Elevated toilet seat or a handicapped toilet?: No  TIMED UP AND GO:  Was the test performed?  No  Cognitive Function: 6CIT completed        01/09/2024    3:47 PM 09/05/2022    3:07 PM  6CIT Screen  What Year? 0 points 0 points  What month? 0 points 0 points  What time? 0 points 0 points  Count back from 20 0 points 0 points  Months in reverse 0 points  0 points  Repeat phrase 0 points 0 points  Total Score 0 points 0 points    Immunizations Immunization History  Administered Date(s) Administered   Fluad Quad(high Dose 65+) 06/08/2019   Fluad Trivalent(High Dose 65+) 06/27/2023   Influenza Whole 06/26/2009   Influenza, Seasonal, Injecte, Preservative Fre 06/26/2013   Influenza,inj,Quad PF,6+ Mos 09/27/2019   Influenza-Unspecified 06/08/2017, 08/10/2018, 07/02/2020, 08/04/2021, 06/17/2022   Moderna Covid-19 Fall Seasonal Vaccine 58yrs & older 06/24/2022   PFIZER(Purple Top)SARS-COV-2 Vaccination 12/11/2019, 12/31/2019, 07/23/2020, 01/17/2021, 06/08/2021   Pfizer(Comirnaty)Fall Seasonal Vaccine 12 years and older 06/08/2022, 06/20/2023   Pneumococcal Conjugate-13 12/14/2016   Pneumococcal Polysaccharide-23 10/04/2018   Respiratory Syncytial Virus Vaccine,Recomb Aduvanted(Arexvy) 09/06/2022   Td 09/27/1995, 07/13/2009   Tdap 10/08/2019   Zoster, Live 09/07/2010    Screening Tests Health Maintenance  Topic Date Due   INFLUENZA VACCINE  04/26/2024   Medicare Annual Wellness (AWV)  01/08/2025   Colonoscopy  01/04/2027   DTaP/Tdap/Td (4 - Td or Tdap) 10/07/2029   Pneumonia Vaccine 47+ Years old  Completed   Hepatitis C Screening  Completed   HPV VACCINES  Aged Out   Meningococcal B Vaccine  Aged Out   COVID-19 Vaccine  Discontinued   Zoster Vaccines- Shingrix  Discontinued    Health Maintenance  There are no preventive care reminders to display for this patient.  Health Maintenance Items Addressed: 01/09/2024   Additional Screening:  Vision  Screening: Recommended annual ophthalmology exams for early detection of glaucoma and other disorders of the eye.  Dental Screening: Recommended annual dental exams for proper oral hygiene  Community Resource Referral / Chronic Care Management: CRR required this visit?  No   CCM required this visit?  No     Plan:     I have personally reviewed and noted the following in the patient's chart:   Medical and social history Use of alcohol, tobacco or illicit drugs  Current medications and supplements including opioid prescriptions. Patient is not currently taking opioid prescriptions. Functional ability and status Nutritional status Physical activity Advanced directives List of other physicians Hospitalizations, surgeries, and ER visits in previous 12 months Vitals Screenings to include cognitive, depression, and falls Referrals and appointments  In addition, I have reviewed and discussed with patient certain preventive protocols, quality metrics, and best practice recommendations. A written personalized care plan for preventive services as well as general preventive health recommendations were provided to patient.     Patria Bookbinder, CMA   01/09/2024   After Visit Summary: (In Person-Declined) Patient declined AVS at this time.  Notes: Nothing significant to report at this time.

## 2024-01-09 NOTE — Telephone Encounter (Signed)
 Pt is requesting additional MRI orders of the cervical spine so when he goes for the lumbar scan he get both on the same day

## 2024-01-10 NOTE — Telephone Encounter (Signed)
 Oh sorry, normally that is not possible, due to the high cost of the MRIs, scheduling issues to get it done, and often the insurance will deny it if this was not ordered during an office exam to document the need for this now.     thanks

## 2024-01-22 ENCOUNTER — Encounter: Payer: Self-pay | Admitting: Internal Medicine

## 2024-01-24 ENCOUNTER — Encounter: Payer: Self-pay | Admitting: Internal Medicine

## 2024-01-25 ENCOUNTER — Ambulatory Visit
Admission: RE | Admit: 2024-01-25 | Discharge: 2024-01-25 | Disposition: A | Discharge status: Home / Self Care | Source: Ambulatory Visit | Attending: Internal Medicine | Admitting: Internal Medicine

## 2024-01-25 DIAGNOSIS — M5416 Radiculopathy, lumbar region: Secondary | ICD-10-CM

## 2024-02-20 ENCOUNTER — Ambulatory Visit: Payer: Self-pay | Admitting: Internal Medicine

## 2024-03-28 ENCOUNTER — Other Ambulatory Visit: Payer: Self-pay | Admitting: Neurological Surgery

## 2024-03-28 DIAGNOSIS — M4722 Other spondylosis with radiculopathy, cervical region: Secondary | ICD-10-CM

## 2024-04-02 ENCOUNTER — Encounter: Payer: Medicare Other | Admitting: *Deleted

## 2024-04-02 VITALS — BP 98/61 | HR 50 | Temp 98.1°F | Resp 16 | Wt 133.0 lb

## 2024-04-02 DIAGNOSIS — Z006 Encounter for examination for normal comparison and control in clinical research program: Secondary | ICD-10-CM

## 2024-04-02 MED ORDER — STUDY - ORION 4 - INCLISIRAN 300 MG/1.5 ML OR PLACEBO SQ INJECTION (PI-STUCKEY)
300.0000 mg | INJECTION | SUBCUTANEOUS | Status: DC
  Administered 2024-04-02: 300 mg via SUBCUTANEOUS
  Filled 2024-04-02: qty 1.5

## 2024-04-02 NOTE — Research (Signed)
 Orion 4  Patient doing well Meds reviewed  IP given   Suzen Hardy :) RN BSN  Clinical Research Nurse  Be strong and take heart, all you who hope in the Lord. ~ Psalm 31:24

## 2024-04-09 ENCOUNTER — Ambulatory Visit
Admission: RE | Admit: 2024-04-09 | Discharge: 2024-04-09 | Disposition: A | Discharge status: Home / Self Care | Source: Ambulatory Visit | Attending: Neurological Surgery | Admitting: Neurological Surgery

## 2024-04-09 DIAGNOSIS — M4722 Other spondylosis with radiculopathy, cervical region: Secondary | ICD-10-CM

## 2024-04-10 ENCOUNTER — Telehealth: Payer: Self-pay

## 2024-04-10 DIAGNOSIS — I340 Nonrheumatic mitral (valve) insufficiency: Secondary | ICD-10-CM

## 2024-04-10 NOTE — Telephone Encounter (Signed)
-----   Message from Olam ORN sent at 04/10/2024  8:44 AM EDT ----- Please create an new order for the Echocardiogram ordered due to order has expired before appointment could be made.   Thank you.

## 2024-05-17 ENCOUNTER — Ambulatory Visit (HOSPITAL_COMMUNITY)
Admission: RE | Admit: 2024-05-17 | Discharge: 2024-05-17 | Disposition: A | Discharge status: Home / Self Care | Source: Ambulatory Visit | Attending: Cardiology | Admitting: Cardiology

## 2024-05-17 DIAGNOSIS — I340 Nonrheumatic mitral (valve) insufficiency: Secondary | ICD-10-CM | POA: Insufficient documentation

## 2024-05-17 LAB — ECHOCARDIOGRAM COMPLETE
Area-P 1/2: 3.05 cm2
S' Lateral: 3 cm

## 2024-05-19 ENCOUNTER — Encounter: Payer: Self-pay | Admitting: Cardiology

## 2024-05-19 ENCOUNTER — Ambulatory Visit: Payer: Self-pay | Admitting: Cardiology

## 2024-05-21 ENCOUNTER — Ambulatory Visit: Payer: Self-pay

## 2024-05-21 NOTE — Telephone Encounter (Signed)
  FYI Only or Action Required?: FYI only for provider.  Patient was last seen in primary care on 10/26/2023 by Norleen Lynwood ORN, MD.  Called Nurse Triage reporting Insect Bite.  Symptoms began today.  Interventions attempted: Nothing.  Symptoms are: gradually improving.  Triage Disposition: Home Care  Patient/caregiver understands and will follow disposition?: Yes    Copied from CRM [id number]. Topic: Clinical - Red Word Triage >> May 21, 2024  3:35 PM Sasha M wrote: Red Word that prompted transfer to Nurse Triage: Pt was bitten by insect while doing yardwork and rt thumb is now painful and swollen. !5 mins ago Reason for Disposition  Finger pain  Answer Assessment - Initial Assessment Questions 1. ONSET: When did the pain start?      Today  2. LOCATION and RADIATION: Where is the pain located?  (e.g., fingertip, around nail, joint, entire      Right thumb on the ventral side right toward the center, more towards the tip 3. SEVERITY: How bad is the pain? What does it keep you from doing?   (Scale 1-10; or mild, moderate, severe)     2/10 4. APPEARANCE: What does the finger look like? (e.g., redness, swelling, bruising, pallor)     A little puffy and slightly red 5. WORK OR EXERCISE: Has there been any recent work or exercise that involved this part (i.e., fingers or hand) of the body?     no 6. CAUSE: What do you think is causing the pain?     Doesn't know if insect bite he hit it on some 7. AGGRAVATING FACTORS: What makes the pain worse? (e.g., using computer)     no 8. OTHER SYMPTOMS: Do you have any other symptoms? (e.g., fever, neck pain, numbness)     Unknown if its numb state he can't fully flex it.  Protocols used: Finger Pain-A-AH

## 2024-07-09 ENCOUNTER — Telehealth: Payer: Self-pay

## 2024-07-09 DIAGNOSIS — H9192 Unspecified hearing loss, left ear: Secondary | ICD-10-CM

## 2024-07-09 NOTE — Telephone Encounter (Signed)
 Ok this referral is done   thanks

## 2024-07-09 NOTE — Telephone Encounter (Signed)
 Copied from CRM [id number]. Topic: Referral - Request for Referral >> Jul 09, 2024 12:13 PM Jasmin G wrote: Did the patient discuss referral with their provider in the last year? Yes (If No - schedule appointment) (If Yes - send message)  Appointment offered? No  Type of order/referral and detailed reason for visit: Audiologist  Preference of office, provider, location: Not discussed  If referral order, have you been seen by this specialty before? No (If Yes, this issue or another issue? When? Where?  Can we respond through MyChart? Yes

## 2024-08-02 ENCOUNTER — Telehealth: Payer: Self-pay

## 2024-08-02 NOTE — Telephone Encounter (Signed)
 Copied from CRM #[id number]. Topic: Appointments - Scheduling Inquiry for Clinic >> Aug 02, 2024  9:17 AM Vena HERO wrote: Reason for CRM: Pt has a question for Dr Norleen regarding future care. He would like to know if an internal medicine provider vs a family medicine provider would be a good fit for his care. He is considering his TOC between Dr Jordan and Dr Theophilus at [place] but isn't sure which specialty would be better suited. Please call pt at [phone] or via mychart

## 2024-08-06 ENCOUNTER — Telehealth: Payer: Self-pay | Admitting: *Deleted

## 2024-08-06 NOTE — Telephone Encounter (Signed)
 Copied from CRM [id number]. Topic: Appointments - Transfer of Care >> Aug 06, 2024 12:39 PM Suzen RAMAN wrote: Pt is requesting to transfer FROM: Lynwood Rush Pt is requesting to transfer TO: Estela Hernandez-Acosta Reason for requested transfer: PCP retiring It is the responsibility of the team the patient would like to transfer to (Dr. Tully Ly) to reach out to the patient if for any reason this transfer is not acceptable.

## 2024-08-07 NOTE — Telephone Encounter (Signed)
Ok with me. thanks 

## 2024-08-08 ENCOUNTER — Other Ambulatory Visit: Payer: Self-pay

## 2024-08-08 ENCOUNTER — Other Ambulatory Visit: Payer: Self-pay | Admitting: Internal Medicine

## 2024-08-12 ENCOUNTER — Ambulatory Visit: Payer: Self-pay | Admitting: Audiologist

## 2024-09-16 ENCOUNTER — Ambulatory Visit: Payer: Self-pay

## 2024-09-16 ENCOUNTER — Telehealth: Payer: Self-pay | Admitting: Gastroenterology

## 2024-09-16 NOTE — Telephone Encounter (Signed)
 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 Onset x 1 month with rectal pain after BM's. He is requesting the diltiazem  2% refill be sent to [facility] if approved. I told him I will route this message to Dr Shila and call him back tomorrow when she answers me back.

## 2024-09-16 NOTE — Telephone Encounter (Signed)
 FYI Only or Action Required?: Action required by provider: clinical question for provider and update on patient condition.  Patient was last seen in primary care on 10/26/2023 by Norleen Lynwood ORN, MD.  Called Nurse Triage reporting Blister.  Symptoms began today.  Interventions attempted: Nothing.  Symptoms are: stable.  Triage Disposition: Home Care  Patient/caregiver understands and will follow disposition?: Yes   Copied from CRM [id number]. Topic: Clinical - Red Word Triage >> Sep 16, 2024  5:36 PM Alfonso ORN wrote: Red Word that prompted transfer to Nurse Triage: blood blister burst under tongue Reason for Disposition  Canker sore(s) suspected (e.g., 1 to 3 painful white shallow ulcers; no fever, no recent cancer treatment)  Answer Assessment - Initial Assessment Questions Pt called in to report blood blister/ oral ulcer under his tongue. Pt reports no hx of blisters or oral ulcers. Pt thought he had a piece of food stuck under his tongue so he was brushing his teeth when he notice blood in the toothpaste. Reports he found a blister under his tongue; bleeding has since stopped. Pt denies any other symptoms or sores present. Discussed appt vs. Home care. Pt elects to do home care with non alcohol based mouthwash and salt water gargles. Pt to f/u if blister re-forms or further issues develop.      1. LOCATION: Where is the mouth sore (ulcer) located?      Under tongue   2. NUMBER: How many sores are there? :     1  3. SIZE: How large is the sore?  (e.g., size of an apple seed, watermelon seed, pencil eraser)     Unknown; states he thought it was a piece of food so he brushed his teeth then notice blood when he spit his toothpaste and saw blister under his tongue   4. PAIN: Are they painful? If Yes, ask: How bad is it?  (Scale 0-10; or none, mild, moderate, severe)     No   5. ONSET: When did you first notice the sore?      Today, 20 minutes ago   6. RECURRENT  SYMPTOM: Have you had a mouth ulcer before? If Yes, ask: When was the last time? and What happened that time?      No   7. CAUSE: What do you think is causing the mouth sore?     Unknown   8. OTHER SYMPTOMS: Do you have any other symptoms? (e.g., fever, swollen lymph node)     No  Protocols used: Mouth Ulcers-A-AH

## 2024-09-16 NOTE — Telephone Encounter (Signed)
 Inbound call from patient stating he is scheduled for a follow up on 10/22/24 but is needing a refill on medication diltiazem  2% gel  Please advise  Thank you

## 2024-09-17 MED ORDER — DILTIAZEM GEL 2 %: 1.0000 | Freq: Two times a day (BID) | CUTANEOUS | 1 refills | Status: AC

## 2024-09-17 NOTE — Telephone Encounter (Signed)
 I agree, ok for mouthwash and salt water gargles.  Follow up if persists or worsens.  thanks

## 2024-09-17 NOTE — Telephone Encounter (Signed)
 Okay to send refill for diltiazem .  Thank you

## 2024-09-17 NOTE — Telephone Encounter (Signed)
 Refill for diltiazem  printed/signed and faxed to [facility]. Patient informed and said thank you.

## 2024-10-01 ENCOUNTER — Encounter: Admitting: *Deleted

## 2024-10-01 DIAGNOSIS — Z006 Encounter for examination for normal comparison and control in clinical research program: Secondary | ICD-10-CM

## 2024-10-01 MED ORDER — STUDY - ORION 4 - INCLISIRAN 300 MG/1.5 ML OR PLACEBO SQ INJECTION (PI-CHRISTOPHER)
300.0000 mg | INJECTION | SUBCUTANEOUS | Status: AC
  Administered 2024-10-01: 300 mg via SUBCUTANEOUS
  Filled 2024-10-01: qty 1.5

## 2024-10-01 NOTE — Research (Signed)
 ORION 4  Patient doing well, no complaints.  No med changes per patient and reviewed  Will see patient back in April 01, 2025 for his last visit with study.  Closer to that time I will check with cardiologist to see what next steps for patient will be concerning lipid treatment   Suzen Hardy :) RN BSN  Clinical Research Nurse  Be strong and take heart, all you who hope in the Lord. ~ Psalm 31:24

## 2024-10-15 ENCOUNTER — Telehealth: Payer: Self-pay

## 2024-10-15 NOTE — Telephone Encounter (Signed)
 Copied from CRM #[id number]. Topic: General - Other >> Oct 14, 2024  4:30 PM Aisha D wrote: Reason for CRM: Pt is currently on the wait list for a sooner TOC appt with Dr.Hernandez. Pt wants to know if he is able to get a notification on his phone when a sooner appt opens up instead of email.

## 2024-10-15 NOTE — Telephone Encounter (Signed)
Ok to forward, thanks

## 2024-10-21 ENCOUNTER — Encounter: Admitting: Internal Medicine

## 2024-10-22 ENCOUNTER — Ambulatory Visit: Admitting: Gastroenterology

## 2024-10-28 ENCOUNTER — Ambulatory Visit: Admitting: Gastroenterology

## 2024-11-13 ENCOUNTER — Encounter: Admitting: Internal Medicine

## 2024-12-05 ENCOUNTER — Encounter: Admitting: Internal Medicine

## 2024-12-11 ENCOUNTER — Encounter: Admitting: Internal Medicine

## 2025-01-13 ENCOUNTER — Ambulatory Visit

## 2025-01-13 ENCOUNTER — Encounter: Admitting: Internal Medicine

## 2025-04-01 ENCOUNTER — Encounter
